# Patient Record
Sex: Female | Born: 1935
Health system: Southern US, Community
[De-identification: ages and names within clinical notes are randomized; demographics above are authoritative.]

## PROBLEM LIST (undated history)

## (undated) DIAGNOSIS — H409 Unspecified glaucoma: Secondary | ICD-10-CM

## (undated) DIAGNOSIS — D649 Anemia, unspecified: Secondary | ICD-10-CM

## (undated) DIAGNOSIS — R634 Abnormal weight loss: Secondary | ICD-10-CM

## (undated) DIAGNOSIS — R5383 Other fatigue: Secondary | ICD-10-CM

## (undated) DIAGNOSIS — K5790 Diverticulosis of intestine, part unspecified, without perforation or abscess without bleeding: Secondary | ICD-10-CM

## (undated) DIAGNOSIS — R0600 Dyspnea, unspecified: Secondary | ICD-10-CM

## (undated) DIAGNOSIS — F419 Anxiety disorder, unspecified: Secondary | ICD-10-CM

## (undated) DIAGNOSIS — R0681 Apnea, not elsewhere classified: Secondary | ICD-10-CM

## (undated) DIAGNOSIS — K219 Gastro-esophageal reflux disease without esophagitis: Secondary | ICD-10-CM

## (undated) DIAGNOSIS — G4733 Obstructive sleep apnea (adult) (pediatric): Secondary | ICD-10-CM

## (undated) DIAGNOSIS — I10 Essential (primary) hypertension: Secondary | ICD-10-CM

## (undated) DIAGNOSIS — M810 Age-related osteoporosis without current pathological fracture: Secondary | ICD-10-CM

## (undated) DIAGNOSIS — J383 Other diseases of vocal cords: Secondary | ICD-10-CM

## (undated) DIAGNOSIS — J45909 Unspecified asthma, uncomplicated: Secondary | ICD-10-CM

## (undated) DIAGNOSIS — E785 Hyperlipidemia, unspecified: Secondary | ICD-10-CM

## (undated) DIAGNOSIS — J3800 Paralysis of vocal cords and larynx, unspecified: Secondary | ICD-10-CM

## (undated) HISTORY — DX: Dyspnea, unspecified: R06.00

## (undated) HISTORY — DX: Hyperlipidemia, unspecified: E78.5

## (undated) HISTORY — DX: Gastro-esophageal reflux disease without esophagitis: K21.9

## (undated) HISTORY — DX: Anemia, unspecified: D64.9

## (undated) HISTORY — DX: Paralysis of vocal cords and larynx, unspecified: J38.00

## (undated) HISTORY — PX: TONSILLECTOMY: SUR1361

## (undated) HISTORY — DX: Anxiety disorder, unspecified: F41.9

## (undated) HISTORY — DX: Diverticulosis of intestine, part unspecified, without perforation or abscess without bleeding: K57.90

## (undated) HISTORY — DX: Other diseases of vocal cords: J38.3

## (undated) HISTORY — PX: SKIN CANCER EXCISION: SHX779

## (undated) HISTORY — DX: Age-related osteoporosis without current pathological fracture: M81.0

## (undated) HISTORY — DX: Obstructive sleep apnea (adult) (pediatric): G47.33

## (undated) HISTORY — DX: Abnormal weight loss: R63.4

## (undated) HISTORY — DX: Unspecified glaucoma: H40.9

## (undated) HISTORY — DX: Unspecified asthma, uncomplicated: J45.909

## (undated) HISTORY — PX: CERVICAL CONIZATION W/BX: SHX1330

## (undated) HISTORY — DX: Essential (primary) hypertension: I10

## (undated) HISTORY — DX: Apnea, not elsewhere classified: R06.81

## (undated) HISTORY — DX: Other fatigue: R53.83

---

## 1999-11-05 HISTORY — PX: OTHER SURGICAL HISTORY: SHX169

## 2000-01-03 LAB — HM SIGMOIDOSCOPY

## 2000-01-09 LAB — HM SIGMOIDOSCOPY

## 2000-05-04 DIAGNOSIS — R0681 Apnea, not elsewhere classified: Secondary | ICD-10-CM

## 2000-05-04 HISTORY — PX: UPPER GI ENDOSCOPY: SHX6162

## 2000-05-04 HISTORY — DX: Apnea, not elsewhere classified: R06.81

## 2000-05-08 ENCOUNTER — Other Ambulatory Visit: Admission: RE | Admit: 2000-05-08 | Discharge: 2000-05-08 | Payer: Self-pay | Admitting: Internal Medicine

## 2000-06-18 ENCOUNTER — Ambulatory Visit: Admission: RE | Admit: 2000-06-18 | Discharge: 2000-06-18 | Payer: Self-pay | Admitting: Internal Medicine

## 2001-11-04 HISTORY — PX: OTHER SURGICAL HISTORY: SHX169

## 2002-02-02 HISTORY — PX: COLONOSCOPY: SHX174

## 2002-02-02 LAB — HM COLONOSCOPY

## 2009-08-09 LAB — BASIC METABOLIC PANEL
BUN: 21 mg/dL (ref 4–21)
CREATININE: 1 mg/dL (ref 0.5–1.1)
Glucose: 98 mg/dL
POTASSIUM: 3.9 mmol/L (ref 3.4–5.3)
SODIUM: 140 mmol/L (ref 137–147)

## 2009-08-09 LAB — HEPATIC FUNCTION PANEL
ALT: 14 U/L (ref 7–35)
AST: 26 U/L (ref 13–35)
Alkaline Phosphatase: 90 U/L (ref 25–125)
BILIRUBIN, TOTAL: 0.5 mg/dL

## 2009-08-09 LAB — LIPID PANEL
Cholesterol: 217 mg/dL — AB (ref 0–200)
HDL: 98 mg/dL — AB (ref 35–70)
LDL Cholesterol: 103 mg/dL
TRIGLYCERIDES: 78 mg/dL (ref 40–160)

## 2009-08-09 LAB — CBC AND DIFFERENTIAL
HCT: 42 % (ref 36–46)
Hemoglobin: 13.9 g/dL (ref 12.0–16.0)
Platelets: 165 10*3/uL (ref 150–399)
WBC: 4.8 10*3/mL

## 2009-11-04 HISTORY — PX: CATARACT EXTRACTION W/ INTRAOCULAR LENS  IMPLANT, BILATERAL: SHX1307

## 2009-12-15 DIAGNOSIS — R0600 Dyspnea, unspecified: Secondary | ICD-10-CM

## 2009-12-15 HISTORY — DX: Dyspnea, unspecified: R06.00

## 2012-01-06 ENCOUNTER — Encounter: Payer: Self-pay | Admitting: Internal Medicine

## 2012-02-12 ENCOUNTER — Encounter: Payer: Self-pay | Admitting: Internal Medicine

## 2012-02-13 LAB — HM MAMMOGRAPHY: HM MAMMO: NEGATIVE

## 2012-04-03 ENCOUNTER — Encounter: Payer: Self-pay | Admitting: *Deleted

## 2012-04-08 ENCOUNTER — Encounter: Payer: Self-pay | Admitting: Internal Medicine

## 2012-04-22 ENCOUNTER — Ambulatory Visit: Payer: Self-pay | Admitting: Internal Medicine

## 2012-06-03 DIAGNOSIS — Z85828 Personal history of other malignant neoplasm of skin: Secondary | ICD-10-CM

## 2012-06-03 DIAGNOSIS — Z8709 Personal history of other diseases of the respiratory system: Secondary | ICD-10-CM | POA: Insufficient documentation

## 2012-06-03 DIAGNOSIS — H01009 Unspecified blepharitis unspecified eye, unspecified eyelid: Secondary | ICD-10-CM | POA: Insufficient documentation

## 2012-06-03 DIAGNOSIS — H3530 Unspecified macular degeneration: Secondary | ICD-10-CM | POA: Insufficient documentation

## 2012-06-03 DIAGNOSIS — Z961 Presence of intraocular lens: Secondary | ICD-10-CM | POA: Insufficient documentation

## 2012-06-03 HISTORY — DX: Personal history of other malignant neoplasm of skin: Z85.828

## 2012-06-03 HISTORY — DX: Personal history of other diseases of the respiratory system: Z87.09

## 2012-06-03 HISTORY — DX: Presence of intraocular lens: Z96.1

## 2012-06-25 ENCOUNTER — Encounter: Payer: Self-pay | Admitting: Internal Medicine

## 2012-06-25 ENCOUNTER — Ambulatory Visit (INDEPENDENT_AMBULATORY_CARE_PROVIDER_SITE_OTHER): Payer: Medicare Other | Admitting: Internal Medicine

## 2012-06-25 VITALS — Ht 65.0 in | Wt 137.0 lb

## 2012-06-25 DIAGNOSIS — Z1211 Encounter for screening for malignant neoplasm of colon: Secondary | ICD-10-CM

## 2012-06-25 NOTE — Patient Instructions (Addendum)
Follow up as needed with Dr Juanda Chance.

## 2012-06-25 NOTE — Progress Notes (Signed)
Angela [name] 15-Mar-1927 MRN [id number]   History of Present Illness:  This is an 78 year old white female who is here to discuss a screening colonoscopy. Her last exam in April 2003 showed mild diverticulosis of the sigmoid colon. She has a history of asthma and difficult intubation. She denies any GI symptoms. There is no family history of colon cancer.   Past Medical History  Diagnosis Date  . GERD (gastroesophageal reflux disease)   . Diverticulosis   . Vocal cord dysfunction     per ENT, pt NOT to have elective intubation without disscussing with him  . OSA (obstructive sleep apnea)   . Asthma   . Anxiety   . Hyperlipidemia   . Hypertension    Past Surgical History  Procedure Date  . Tonsillectomy   . Cervix surgery     due to cervical dysplasia  . Paralyzed larynx     does not have a smoking history on file. Her smokeless tobacco use includes Snuff. She reports that she does not drink alcohol or use illicit drugs. family history includes Melanoma in her mother.  There is no history of Colon cancer. No Known Allergies      Review of Systems: Weight has been stable. Negative for dysphagia odynophagia  The remainder of the 10 point ROS is negative except as outlined in H&P   Physical Exam: General appearance  Well developed, in no distress. Eyes- non icteric. HEENT nontraumatic, normocephalic. Mouth no lesions, tongue papillated, no cheilosis. Neck supple without adenopathy, thyroid not enlarged, no carotid bruits, no JVD. Lungs Clear to auscultation bilaterally. Cor normal S1, normal S2, regular rhythm, no murmur,  quiet precordium. Abdomen: Soft nontender abdomen with normoactive bowel sounds. No distention. Rectal: Soft Hemoccult negative stool. Extremities no pedal edema. Skin no lesions. Neurological alert and oriented x 3. Psychological normal mood and affect.  Assessment and Plan:  Problem #74 78 year old white female who is due for a screening  colonoscopy. She is in good health but has a history of obstructive sleep apnea and asthmatic bronchitis as well as difficult intubation. At this point, the risks of colonoscopy exceed the benefits. We have mutually agreed on not pursuing a routine screening colonoscopy.   06/25/2012 Angela [name]

## 2013-10-12 DIAGNOSIS — H02409 Unspecified ptosis of unspecified eyelid: Secondary | ICD-10-CM | POA: Insufficient documentation

## 2013-11-11 DIAGNOSIS — H02009 Unspecified entropion of unspecified eye, unspecified eyelid: Secondary | ICD-10-CM | POA: Diagnosis not present

## 2013-11-11 DIAGNOSIS — H16219 Exposure keratoconjunctivitis, unspecified eye: Secondary | ICD-10-CM | POA: Diagnosis not present

## 2013-11-11 DIAGNOSIS — H02429 Myogenic ptosis of unspecified eyelid: Secondary | ICD-10-CM | POA: Insufficient documentation

## 2013-11-11 DIAGNOSIS — H02049 Spastic entropion of unspecified eye, unspecified eyelid: Secondary | ICD-10-CM | POA: Diagnosis not present

## 2013-11-11 HISTORY — DX: Myogenic ptosis of unspecified eyelid: H02.429

## 2013-11-12 DIAGNOSIS — J40 Bronchitis, not specified as acute or chronic: Secondary | ICD-10-CM | POA: Diagnosis not present

## 2013-11-25 DIAGNOSIS — J209 Acute bronchitis, unspecified: Secondary | ICD-10-CM | POA: Diagnosis not present

## 2013-12-02 DIAGNOSIS — Z79899 Other long term (current) drug therapy: Secondary | ICD-10-CM | POA: Diagnosis not present

## 2013-12-02 DIAGNOSIS — D649 Anemia, unspecified: Secondary | ICD-10-CM | POA: Diagnosis not present

## 2013-12-02 DIAGNOSIS — E559 Vitamin D deficiency, unspecified: Secondary | ICD-10-CM | POA: Diagnosis not present

## 2013-12-02 DIAGNOSIS — E782 Mixed hyperlipidemia: Secondary | ICD-10-CM | POA: Diagnosis not present

## 2013-12-02 DIAGNOSIS — I10 Essential (primary) hypertension: Secondary | ICD-10-CM | POA: Diagnosis not present

## 2013-12-26 DIAGNOSIS — H168 Other keratitis: Secondary | ICD-10-CM

## 2013-12-26 DIAGNOSIS — H02049 Spastic entropion of unspecified eye, unspecified eyelid: Secondary | ICD-10-CM

## 2013-12-26 HISTORY — DX: Spastic entropion of unspecified eye, unspecified eyelid: H02.049

## 2013-12-26 HISTORY — DX: Other keratitis: H16.8

## 2013-12-27 DIAGNOSIS — H409 Unspecified glaucoma: Secondary | ICD-10-CM | POA: Diagnosis not present

## 2013-12-27 DIAGNOSIS — H1840 Unspecified corneal degeneration: Secondary | ICD-10-CM | POA: Diagnosis not present

## 2013-12-27 DIAGNOSIS — H02429 Myogenic ptosis of unspecified eyelid: Secondary | ICD-10-CM | POA: Diagnosis not present

## 2013-12-27 DIAGNOSIS — J329 Chronic sinusitis, unspecified: Secondary | ICD-10-CM | POA: Diagnosis not present

## 2013-12-27 DIAGNOSIS — H02009 Unspecified entropion of unspecified eye, unspecified eyelid: Secondary | ICD-10-CM | POA: Diagnosis not present

## 2013-12-27 DIAGNOSIS — J309 Allergic rhinitis, unspecified: Secondary | ICD-10-CM | POA: Diagnosis not present

## 2013-12-27 DIAGNOSIS — H02049 Spastic entropion of unspecified eye, unspecified eyelid: Secondary | ICD-10-CM | POA: Diagnosis not present

## 2013-12-27 DIAGNOSIS — Z7982 Long term (current) use of aspirin: Secondary | ICD-10-CM | POA: Diagnosis not present

## 2013-12-27 DIAGNOSIS — Z85828 Personal history of other malignant neoplasm of skin: Secondary | ICD-10-CM | POA: Diagnosis not present

## 2013-12-27 DIAGNOSIS — Z888 Allergy status to other drugs, medicaments and biological substances status: Secondary | ICD-10-CM | POA: Diagnosis not present

## 2013-12-27 DIAGNOSIS — H16219 Exposure keratoconjunctivitis, unspecified eye: Secondary | ICD-10-CM | POA: Diagnosis not present

## 2013-12-27 DIAGNOSIS — I10 Essential (primary) hypertension: Secondary | ICD-10-CM | POA: Diagnosis not present

## 2013-12-27 DIAGNOSIS — Z79899 Other long term (current) drug therapy: Secondary | ICD-10-CM | POA: Diagnosis not present

## 2013-12-27 DIAGNOSIS — H3530 Unspecified macular degeneration: Secondary | ICD-10-CM | POA: Diagnosis not present

## 2013-12-27 DIAGNOSIS — R0602 Shortness of breath: Secondary | ICD-10-CM | POA: Diagnosis not present

## 2014-02-09 DIAGNOSIS — H409 Unspecified glaucoma: Secondary | ICD-10-CM | POA: Diagnosis not present

## 2014-02-09 DIAGNOSIS — H4011X0 Primary open-angle glaucoma, stage unspecified: Secondary | ICD-10-CM | POA: Diagnosis not present

## 2014-02-09 DIAGNOSIS — Z961 Presence of intraocular lens: Secondary | ICD-10-CM | POA: Diagnosis not present

## 2014-02-09 DIAGNOSIS — H4010X0 Unspecified open-angle glaucoma, stage unspecified: Secondary | ICD-10-CM | POA: Diagnosis not present

## 2014-02-09 DIAGNOSIS — H3530 Unspecified macular degeneration: Secondary | ICD-10-CM | POA: Diagnosis not present

## 2014-02-09 DIAGNOSIS — H01009 Unspecified blepharitis unspecified eye, unspecified eyelid: Secondary | ICD-10-CM | POA: Diagnosis not present

## 2014-02-14 DIAGNOSIS — J3802 Paralysis of vocal cords and larynx, bilateral: Secondary | ICD-10-CM | POA: Diagnosis not present

## 2014-02-21 DIAGNOSIS — H02009 Unspecified entropion of unspecified eye, unspecified eyelid: Secondary | ICD-10-CM

## 2014-02-21 HISTORY — DX: Unspecified entropion of unspecified eye, unspecified eyelid: H02.009

## 2014-06-06 DIAGNOSIS — M810 Age-related osteoporosis without current pathological fracture: Secondary | ICD-10-CM | POA: Diagnosis not present

## 2014-06-06 DIAGNOSIS — I10 Essential (primary) hypertension: Secondary | ICD-10-CM | POA: Diagnosis not present

## 2014-06-06 DIAGNOSIS — Z79899 Other long term (current) drug therapy: Secondary | ICD-10-CM | POA: Diagnosis not present

## 2014-06-06 DIAGNOSIS — E782 Mixed hyperlipidemia: Secondary | ICD-10-CM | POA: Diagnosis not present

## 2014-06-06 DIAGNOSIS — D649 Anemia, unspecified: Secondary | ICD-10-CM | POA: Diagnosis not present

## 2014-06-06 LAB — HM DEXA SCAN

## 2014-06-09 DIAGNOSIS — H4011X0 Primary open-angle glaucoma, stage unspecified: Secondary | ICD-10-CM | POA: Diagnosis not present

## 2014-06-09 DIAGNOSIS — H353190 Nonexudative age-related macular degeneration, unspecified eye, stage unspecified: Secondary | ICD-10-CM | POA: Diagnosis not present

## 2014-06-09 DIAGNOSIS — H43819 Vitreous degeneration, unspecified eye: Secondary | ICD-10-CM | POA: Diagnosis not present

## 2014-06-09 DIAGNOSIS — H11159 Pinguecula, unspecified eye: Secondary | ICD-10-CM | POA: Diagnosis not present

## 2014-08-17 DIAGNOSIS — Z961 Presence of intraocular lens: Secondary | ICD-10-CM | POA: Diagnosis not present

## 2014-08-17 DIAGNOSIS — H4010X3 Unspecified open-angle glaucoma, severe stage: Secondary | ICD-10-CM | POA: Diagnosis not present

## 2014-08-17 DIAGNOSIS — H26499 Other secondary cataract, unspecified eye: Secondary | ICD-10-CM | POA: Insufficient documentation

## 2014-08-17 DIAGNOSIS — H26491 Other secondary cataract, right eye: Secondary | ICD-10-CM | POA: Diagnosis not present

## 2014-08-17 DIAGNOSIS — H3530 Unspecified macular degeneration: Secondary | ICD-10-CM | POA: Diagnosis not present

## 2014-08-19 DIAGNOSIS — Z23 Encounter for immunization: Secondary | ICD-10-CM | POA: Diagnosis not present

## 2014-08-31 DIAGNOSIS — H4010X3 Unspecified open-angle glaucoma, severe stage: Secondary | ICD-10-CM | POA: Diagnosis not present

## 2014-09-05 DIAGNOSIS — Z1231 Encounter for screening mammogram for malignant neoplasm of breast: Secondary | ICD-10-CM | POA: Diagnosis not present

## 2014-11-23 DIAGNOSIS — Z79899 Other long term (current) drug therapy: Secondary | ICD-10-CM | POA: Diagnosis not present

## 2014-11-23 DIAGNOSIS — E782 Mixed hyperlipidemia: Secondary | ICD-10-CM | POA: Diagnosis not present

## 2014-11-23 DIAGNOSIS — I10 Essential (primary) hypertension: Secondary | ICD-10-CM | POA: Diagnosis not present

## 2014-11-23 DIAGNOSIS — E559 Vitamin D deficiency, unspecified: Secondary | ICD-10-CM | POA: Diagnosis not present

## 2014-11-23 DIAGNOSIS — F419 Anxiety disorder, unspecified: Secondary | ICD-10-CM | POA: Diagnosis not present

## 2014-11-23 DIAGNOSIS — D649 Anemia, unspecified: Secondary | ICD-10-CM | POA: Diagnosis not present

## 2014-12-21 DIAGNOSIS — H3530 Unspecified macular degeneration: Secondary | ICD-10-CM | POA: Diagnosis not present

## 2014-12-21 DIAGNOSIS — H4011X3 Primary open-angle glaucoma, severe stage: Secondary | ICD-10-CM | POA: Diagnosis not present

## 2014-12-21 DIAGNOSIS — H4011X2 Primary open-angle glaucoma, moderate stage: Secondary | ICD-10-CM | POA: Diagnosis not present

## 2014-12-21 DIAGNOSIS — Z961 Presence of intraocular lens: Secondary | ICD-10-CM | POA: Diagnosis not present

## 2014-12-21 DIAGNOSIS — H401190 Primary open-angle glaucoma, unspecified eye, stage unspecified: Secondary | ICD-10-CM | POA: Insufficient documentation

## 2014-12-21 DIAGNOSIS — H4010X3 Unspecified open-angle glaucoma, severe stage: Secondary | ICD-10-CM | POA: Diagnosis not present

## 2015-02-20 DIAGNOSIS — J383 Other diseases of vocal cords: Secondary | ICD-10-CM | POA: Diagnosis not present

## 2015-02-20 DIAGNOSIS — J3802 Paralysis of vocal cords and larynx, bilateral: Secondary | ICD-10-CM | POA: Diagnosis not present

## 2015-02-20 DIAGNOSIS — J384 Edema of larynx: Secondary | ICD-10-CM | POA: Diagnosis not present

## 2015-04-26 DIAGNOSIS — Z961 Presence of intraocular lens: Secondary | ICD-10-CM | POA: Diagnosis not present

## 2015-04-26 DIAGNOSIS — H4011X2 Primary open-angle glaucoma, moderate stage: Secondary | ICD-10-CM | POA: Diagnosis not present

## 2015-04-26 DIAGNOSIS — H4011X3 Primary open-angle glaucoma, severe stage: Secondary | ICD-10-CM | POA: Diagnosis not present

## 2015-04-26 DIAGNOSIS — H3530 Unspecified macular degeneration: Secondary | ICD-10-CM | POA: Diagnosis not present

## 2015-05-31 DIAGNOSIS — F419 Anxiety disorder, unspecified: Secondary | ICD-10-CM | POA: Diagnosis not present

## 2015-05-31 DIAGNOSIS — Z79899 Other long term (current) drug therapy: Secondary | ICD-10-CM | POA: Diagnosis not present

## 2015-05-31 DIAGNOSIS — I10 Essential (primary) hypertension: Secondary | ICD-10-CM | POA: Diagnosis not present

## 2015-07-06 DIAGNOSIS — R5383 Other fatigue: Secondary | ICD-10-CM

## 2015-07-06 HISTORY — DX: Other fatigue: R53.83

## 2015-08-02 DIAGNOSIS — Z23 Encounter for immunization: Secondary | ICD-10-CM | POA: Diagnosis not present

## 2015-08-08 DIAGNOSIS — Z23 Encounter for immunization: Secondary | ICD-10-CM | POA: Diagnosis not present

## 2015-09-06 DIAGNOSIS — H401122 Primary open-angle glaucoma, left eye, moderate stage: Secondary | ICD-10-CM | POA: Diagnosis not present

## 2015-09-06 DIAGNOSIS — Z961 Presence of intraocular lens: Secondary | ICD-10-CM | POA: Diagnosis not present

## 2015-09-06 DIAGNOSIS — H3530 Unspecified macular degeneration: Secondary | ICD-10-CM | POA: Diagnosis not present

## 2015-09-06 DIAGNOSIS — H26491 Other secondary cataract, right eye: Secondary | ICD-10-CM | POA: Diagnosis not present

## 2015-09-06 DIAGNOSIS — H401113 Primary open-angle glaucoma, right eye, severe stage: Secondary | ICD-10-CM | POA: Diagnosis not present

## 2015-09-21 ENCOUNTER — Non-Acute Institutional Stay: Payer: Medicare Other | Admitting: Nurse Practitioner

## 2015-09-21 ENCOUNTER — Encounter: Payer: Self-pay | Admitting: Nurse Practitioner

## 2015-09-21 VITALS — BP 130/76 | HR 76 | Temp 98.1°F | Ht 64.2 in | Wt 131.0 lb

## 2015-09-21 DIAGNOSIS — M810 Age-related osteoporosis without current pathological fracture: Secondary | ICD-10-CM

## 2015-09-21 DIAGNOSIS — H409 Unspecified glaucoma: Secondary | ICD-10-CM | POA: Diagnosis not present

## 2015-09-21 DIAGNOSIS — G4733 Obstructive sleep apnea (adult) (pediatric): Secondary | ICD-10-CM

## 2015-09-21 DIAGNOSIS — I10 Essential (primary) hypertension: Secondary | ICD-10-CM | POA: Diagnosis not present

## 2015-09-21 DIAGNOSIS — M159 Polyosteoarthritis, unspecified: Secondary | ICD-10-CM

## 2015-09-21 DIAGNOSIS — F418 Other specified anxiety disorders: Secondary | ICD-10-CM | POA: Diagnosis not present

## 2015-09-21 DIAGNOSIS — E785 Hyperlipidemia, unspecified: Secondary | ICD-10-CM | POA: Diagnosis not present

## 2015-09-21 DIAGNOSIS — M150 Primary generalized (osteo)arthritis: Secondary | ICD-10-CM | POA: Diagnosis not present

## 2015-09-21 DIAGNOSIS — R29818 Other symptoms and signs involving the nervous system: Secondary | ICD-10-CM | POA: Diagnosis not present

## 2015-09-21 DIAGNOSIS — R2689 Other abnormalities of gait and mobility: Secondary | ICD-10-CM

## 2015-09-21 DIAGNOSIS — J040 Acute laryngitis: Secondary | ICD-10-CM | POA: Diagnosis not present

## 2015-09-21 DIAGNOSIS — J3802 Paralysis of vocal cords and larynx, bilateral: Secondary | ICD-10-CM

## 2015-09-21 DIAGNOSIS — J309 Allergic rhinitis, unspecified: Secondary | ICD-10-CM | POA: Diagnosis not present

## 2015-09-21 DIAGNOSIS — R42 Dizziness and giddiness: Secondary | ICD-10-CM | POA: Diagnosis not present

## 2015-09-21 DIAGNOSIS — K219 Gastro-esophageal reflux disease without esophagitis: Secondary | ICD-10-CM

## 2015-09-21 NOTE — Progress Notes (Signed)
Patient ID: Angela [name], female   DOB: 1927/04/16, 81 y.o.   MRN: [id number]  Location:  clinic [place] Provider:  Marlana Latus NP  Code Status:  DNR Goals of care: Advanced Directive information Does patient have an advance directive?: Yes, Type of Advance Directive: [place];Living will  Chief Complaint  Patient presents with  . Medical Management of Chronic Issues    New Patient  Moved to [facility] 09/03/15. Here with daughter Angela [name]  . balance    wants to take yoga class, concern about her osteoporosis  . Dizziness    hadn't had much to drink or eat  . Knee Pain    no swelling     HPI: Patient is a 81 y.o. female seen in the clinic at [facility] today for evaluation of c/o anxiety, dizziness, poor balance, osteoporosis.  Review of Systems  Constitutional: Negative for fever, chills, weight loss, malaise/fatigue and diaphoresis.  HENT: Positive for hearing loss. Negative for congestion, ear discharge, ear pain, nosebleeds, sore throat and tinnitus.   Eyes: Negative for blurred vision, double vision, photophobia, pain, discharge and redness.       R+L glaucoma  Respiratory: Negative for cough, hemoptysis, sputum production, shortness of breath, wheezing and stridor.   Cardiovascular: Negative for chest pain, palpitations, orthopnea, claudication, leg swelling and PND.  Gastrointestinal: Negative for heartburn, nausea, vomiting, abdominal pain, diarrhea, constipation, blood in stool and melena.  Genitourinary: Negative for dysuria, urgency, frequency, hematuria and flank pain.  Musculoskeletal: Positive for joint pain. Negative for myalgias, back pain, falls and neck pain.       Left knee pain  Skin: Negative for itching and rash.  Neurological: Positive for dizziness. Negative for tingling, tremors, sensory change, speech change, focal weakness, seizures, loss of consciousness, weakness and headaches.  Endo/Heme/Allergies: Negative for environmental allergies  and polydipsia. Does not bruise/bleed easily.  Psychiatric/Behavioral: Positive for depression and memory loss. Negative for suicidal ideas, hallucinations and substance abuse. The patient is nervous/anxious. The patient does not have insomnia.     Past Medical History  Diagnosis Date  . GERD (gastroesophageal reflux disease)   . Diverticulosis   . Vocal cord dysfunction     per ENT, pt NOT to have elective intubation without disscussing with him  . OSA (obstructive sleep apnea)   . Asthma   . Anxiety   . Hyperlipidemia   . Hypertension   . Glaucoma     both eyes  . Osteoporosis, senile   . Anemia   . Fatigue 07/2015  . Dyspnea 12/15/09    spirometer normal & function actually worsens with nebk may have more of a "exercise-induced" type of bronchospasm   . Apnea 05/2000    sleep study revealed mild upper airway obstruction & apnea during speel, no set recommendations for therapy    Patient Active Problem List   Diagnosis Date Noted  . Osteoporosis 09/22/2015  . GERD (gastroesophageal reflux disease) 09/22/2015  . Obstructive sleep apnea 09/22/2015  . Reflux laryngitis 09/22/2015  . Depression with anxiety 09/22/2015  . HTN (hypertension) 09/22/2015  . Glaucoma 09/22/2015  . Osteoarthritis 09/22/2015  . Balance problem 09/22/2015  . Dizziness and giddiness 09/22/2015  . Hyperlipidemia 09/22/2015  . Allergic rhinitis 09/22/2015  . Vocal fold paralysis, bilateral 09/22/2015    No Known Allergies  Medications: Patient's Medications  New Prescriptions   No medications on file  Previous Medications   ARTIFICIAL TEAR OINTMENT (LUBRICANT EYE) OINT    Apply to eye.  Apply both eyes at bedtime for dry eyes   ASPIRIN 81 MG TABLET    Take 81 mg by mouth daily.   BIMATOPROST (LUMIGAN) 0.03 % OPHTHALMIC SOLUTION    Place 1 drop into the right eye at bedtime.   BRINZOLAMIDE (AZOPT) 1 % OPHTHALMIC SUSPENSION    Place 1 drop into both eyes 2 (two) times daily.   CALCIUM  CARB-CHOLECALCIFEROL (CALCIUM 600+D3) 600-800 MG-UNIT TABS    Take by mouth. Take one twice daily   CETIRIZINE (KLS ALLER-TEC) 10 MG TABLET    Take 10 mg by mouth daily. As needed for allergies   LOSARTAN (COZAAR) 50 MG TABLET    Take 50 mg by mouth. Take one tablet daily for blood pressure   NON FORMULARY    Super [employer] Enzmes take one twice daily after meal for gas   OMEGA-3 FATTY ACIDS (FISH OIL) 1000 MG CAPS    Take by mouth. Take one daily   POLYETHYL GLYCOL-PROPYL GLYCOL (SYSTANE ULTRA PF) 0.4-0.3 % SOLN    1 drop. One drop in both eyes as needed for dry eyes   PROBIOTIC PRODUCT (PROBIOTIC DAILY PO)    Take by mouth. One twice daily   SIMVASTATIN (ZOCOR) 10 MG TABLET    Take 10 mg by mouth at bedtime.   TIMOLOL MALEATE 0.5 % (DAILY) SOLN    1 drop. One drop in both eyes twice daily   VITAMIN D, CHOLECALCIFEROL, 1000 UNITS CAPS    Take by mouth. Take one twice daily  Modified Medications   No medications on file  Discontinued Medications   CO-ENZYME Q-10 30 MG CAPSULE    Take 30 mg by mouth 3 (three) times daily.   LOSARTAN-HYDROCHLOROTHIAZIDE (HYZAAR) 100-12.5 MG PER TABLET    Take 1 tablet by mouth daily.   NIACIN (NIASPAN) 1000 MG CR TABLET    Take 1,000 mg by mouth at bedtime.   SERTRALINE (ZOLOFT) 100 MG TABLET    Take 100 mg by mouth daily.    Physical Exam: Filed Vitals:   09/21/15 1609  BP: 130/76  Pulse: 76  Temp: 98.1 F (36.7 C)  TempSrc: Oral  Height: 5' 4.2" (1.631 m)  Weight: 131 lb (59.421 kg)  SpO2: 94%   Body mass index is 22.34 kg/(m^2).  Physical Exam  Constitutional: She is oriented to person, place, and time. She appears well-developed and well-nourished.  HENT:  Head: Normocephalic and atraumatic.  Right Ear: External ear normal.  Left Ear: External ear normal.  Nose: Nose normal.  Mouth/Throat: Oropharynx is clear and moist. She has dentures.  Torus palatinus.   Eyes: EOM are normal. Pupils are equal, round, and reactive to light. Right eye  exhibits no discharge. Left eye exhibits no discharge. No scleral icterus.  Neck: Normal range of motion. Neck supple. No JVD present. No tracheal deviation present. No thyromegaly present.  Cardiovascular: Normal rate, regular rhythm, normal heart sounds and intact distal pulses.  Exam reveals no gallop and no friction rub.   No murmur heard. Pulmonary/Chest: No stridor. No respiratory distress. She has no wheezes. She has no rales. She exhibits no tenderness.  Abdominal: Soft. Bowel sounds are normal. She exhibits no distension and no mass. There is no tenderness. There is no rebound and no guarding. No hernia.  Genitourinary: Vagina normal. Guaiac negative stool. No vaginal discharge found.  Musculoskeletal: Normal range of motion. She exhibits tenderness. She exhibits no edema.  Chronic knee pain, more on the left.   Lymphadenopathy:  She has no cervical adenopathy.  Neurological: She is alert and oriented to person, place, and time. She has normal reflexes. She displays normal reflexes. No cranial nerve deficit. She exhibits normal muscle tone. Coordination normal.  Skin: Skin is warm and dry. No rash noted. No erythema. No pallor.  Psychiatric: Her behavior is normal. Judgment and thought content normal.  Anxious, adjusting.     Labs reviewed: Basic Metabolic Panel: No results for input(s): NA, K, CL, CO2, GLUCOSE, BUN, CREATININE, CALCIUM, MG, PHOS in the last 8760 hours.  Liver Function Tests: No results for input(s): AST, ALT, ALKPHOS, BILITOT, PROT, ALBUMIN in the last 8760 hours.  CBC: No results for input(s): WBC, NEUTROABS, HGB, HCT, MCV, PLT in the last 8760 hours.  No results found for: TSH No results found for: HGBA1C Lab Results  Component Value Date   CHOL 217* 08/09/2009   HDL 98* 08/09/2009   LDLCALC 103 08/09/2009   TRIG 78 08/09/2009    Significant Diagnostic Results since last visit: none  Patient Care Team: Estill Dooms, MD as PCP - General  (Internal Medicine) Trudi Morgenthaler X, NP as Nurse Practitioner (Internal Medicine) Gwenyth Ober, MD as Referring Physician (Ophthalmology)  Assessment/Plan Problem List Items Addressed This Visit    Osteoporosis - Primary    06/06/14 BMD T -2.7, off Alendronate 2nd to jaw bone necrotic process, current taking Vit D, Ca, will update Vit D level. Due 06/2016      Relevant Medications   Vitamin D, Cholecalciferol, 1000 UNITS CAPS   Calcium Carb-Cholecalciferol (CALCIUM 600+D3) 600-800 MG-UNIT TABS   GERD (gastroesophageal reflux disease)    Off meds. Hx of Nexium/Protonix.       Relevant Medications   Probiotic Product (PROBIOTIC DAILY PO)   Obstructive sleep apnea    Underwent neurology eval, done sleep study, dx mild upper airway obstruction and apnea. No set recommended.      Reflux laryngitis    Underwent naso laryngoscopy, ENT        Depression with anxiety    The patient appears anxious, difficulty focusing during today's visit, but she stated she sleeps and eats well, difficulty adjusting to [facility], moved in about 2 weeks ago, but not crying or anger. Will obtain TSH, CBC, CMP, Hgb A1c      HTN (hypertension)    Controlled, continue Losartan. Update CMP      Relevant Medications   losartan (COZAAR) 50 MG tablet   Glaucoma    R+L, seeing Ophthalmology regularly.       Relevant Medications   Polyethyl Glycol-Propyl Glycol (SYSTANE ULTRA PF) 0.4-0.3 % SOLN   Timolol Maleate 0.5 % (DAILY) SOLN   Artificial Tear Ointment (LUBRICANT EYE) OINT   Osteoarthritis    Multiple sites, R+L knees, fingers      Balance problem    Not sure related to generalized weakness, frailty, or neurological etiology. PT to eval and tx      Dizziness and giddiness    Hx of it, none recently, her dtr said it seems caused by lack of oral fluid intake or anxiety. The patient stated it was not associated with position changes.       Hyperlipidemia    Continue Statin, update lipid panel.         Relevant Medications   losartan (COZAAR) 50 MG tablet   Allergic rhinitis    Stable, presently.       Vocal fold paralysis, bilateral    Needs a size 4.0 ETT  should she ever require intubation per Dr. Pricilla Riffle, ENT, [facility]          Family/ staff Communication: PT to eval and tx for balance issue  Labs/tests ordered:  CBC, CMP, TSH, Hgb A1c, Vit D, lipid panel.   [facility] Tarun Patchell NP Geriatrics [facility] [phone], [place] [address] On Call:  [phone] & follow prompts after 5pm & weekends Office Phone:  [phone] Office Fax:  [phone]

## 2015-09-22 DIAGNOSIS — J3802 Paralysis of vocal cords and larynx, bilateral: Secondary | ICD-10-CM | POA: Insufficient documentation

## 2015-09-22 DIAGNOSIS — J309 Allergic rhinitis, unspecified: Secondary | ICD-10-CM

## 2015-09-22 DIAGNOSIS — I10 Essential (primary) hypertension: Secondary | ICD-10-CM | POA: Insufficient documentation

## 2015-09-22 DIAGNOSIS — M1990 Unspecified osteoarthritis, unspecified site: Secondary | ICD-10-CM

## 2015-09-22 DIAGNOSIS — M810 Age-related osteoporosis without current pathological fracture: Secondary | ICD-10-CM | POA: Insufficient documentation

## 2015-09-22 DIAGNOSIS — E785 Hyperlipidemia, unspecified: Secondary | ICD-10-CM | POA: Insufficient documentation

## 2015-09-22 DIAGNOSIS — J040 Acute laryngitis: Secondary | ICD-10-CM

## 2015-09-22 DIAGNOSIS — G4733 Obstructive sleep apnea (adult) (pediatric): Secondary | ICD-10-CM

## 2015-09-22 DIAGNOSIS — M818 Other osteoporosis without current pathological fracture: Secondary | ICD-10-CM

## 2015-09-22 DIAGNOSIS — R42 Dizziness and giddiness: Secondary | ICD-10-CM | POA: Insufficient documentation

## 2015-09-22 DIAGNOSIS — K219 Gastro-esophageal reflux disease without esophagitis: Secondary | ICD-10-CM | POA: Insufficient documentation

## 2015-09-22 DIAGNOSIS — F418 Other specified anxiety disorders: Secondary | ICD-10-CM | POA: Insufficient documentation

## 2015-09-22 DIAGNOSIS — R2689 Other abnormalities of gait and mobility: Secondary | ICD-10-CM | POA: Insufficient documentation

## 2015-09-22 HISTORY — DX: Paralysis of vocal cords and larynx, bilateral: J38.02

## 2015-09-22 HISTORY — DX: Obstructive sleep apnea (adult) (pediatric): G47.33

## 2015-09-22 HISTORY — DX: Unspecified osteoarthritis, unspecified site: M19.90

## 2015-09-22 HISTORY — DX: Acute laryngitis: J04.0

## 2015-09-22 HISTORY — DX: Other osteoporosis without current pathological fracture: M81.8

## 2015-09-22 HISTORY — DX: Essential (primary) hypertension: I10

## 2015-09-22 HISTORY — DX: Allergic rhinitis, unspecified: J30.9

## 2015-09-22 NOTE — Assessment & Plan Note (Signed)
Stable, presently.

## 2015-09-22 NOTE — Assessment & Plan Note (Signed)
Underwent naso laryngoscopy, ENT

## 2015-09-22 NOTE — Assessment & Plan Note (Addendum)
Controlled, continue Losartan. Update CMP

## 2015-09-22 NOTE — Assessment & Plan Note (Signed)
Multiple sites, R+L knees, fingers

## 2015-09-22 NOTE — Assessment & Plan Note (Signed)
Continue Statin, update lipid panel.

## 2015-09-22 NOTE — Assessment & Plan Note (Signed)
R+L, seeing Ophthalmology regularly.

## 2015-09-22 NOTE — Assessment & Plan Note (Addendum)
06/06/14 BMD T -2.7, off Alendronate 2nd to jaw bone necrotic process, current taking Vit D, Ca, will update Vit D level. Due 06/2016

## 2015-09-22 NOTE — Assessment & Plan Note (Addendum)
The patient appears anxious, difficulty focusing during today's visit, but she stated she sleeps and eats well, difficulty adjusting to [facility], moved in about 2 weeks ago, but not crying or anger. Will obtain TSH, CBC, CMP, Hgb A1c

## 2015-09-22 NOTE — Assessment & Plan Note (Signed)
Needs a size 4.0 ETT should she ever require intubation per Dr. Pricilla Riffle, ENT, [facility]

## 2015-09-22 NOTE — Assessment & Plan Note (Signed)
Underwent neurology eval, done sleep study, dx mild upper airway obstruction and apnea. No set recommended.

## 2015-09-22 NOTE — Assessment & Plan Note (Signed)
Off meds. Hx of Nexium/Protonix.

## 2015-09-22 NOTE — Assessment & Plan Note (Signed)
Hx of it, none recently, her dtr said it seems caused by lack of oral fluid intake or anxiety. The patient stated it was not associated with position changes.

## 2015-09-22 NOTE — Assessment & Plan Note (Signed)
Not sure related to generalized weakness, frailty, or neurological etiology. PT to eval and tx

## 2015-10-03 DIAGNOSIS — R2681 Unsteadiness on feet: Secondary | ICD-10-CM | POA: Diagnosis not present

## 2015-10-03 DIAGNOSIS — M6281 Muscle weakness (generalized): Secondary | ICD-10-CM | POA: Diagnosis not present

## 2015-10-05 DIAGNOSIS — M6281 Muscle weakness (generalized): Secondary | ICD-10-CM | POA: Diagnosis not present

## 2015-10-05 DIAGNOSIS — R2681 Unsteadiness on feet: Secondary | ICD-10-CM | POA: Diagnosis not present

## 2015-10-09 DIAGNOSIS — M6281 Muscle weakness (generalized): Secondary | ICD-10-CM | POA: Diagnosis not present

## 2015-10-09 DIAGNOSIS — R2681 Unsteadiness on feet: Secondary | ICD-10-CM | POA: Diagnosis not present

## 2015-10-11 ENCOUNTER — Encounter: Payer: Self-pay | Admitting: Internal Medicine

## 2015-10-11 ENCOUNTER — Other Ambulatory Visit: Payer: Self-pay | Admitting: Internal Medicine

## 2015-10-11 DIAGNOSIS — R2681 Unsteadiness on feet: Secondary | ICD-10-CM | POA: Diagnosis not present

## 2015-10-11 DIAGNOSIS — M6281 Muscle weakness (generalized): Secondary | ICD-10-CM | POA: Diagnosis not present

## 2015-10-13 DIAGNOSIS — M6281 Muscle weakness (generalized): Secondary | ICD-10-CM | POA: Diagnosis not present

## 2015-10-13 DIAGNOSIS — R2681 Unsteadiness on feet: Secondary | ICD-10-CM | POA: Diagnosis not present

## 2015-10-16 DIAGNOSIS — R2681 Unsteadiness on feet: Secondary | ICD-10-CM | POA: Diagnosis not present

## 2015-10-16 DIAGNOSIS — M6281 Muscle weakness (generalized): Secondary | ICD-10-CM | POA: Diagnosis not present

## 2015-10-18 DIAGNOSIS — M6281 Muscle weakness (generalized): Secondary | ICD-10-CM | POA: Diagnosis not present

## 2015-10-18 DIAGNOSIS — R2681 Unsteadiness on feet: Secondary | ICD-10-CM | POA: Diagnosis not present

## 2015-10-20 DIAGNOSIS — R2681 Unsteadiness on feet: Secondary | ICD-10-CM | POA: Diagnosis not present

## 2015-10-20 DIAGNOSIS — M6281 Muscle weakness (generalized): Secondary | ICD-10-CM | POA: Diagnosis not present

## 2015-10-23 DIAGNOSIS — M6281 Muscle weakness (generalized): Secondary | ICD-10-CM | POA: Diagnosis not present

## 2015-10-23 DIAGNOSIS — R2681 Unsteadiness on feet: Secondary | ICD-10-CM | POA: Diagnosis not present

## 2015-10-25 DIAGNOSIS — R2681 Unsteadiness on feet: Secondary | ICD-10-CM | POA: Diagnosis not present

## 2015-10-25 DIAGNOSIS — M6281 Muscle weakness (generalized): Secondary | ICD-10-CM | POA: Diagnosis not present

## 2015-11-30 DIAGNOSIS — I10 Essential (primary) hypertension: Secondary | ICD-10-CM | POA: Diagnosis not present

## 2015-11-30 LAB — CBC AND DIFFERENTIAL
HCT: 42 % (ref 36–46)
Hemoglobin: 13.6 g/dL (ref 12.0–16.0)
Platelets: 203 10*3/uL (ref 150–399)
WBC: 5.4 10*3/mL

## 2015-11-30 LAB — BASIC METABOLIC PANEL
BUN: 24 mg/dL — AB (ref 4–21)
CREATININE: 0.9 mg/dL (ref 0.5–1.1)
Glucose: 97 mg/dL
POTASSIUM: 4.3 mmol/L (ref 3.4–5.3)
Sodium: 141 mmol/L (ref 137–147)

## 2015-11-30 LAB — LIPID PANEL
CHOLESTEROL: 186 mg/dL (ref 0–200)
HDL: 63 mg/dL (ref 35–70)
LDL Cholesterol: 94 mg/dL
Triglycerides: 145 mg/dL (ref 40–160)

## 2015-11-30 LAB — HEPATIC FUNCTION PANEL
ALT: 10 U/L (ref 7–35)
AST: 18 U/L (ref 13–35)
Alkaline Phosphatase: 83 U/L (ref 25–125)
Bilirubin, Total: 0.5 mg/dL

## 2015-11-30 LAB — TSH: TSH: 2.38 u[IU]/mL (ref 0.41–5.90)

## 2015-12-01 ENCOUNTER — Encounter: Payer: Self-pay | Admitting: *Deleted

## 2015-12-07 ENCOUNTER — Non-Acute Institutional Stay: Payer: Medicare Other | Admitting: Internal Medicine

## 2015-12-07 ENCOUNTER — Encounter: Payer: Self-pay | Admitting: Internal Medicine

## 2015-12-07 VITALS — BP 144/90 | HR 68 | Temp 98.0°F | Ht 64.0 in | Wt 129.0 lb

## 2015-12-07 DIAGNOSIS — R2689 Other abnormalities of gait and mobility: Secondary | ICD-10-CM

## 2015-12-07 DIAGNOSIS — I10 Essential (primary) hypertension: Secondary | ICD-10-CM

## 2015-12-07 DIAGNOSIS — E785 Hyperlipidemia, unspecified: Secondary | ICD-10-CM | POA: Diagnosis not present

## 2015-12-07 DIAGNOSIS — R29818 Other symptoms and signs involving the nervous system: Secondary | ICD-10-CM

## 2015-12-07 DIAGNOSIS — F419 Anxiety disorder, unspecified: Secondary | ICD-10-CM | POA: Diagnosis not present

## 2015-12-07 DIAGNOSIS — G4733 Obstructive sleep apnea (adult) (pediatric): Secondary | ICD-10-CM | POA: Diagnosis not present

## 2015-12-08 DIAGNOSIS — F419 Anxiety disorder, unspecified: Secondary | ICD-10-CM | POA: Insufficient documentation

## 2015-12-08 NOTE — Progress Notes (Signed)
Patient ID: Angela [name], female   DOB: [birth year], 82 y.o.   MRN: [id number]    [facility]     [facility] Clinic (12[facility]     Allergies  Allergen Reactions  . Brimonidine Other (See Comments)  . Dorzolamide Hcl Itching    EYE PAIN    Chief Complaint  Patient presents with  . Medical Management of Chronic Issues    blood pressure, depression, review lab. Here with daughter [name]    HPI:  Patient is here to establish herself with [employer] care. She has been seen previously by [facility] Mast, NP.  Essential hypertension - controlled  Obstructive sleep apnea - prior diagnosis. CPAP was not been recommended.  Hyperlipidemia - follow-up at future visit   Balance problem - no falls.  Her daughter has concerns about patient having a dysphoric mood and not going to activities as was hoped. Patient says she is going to exercise classes a week and needs some time to herself to attend the things that she must get done. It takes her longer than average to apply eyedrops for her glaucoma. She takes her time with doing laundry and other things.  Exploration of her life over the last year indicates that there've been multiple changes. A nephew died with a heart condition. Her dog got cancer and died. She had to give where Before she moved to [employer]. She has moved out of her home and a smaller apartment and [place]. Patient had cared for herself for over 20 years in her own home. She has had to give up her church and is making new friends in [employer]. There've been a lot of changes in her accounts, finances, banking, etc. She worries about the financial expense of living at [facility], but overall feels that it was the right move for her.  Medications: Patient's Medications  New Prescriptions   No medications on file  Previous Medications   ARTIFICIAL TEAR OINTMENT (LUBRICANT EYE) OINT    Apply to eye. Apply  both eyes at bedtime for dry eyes   ASPIRIN 81 MG TABLET    Take 81 mg by mouth daily.   BIMATOPROST (LUMIGAN) 0.03 % OPHTHALMIC SOLUTION    Place 1 drop into the right eye at bedtime.   BRINZOLAMIDE (AZOPT) 1 % OPHTHALMIC SUSPENSION    Place 1 drop into both eyes 2 (two) times daily.   CALCIUM CARB-CHOLECALCIFEROL (CALCIUM 600+D3) 600-800 MG-UNIT TABS    Take by mouth. Take one twice daily   LOSARTAN (COZAAR) 50 MG TABLET    Take 50 mg by mouth. Take one tablet daily for blood pressure   OMEGA-3 FATTY ACIDS (FISH OIL) 1000 MG CAPS    Take by mouth. Take one daily   POLYETHYL GLYCOL-PROPYL GLYCOL (SYSTANE ULTRA PF) 0.4-0.3 % SOLN    1 drop. One drop in both eyes as needed for dry eyes   SIMVASTATIN (ZOCOR) 10 MG TABLET    Take 10 mg by mouth at bedtime.   TIMOLOL MALEATE 0.5 % (DAILY) SOLN    1 drop. One drop in both eyes twice daily   VITAMIN D, CHOLECALCIFEROL, 1000 UNITS CAPS    Take by mouth. Take one twice daily  Modified Medications   No medications on file  Discontinued Medications   CETIRIZINE (KLS ALLER-TEC) 10 MG TABLET    Take 10 mg by mouth daily. As needed for allergies   LOSARTAN-HYDROCHLOROTHIAZIDE (HYZAAR) 50-12.5 MG TABLET  NAPROXEN SODIUM (RA NAPROXEN SODIUM) 220 MG TABLET    Take by mouth.   NIACIN (NIASPAN) 1000 MG CR TABLET       NON FORMULARY    Super [employer] Enzmes take one twice daily after meal for gas   OMEPRAZOLE (PRILOSEC) 20 MG CAPSULE       PROBIOTIC PRODUCT (PROBIOTIC DAILY PO)    Take by mouth. One twice daily   SERTRALINE (ZOLOFT) 50 MG TABLET         Review of Systems  Constitutional: Negative for fever, chills, diaphoresis, activity change, appetite change, fatigue and unexpected weight change.  HENT: Negative for congestion, ear discharge, ear pain, hearing loss, postnasal drip, rhinorrhea, sore throat, tinnitus, trouble swallowing and voice change.        She says her dentures do not fit well. Vocal cord surgery in 2003. Sometimes has  inspiratory wheezing noises. Allergic rhinitis.  Eyes: Negative for pain, redness, itching and visual disturbance (corrective lenses).       Glaucoma  Respiratory: Positive for wheezing. Negative for cough, choking and shortness of breath.        Obstructive sleep apnea  Cardiovascular: Negative for chest pain, palpitations and leg swelling.  Gastrointestinal: Negative for nausea, abdominal pain, diarrhea, constipation and abdominal distention.  Endocrine: Negative for cold intolerance, heat intolerance, polydipsia, polyphagia and polyuria.  Genitourinary: Negative for dysuria, urgency, frequency, hematuria, flank pain, vaginal discharge, difficulty urinating and pelvic pain.  Musculoskeletal: Negative for myalgias, back pain, arthralgias, gait problem, neck pain and neck stiffness.  Skin: Negative for color change, pallor and rash.  Allergic/Immunologic: Negative.   Neurological: Negative for dizziness, tremors, seizures, syncope, weakness, numbness and headaches.  Hematological: Negative for adenopathy. Does not bruise/bleed easily.  Psychiatric/Behavioral: Positive for dysphoric mood. Negative for suicidal ideas, hallucinations, behavioral problems, confusion, sleep disturbance and agitation. The patient is nervous/anxious. The patient is not hyperactive.     Filed Vitals:   12/07/15 1629  BP: 144/90  Pulse: 68  Temp: 98 F (36.7 C)  TempSrc: Oral  Height: 5' 4"  (1.626 m)  Weight: 129 lb (58.514 kg)  SpO2: 98%   Wt Readings from Last 3 Encounters:  12/07/15 129 lb (58.514 kg)  09/21/15 131 lb (59.421 kg)  06/25/12 137 lb (62.143 kg)    Body mass index is 22.13 kg/(m^2).  Physical Exam  Constitutional: She is oriented to person, place, and time. She appears well-developed and well-nourished. No distress.  Thin  HENT:  Right Ear: External ear normal.  Left Ear: External ear normal.  Nose: Nose normal.  Mouth/Throat: Oropharynx is clear and moist. No oropharyngeal exudate.   Eyes: Conjunctivae and EOM are normal. Pupils are equal, round, and reactive to light. No scleral icterus.  Neck: No JVD present. No tracheal deviation present. No thyromegaly present.  Cardiovascular: Normal rate, regular rhythm, normal heart sounds and intact distal pulses.  Exam reveals no gallop and no friction rub.   No murmur heard. Pulmonary/Chest: Effort normal. No respiratory distress. She has no wheezes. She has no rales. She exhibits no tenderness.  Abdominal: She exhibits no distension and no mass. There is no tenderness.  Musculoskeletal: Normal range of motion. She exhibits no edema or tenderness.  Lymphadenopathy:    She has no cervical adenopathy.  Neurological: She is alert and oriented to person, place, and time. No cranial nerve deficit. Coordination normal.  Balance okay with closed eyes and walking across the room.  Skin: No rash noted. She is not diaphoretic. No erythema.  No pallor.  Psychiatric: She has a normal mood and affect. Her behavior is normal. Judgment and thought content normal.     Labs reviewed: Lab Summary Latest Ref Rng 11/30/2015 08/09/2009  Hemoglobin 12.0 - 16.0 g/dL 13.6 13.9  Hematocrit 36 - 46 % 42 42  White count - 5.4 4.8  Platelet count 150 - 399 K/L 203 165  Sodium 137 - 147 mmol/L 141 140  Potassium 3.4 - 5.3 mmol/L 4.3 3.9  Calcium - (None) (None)  Phosphorus - (None) (None)  Creatinine 0.5 - 1.1 mg/dL 0.9 1.0  AST 13 - 35 U/L 18 26  Alk Phos 25 - 125 U/L 83 90  Bilirubin - (None) (None)  Glucose - 97 98  Cholesterol 0 - 200 mg/dL 186 217(A)  HDL cholesterol 35 - 70 mg/dL 63 98(A)  Triglycerides 40 - 160 mg/dL 145 78  LDL Direct - (None) (None)  LDL Calc - 94 103  Total protein - (None) (None)  Albumin - (None) (None)   Lab Results  Component Value Date   TSH 2.38 11/30/2015   Lab Results  Component Value Date   BUN 24* 11/30/2015   BUN 21 08/09/2009   Lab Results  Component Value Date   CREATININE 0.9 11/30/2015    CREATININE 1.0 08/09/2009   No results found for: HGBA1C     Assessment/Plan  1. Essential hypertension Borderline blood pressure that is not currently medicated. She says home pressures have been in the normal range. -CMP, future  2. Obstructive sleep apnea Continue to watch. Previous laryngeal surgery may be causing some of the airway obstruction. CPAP has not been indicated in the past.  3. Hyperlipidemia -Lipid panel, future  4. Balance problem Currently seems to be doing well with her balance  5. Anxiety Issues of medication were discussed thoroughly with the patient. Additional time was taken on 12/08/2015 to meet with her to discuss life at [facility]. At this point, she feels that she just needs some additional time to get her paintings up on the wall, subtle and at [facility], and not add any medications or other new activities. She agrees to call us if she changes her mind or if anxiety seems to get worse.

## 2015-12-13 DIAGNOSIS — H26491 Other secondary cataract, right eye: Secondary | ICD-10-CM | POA: Diagnosis not present

## 2015-12-13 DIAGNOSIS — H401113 Primary open-angle glaucoma, right eye, severe stage: Secondary | ICD-10-CM | POA: Diagnosis not present

## 2015-12-13 DIAGNOSIS — H401122 Primary open-angle glaucoma, left eye, moderate stage: Secondary | ICD-10-CM | POA: Diagnosis not present

## 2015-12-13 DIAGNOSIS — Z961 Presence of intraocular lens: Secondary | ICD-10-CM | POA: Diagnosis not present

## 2016-01-25 DIAGNOSIS — H26491 Other secondary cataract, right eye: Secondary | ICD-10-CM | POA: Diagnosis not present

## 2016-01-29 DIAGNOSIS — D220 Melanocytic nevi of lip: Secondary | ICD-10-CM | POA: Diagnosis not present

## 2016-01-29 DIAGNOSIS — L720 Epidermal cyst: Secondary | ICD-10-CM | POA: Diagnosis not present

## 2016-01-29 DIAGNOSIS — L821 Other seborrheic keratosis: Secondary | ICD-10-CM | POA: Diagnosis not present

## 2016-01-29 DIAGNOSIS — L570 Actinic keratosis: Secondary | ICD-10-CM | POA: Diagnosis not present

## 2016-01-29 DIAGNOSIS — D225 Melanocytic nevi of trunk: Secondary | ICD-10-CM | POA: Diagnosis not present

## 2016-01-29 DIAGNOSIS — L812 Freckles: Secondary | ICD-10-CM | POA: Diagnosis not present

## 2016-01-29 DIAGNOSIS — L918 Other hypertrophic disorders of the skin: Secondary | ICD-10-CM | POA: Diagnosis not present

## 2016-01-30 DIAGNOSIS — I10 Essential (primary) hypertension: Secondary | ICD-10-CM | POA: Diagnosis not present

## 2016-01-30 DIAGNOSIS — R5383 Other fatigue: Secondary | ICD-10-CM | POA: Diagnosis not present

## 2016-01-30 DIAGNOSIS — D649 Anemia, unspecified: Secondary | ICD-10-CM | POA: Diagnosis not present

## 2016-01-30 DIAGNOSIS — E785 Hyperlipidemia, unspecified: Secondary | ICD-10-CM | POA: Diagnosis not present

## 2016-01-30 LAB — HEPATIC FUNCTION PANEL
ALK PHOS: 84 U/L (ref 25–125)
ALT: 10 U/L (ref 7–35)
AST: 18 U/L (ref 13–35)
BILIRUBIN, TOTAL: 0.5 mg/dL

## 2016-01-30 LAB — CBC AND DIFFERENTIAL
HCT: 40 % (ref 36–46)
Hemoglobin: 13.3 g/dL (ref 12.0–16.0)
PLATELETS: 205 10*3/uL (ref 150–399)
WBC: 7.3 10^3/mL

## 2016-01-30 LAB — BASIC METABOLIC PANEL
BUN: 19 mg/dL (ref 4–21)
CREATININE: 0.9 mg/dL (ref 0.5–1.1)
Glucose: 89 mg/dL
POTASSIUM: 4.3 mmol/L (ref 3.4–5.3)
SODIUM: 141 mmol/L (ref 137–147)

## 2016-01-30 LAB — TSH: TSH: 1.61 u[IU]/mL (ref 0.41–5.90)

## 2016-01-30 LAB — LIPID PANEL
CHOLESTEROL: 181 mg/dL (ref 0–200)
HDL: 68 mg/dL (ref 35–70)
LDL Cholesterol: 85 mg/dL
TRIGLYCERIDES: 141 mg/dL (ref 40–160)

## 2016-02-03 HISTORY — PX: EYE SURGERY: SHX253

## 2016-02-15 ENCOUNTER — Other Ambulatory Visit: Payer: Self-pay | Admitting: Internal Medicine

## 2016-02-15 DIAGNOSIS — I10 Essential (primary) hypertension: Secondary | ICD-10-CM

## 2016-02-15 DIAGNOSIS — E785 Hyperlipidemia, unspecified: Secondary | ICD-10-CM

## 2016-02-15 MED ORDER — LOSARTAN POTASSIUM 50 MG PO TABS: ORAL_TABLET | ORAL | Status: DC

## 2016-02-15 MED ORDER — SIMVASTATIN 10 MG PO TABS: ORAL_TABLET | ORAL | Status: DC

## 2016-02-21 DIAGNOSIS — J3802 Paralysis of vocal cords and larynx, bilateral: Secondary | ICD-10-CM | POA: Diagnosis not present

## 2016-03-14 ENCOUNTER — Encounter: Payer: Self-pay | Admitting: Internal Medicine

## 2016-03-14 ENCOUNTER — Non-Acute Institutional Stay: Payer: Medicare Other | Admitting: Internal Medicine

## 2016-03-14 VITALS — BP 124/62 | HR 67 | Temp 98.0°F | Ht 64.0 in | Wt 131.0 lb

## 2016-03-14 DIAGNOSIS — F419 Anxiety disorder, unspecified: Secondary | ICD-10-CM

## 2016-03-14 DIAGNOSIS — E785 Hyperlipidemia, unspecified: Secondary | ICD-10-CM

## 2016-03-14 DIAGNOSIS — I10 Essential (primary) hypertension: Secondary | ICD-10-CM

## 2016-03-14 NOTE — Progress Notes (Signed)
Patient ID: Angela [name], female   DOB: 09-30-1927, 82 y.o.   MRN: [id number]    [facility]     [facility] Service[facility] ([facility]     Allergies  Allergen Reactions  . Brimonidine Other (See Comments)  . Dorzolamide Hcl Itching    EYE PAIN    Chief Complaint  Patient presents with  . Medical Management of Chronic Issues    3 month medication management of blood pressure, cholesterol, anxiety , review labs    HPI:  Essential hypertension  - normal  Hyperlipidemia - normal  Anxiety - improved.    Medications: Patient's Medications  New Prescriptions   No medications on file  Previous Medications   ARTIFICIAL TEAR OINTMENT (LUBRICANT EYE) OINT    Apply to eye. Apply both eyes at bedtime for dry eyes   ASPIRIN 81 MG TABLET    Take 81 mg by mouth daily.   BIMATOPROST (LUMIGAN) 0.03 % OPHTHALMIC SOLUTION    Place 1 drop into the right eye at bedtime.   BRINZOLAMIDE (AZOPT) 1 % OPHTHALMIC SUSPENSION    Place 1 drop into both eyes 3 (three) times daily.    CALCIUM CARB-CHOLECALCIFEROL (CALCIUM 600+D3) 600-800 MG-UNIT TABS    Take by mouth. Take one twice daily   COENZYME Q10 (COQ10) 200 MG CAPS    Take by mouth. Take one tablet once daily   LOSARTAN (COZAAR) 50 MG TABLET    Take one tablet daily for blood pressure   OMEGA-3 FATTY ACIDS (FISH OIL) 1000 MG CAPS    Take by mouth. Take one daily   POLYETHYL GLYCOL-PROPYL GLYCOL (SYSTANE ULTRA PF) 0.4-0.3 % SOLN    1 drop. One drop in both eyes as needed for dry eyes   SIMVASTATIN (ZOCOR) 10 MG TABLET    One at bedtime to control cholesterol   TIMOLOL MALEATE 0.5 % (DAILY) SOLN    1 drop. One drop in both eyes twice daily   VITAMIN C (ASCORBIC ACID) 500 MG TABLET    Take 500 mg by mouth daily.   VITAMIN D, CHOLECALCIFEROL, 1000 UNITS CAPS    Take by mouth. Take one twice daily  Modified Medications   No medications on file  Discontinued Medications   No medications on file     Review of Systems    Constitutional: Negative for fever, chills, diaphoresis, activity change, appetite change, fatigue and unexpected weight change.  HENT: Negative for congestion, ear discharge, ear pain, hearing loss, postnasal drip, rhinorrhea, sore throat, tinnitus, trouble swallowing and voice change.        She says her dentures do not fit well. Vocal cord surgery in 2003. Sometimes has inspiratory wheezing noises. Allergic rhinitis.  Eyes: Negative for pain, redness, itching and visual disturbance (corrective lenses).       Glaucoma  Respiratory: Positive for wheezing. Negative for cough, choking and shortness of breath.        Obstructive sleep apnea  Cardiovascular: Negative for chest pain, palpitations and leg swelling.  Gastrointestinal: Negative for nausea, abdominal pain, diarrhea, constipation and abdominal distention.  Endocrine: Negative for cold intolerance, heat intolerance, polydipsia, polyphagia and polyuria.  Genitourinary: Negative for dysuria, urgency, frequency, hematuria, flank pain, vaginal discharge, difficulty urinating and pelvic pain.  Musculoskeletal: Negative for myalgias, back pain, arthralgias, gait problem, neck pain and neck stiffness.  Skin: Negative for color change, pallor and rash.  Allergic/Immunologic: Negative.   Neurological: Negative for dizziness, tremors, seizures, syncope, weakness, numbness and headaches.  Hematological:  Negative for adenopathy. Does not bruise/bleed easily.  Psychiatric/Behavioral: Positive for dysphoric mood. Negative for suicidal ideas, hallucinations, behavioral problems, confusion, sleep disturbance and agitation. The patient is nervous/anxious. The patient is not hyperactive.     Filed Vitals:   03/14/16 1441  BP: 124/62  Pulse: 67  Temp: 98 F (36.7 C)  TempSrc: Oral  Height: 5' 4" (1.626 m)  Weight: 131 lb (59.421 kg)  SpO2: 95%   Wt Readings from Last 3 Encounters:  03/14/16 131 lb (59.421 kg)  12/07/15 129 lb (58.514 kg)   09/21/15 131 lb (59.421 kg)    Body mass index is 22.47 kg/(m^2).  Physical Exam  Constitutional: She is oriented to person, place, and time. She appears well-developed and well-nourished. No distress.  Thin  HENT:  Right Ear: External ear normal.  Left Ear: External ear normal.  Nose: Nose normal.  Mouth/Throat: Oropharynx is clear and moist. No oropharyngeal exudate.  Eyes: Conjunctivae and EOM are normal. Pupils are equal, round, and reactive to light. No scleral icterus.  Neck: No JVD present. No tracheal deviation present. No thyromegaly present.  Cardiovascular: Normal rate, regular rhythm, normal heart sounds and intact distal pulses.  Exam reveals no gallop and no friction rub.   No murmur heard. Pulmonary/Chest: Effort normal. No respiratory distress. She has no wheezes. She has no rales. She exhibits no tenderness.  Abdominal: She exhibits no distension and no mass. There is no tenderness.  Musculoskeletal: Normal range of motion. She exhibits no edema or tenderness.  Lymphadenopathy:    She has no cervical adenopathy.  Neurological: She is alert and oriented to person, place, and time. No cranial nerve deficit. Coordination normal.  Balance okay with closed eyes and walking across the room. Stutters a little.  Skin: No rash noted. She is not diaphoretic. No erythema. No pallor.  Psychiatric: She has a normal mood and affect. Her behavior is normal. Judgment and thought content normal.     Labs reviewed: Lab Summary Latest Ref Rng 01/30/2016 11/30/2015 08/09/2009  Hemoglobin 12.0 - 16.0 g/dL 13.3 13.6 13.9  Hematocrit 36 - 46 % 40 42 42  White count - 7.3 5.4 4.8  Platelet count 150 - 399 K/L 205 203 165  Sodium 137 - 147 mmol/L 141 141 140  Potassium 3.4 - 5.3 mmol/L 4.3 4.3 3.9  Calcium - (None) (None) (None)  Phosphorus - (None) (None) (None)  Creatinine 0.5 - 1.1 mg/dL 0.9 0.9 1.0  AST 13 - 35 U/L _0 Alk Phos 25 - 125 U/L 84 83 90  Bilirubin - (None)  (None) (None)  Glucose - 89 97 98  Cholesterol 0 - 200 mg/dL 181 186 217(A)  HDL cholesterol 35 - 70 mg/dL 68 63 98(A)  Triglycerides 40 - 160 mg/dL 141 145 78  LDL Direct - (None) (None) (None)  LDL Calc - 85 94 103  Total protein - (None) (None) (None)  Albumin - (None) (None) (None)   Lab Results  Component Value Date   TSH 1.61 01/30/2016   Lab Results  Component Value Date   BUN 19 01/30/2016   BUN 24* 11/30/2015   BUN 21 08/09/2009   Lab Results  Component Value Date   CREATININE 0.9 01/30/2016   CREATININE 0.9 11/30/2015   CREATININE 1.0 08/09/2009   No results found for: HGBA1C   Assessment/Plan  1. Essential hypertension controlled  2. Hyperlipidemia controlled  3. Anxiety improved

## 2016-05-17 DIAGNOSIS — H401113 Primary open-angle glaucoma, right eye, severe stage: Secondary | ICD-10-CM | POA: Diagnosis not present

## 2016-05-17 DIAGNOSIS — H401122 Primary open-angle glaucoma, left eye, moderate stage: Secondary | ICD-10-CM | POA: Diagnosis not present

## 2016-05-22 DIAGNOSIS — Z961 Presence of intraocular lens: Secondary | ICD-10-CM | POA: Diagnosis not present

## 2016-05-22 DIAGNOSIS — H401113 Primary open-angle glaucoma, right eye, severe stage: Secondary | ICD-10-CM | POA: Diagnosis not present

## 2016-05-22 DIAGNOSIS — H401122 Primary open-angle glaucoma, left eye, moderate stage: Secondary | ICD-10-CM | POA: Diagnosis not present

## 2016-06-20 ENCOUNTER — Non-Acute Institutional Stay: Payer: Medicare Other | Admitting: Internal Medicine

## 2016-06-20 ENCOUNTER — Encounter: Payer: Self-pay | Admitting: Internal Medicine

## 2016-06-20 VITALS — BP 120/72 | HR 74 | Temp 99.2°F | Ht 64.0 in | Wt 125.0 lb

## 2016-06-20 DIAGNOSIS — F419 Anxiety disorder, unspecified: Secondary | ICD-10-CM

## 2016-06-20 DIAGNOSIS — E785 Hyperlipidemia, unspecified: Secondary | ICD-10-CM

## 2016-06-20 DIAGNOSIS — R634 Abnormal weight loss: Secondary | ICD-10-CM

## 2016-06-20 DIAGNOSIS — F418 Other specified anxiety disorders: Secondary | ICD-10-CM | POA: Diagnosis not present

## 2016-06-20 DIAGNOSIS — I10 Essential (primary) hypertension: Secondary | ICD-10-CM

## 2016-06-20 HISTORY — DX: Abnormal weight loss: R63.4

## 2016-06-20 MED ORDER — MIRTAZAPINE 15 MG PO TBDP: ORAL_TABLET | ORAL | 5 refills | Status: DC

## 2016-06-20 NOTE — Progress Notes (Signed)
Facility      Place of Service: Clinic (12)     Allergies  Allergen Reactions  . Brimonidine Other (See Comments)  . Dorzolamide Hcl Itching    EYE PAIN    Chief Complaint  Patient presents with  . Medical Management of Chronic Issues    3 month medication management blood pressure, anxiety     HPI:  Essential hypertension - controlled  Anxiety - unchanged  Hyperlipidemia - follow up next visit  Loss of weight - may be related to nerves. denies nausea.  Depression with anxiety - wiling to take a mediction to see if nrves and weight will improve    Medications: Patient's Medications  New Prescriptions   No medications on file  Previous Medications   ARTIFICIAL TEAR OINTMENT (LUBRICANT EYE) OINT    Apply to eye. Apply both eyes at bedtime for dry eyes   ASPIRIN 81 MG TABLET    Take 81 mg by mouth daily.   BIMATOPROST (LUMIGAN) 0.03 % OPHTHALMIC SOLUTION    Place 1 drop into the right eye at bedtime.   BRINZOLAMIDE (AZOPT) 1 % OPHTHALMIC SUSPENSION    Place 1 drop into both eyes 3 (three) times daily.    CALCIUM CARB-CHOLECALCIFEROL (CALCIUM 600+D3) 600-800 MG-UNIT TABS    Take by mouth. Take one twice daily   COENZYME Q10 (COQ10) 200 MG CAPS    Take by mouth. Take one tablet once daily   LOSARTAN (COZAAR) 50 MG TABLET    Take one tablet daily for blood pressure   OMEGA-3 FATTY ACIDS (FISH OIL) 1000 MG CAPS    Take by mouth. Take one daily   POLYETHYL GLYCOL-PROPYL GLYCOL (SYSTANE ULTRA PF) 0.4-0.3 % SOLN    1 drop. One drop in both eyes as needed for dry eyes   SIMVASTATIN (ZOCOR) 10 MG TABLET    One at bedtime to control cholesterol   TIMOLOL MALEATE 0.5 % (DAILY) SOLN    1 drop. One drop in both eyes twice daily   VITAMIN C (ASCORBIC ACID) 500 MG TABLET    Take 500 mg by mouth daily.   VITAMIN D, CHOLECALCIFEROL, 1000 UNITS CAPS    Take by mouth. Take one twice daily  Modified Medications   No medications on file  Discontinued Medications   No medications on  file     Review of Systems  Constitutional: Positive for unexpected weight change (lost 6 # in the last 3 months). Negative for activity change, appetite change, chills, diaphoresis, fatigue and fever.  HENT: Negative for congestion, ear discharge, ear pain, hearing loss, postnasal drip, rhinorrhea, sore throat, tinnitus, trouble swallowing and voice change.        She says her dentures do not fit well. Vocal cord surgery in 2003. Sometimes has inspiratory wheezing noises. Allergic rhinitis.  Eyes: Negative for pain, redness, itching and visual disturbance (corrective lenses).       Glaucoma  Respiratory: Positive for wheezing. Negative for cough, choking and shortness of breath.        Obstructive sleep apnea  Cardiovascular: Negative for chest pain, palpitations and leg swelling.  Gastrointestinal: Negative for abdominal distention, abdominal pain, constipation, diarrhea and nausea.  Endocrine: Negative for cold intolerance, heat intolerance, polydipsia, polyphagia and polyuria.  Genitourinary: Negative for difficulty urinating, dysuria, flank pain, frequency, hematuria, pelvic pain, urgency and vaginal discharge.  Musculoskeletal: Negative for arthralgias, back pain, gait problem, myalgias, neck pain and neck stiffness.  Skin: Negative for color change, pallor and  rash.  Allergic/Immunologic: Negative.   Neurological: Negative for dizziness, tremors, seizures, syncope, weakness, numbness and headaches.  Hematological: Negative for adenopathy. Does not bruise/bleed easily.  Psychiatric/Behavioral: Positive for dysphoric mood. Negative for agitation, behavioral problems, confusion, hallucinations, sleep disturbance and suicidal ideas. The patient is nervous/anxious. The patient is not hyperactive.     Vitals:   06/20/16 1413  BP: 120/72  Pulse: 74  Temp: 99.2 F (37.3 C)  TempSrc: Oral  SpO2: 96%  Weight: 125 lb (56.7 kg)  Height: 5' 4"  (1.626 m)   Wt Readings from Last 3  Encounters:  06/20/16 125 lb (56.7 kg)  03/14/16 131 lb (59.4 kg)  12/07/15 129 lb (58.5 kg)    Body mass index is 21.46 kg/m.  Physical Exam  Constitutional: She is oriented to person, place, and time. She appears well-developed and well-nourished. No distress.  Thin  HENT:  Right Ear: External ear normal.  Left Ear: External ear normal.  Nose: Nose normal.  Mouth/Throat: Oropharynx is clear and moist. No oropharyngeal exudate.  Eyes: Conjunctivae and EOM are normal. Pupils are equal, round, and reactive to light. No scleral icterus.  Neck: No JVD present. No tracheal deviation present. No thyromegaly present.  Cardiovascular: Normal rate, regular rhythm, normal heart sounds and intact distal pulses.  Exam reveals no gallop and no friction rub.   No murmur heard. Pulmonary/Chest: Effort normal. No respiratory distress. She has no wheezes. She has no rales. She exhibits no tenderness.  Abdominal: She exhibits no distension and no mass. There is no tenderness.  Musculoskeletal: Normal range of motion. She exhibits no edema or tenderness.  Lymphadenopathy:    She has no cervical adenopathy.  Neurological: She is alert and oriented to person, place, and time. No cranial nerve deficit. Coordination normal.  Balance okay with closed eyes and walking across the room. Stutters a little.  Skin: No rash noted. She is not diaphoretic. No erythema. No pallor.  Psychiatric: She has a normal mood and affect. Her behavior is normal. Judgment and thought content normal.     Labs reviewed: Lab Summary Latest Ref Rng & Units 01/30/2016 11/30/2015 08/09/2009  Hemoglobin 12.0 - 16.0 g/dL 13.3 13.6 13.9  Hematocrit 36 - 46 % 40 42 42  White count 10:3/mL 7.3 5.4 4.8  Platelet count 150 - 399 K/L 205 203 165  Sodium 137 - 147 mmol/L 141 141 140  Potassium 3.4 - 5.3 mmol/L 4.3 4.3 3.9  Calcium - (None) (None) (None)  Phosphorus - (None) (None) (None)  Creatinine 0.5 - 1.1 mg/dL 0.9 0.9 1.0  AST  13 - 35 U/L 18 18 26   Alk Phos 25 - 125 U/L 84 83 90  Bilirubin - (None) (None) (None)  Glucose mg/dL 89 97 98  Cholesterol 0 - 200 mg/dL 181 186 217(A)  HDL cholesterol 35 - 70 mg/dL 68 63 98(A)  Triglycerides 40 - 160 mg/dL 141 145 78  LDL Direct - (None) (None) (None)  LDL Calc mg/dL 85 94 103  Total protein - (None) (None) (None)  Albumin - (None) (None) (None)  Some recent data might be hidden   Lab Results  Component Value Date   TSH 1.61 01/30/2016   Lab Results  Component Value Date   BUN 19 01/30/2016   BUN 24 (A) 11/30/2015   BUN 21 08/09/2009   Lab Results  Component Value Date   CREATININE 0.9 01/30/2016   CREATININE 0.9 11/30/2015   CREATININE 1.0 08/09/2009   No results found  for: HGBA1C     Assessment/Plan  1. Essential hypertension -CMP, future  2. Anxiety Start mirtazipine  3. Hyperlipidemia -lipids, future  4. Loss of weight -mitazapine  5. Depression with anxiety - mirtazapine (REMERON SOL-TAB) 15 MG disintegrating tablet; One nightly to help improve appetite and for depression  Dispense: 30 tablet; Refill: 5

## 2016-06-27 ENCOUNTER — Encounter: Payer: Self-pay | Admitting: Internal Medicine

## 2016-06-27 DIAGNOSIS — H401113 Primary open-angle glaucoma, right eye, severe stage: Secondary | ICD-10-CM | POA: Diagnosis not present

## 2016-07-15 DIAGNOSIS — H401122 Primary open-angle glaucoma, left eye, moderate stage: Secondary | ICD-10-CM | POA: Diagnosis not present

## 2016-07-15 DIAGNOSIS — H401113 Primary open-angle glaucoma, right eye, severe stage: Secondary | ICD-10-CM | POA: Diagnosis not present

## 2016-08-15 DIAGNOSIS — Z23 Encounter for immunization: Secondary | ICD-10-CM | POA: Diagnosis not present

## 2016-08-19 DIAGNOSIS — Z961 Presence of intraocular lens: Secondary | ICD-10-CM | POA: Diagnosis not present

## 2016-08-19 DIAGNOSIS — H401113 Primary open-angle glaucoma, right eye, severe stage: Secondary | ICD-10-CM | POA: Diagnosis not present

## 2016-08-19 DIAGNOSIS — H401122 Primary open-angle glaucoma, left eye, moderate stage: Secondary | ICD-10-CM | POA: Diagnosis not present

## 2016-08-29 ENCOUNTER — Other Ambulatory Visit: Payer: Self-pay | Admitting: *Deleted

## 2016-08-29 DIAGNOSIS — I10 Essential (primary) hypertension: Secondary | ICD-10-CM

## 2016-08-29 DIAGNOSIS — E785 Hyperlipidemia, unspecified: Secondary | ICD-10-CM

## 2016-09-02 ENCOUNTER — Other Ambulatory Visit: Payer: Self-pay

## 2016-09-02 ENCOUNTER — Other Ambulatory Visit: Payer: Self-pay | Admitting: *Deleted

## 2016-09-02 DIAGNOSIS — E785 Hyperlipidemia, unspecified: Secondary | ICD-10-CM

## 2016-09-02 DIAGNOSIS — I10 Essential (primary) hypertension: Secondary | ICD-10-CM | POA: Diagnosis not present

## 2016-09-03 LAB — BASIC METABOLIC PANEL
BUN: 19 mg/dL (ref 7–25)
CALCIUM: 9.2 mg/dL (ref 8.6–10.4)
CHLORIDE: 106 mmol/L (ref 98–110)
CO2: 29 mmol/L (ref 20–31)
CREATININE: 0.87 mg/dL (ref 0.60–0.88)
Glucose, Bld: 99 mg/dL (ref 65–99)
Potassium: 4.1 mmol/L (ref 3.5–5.3)
Sodium: 143 mmol/L (ref 135–146)

## 2016-09-03 LAB — LIPID PANEL
CHOLESTEROL: 158 mg/dL (ref 125–200)
HDL: 59 mg/dL (ref >=46)
LDL Cholesterol: 81 mg/dL (ref <130)
TRIGLYCERIDES: 89 mg/dL (ref <150)
Total CHOL/HDL Ratio: 2.7 Ratio (ref <=5.0)
VLDL: 18 mg/dL (ref <30)

## 2016-09-09 DIAGNOSIS — H401133 Primary open-angle glaucoma, bilateral, severe stage: Secondary | ICD-10-CM | POA: Diagnosis not present

## 2016-09-09 DIAGNOSIS — H524 Presbyopia: Secondary | ICD-10-CM | POA: Diagnosis not present

## 2016-09-12 ENCOUNTER — Non-Acute Institutional Stay: Payer: Medicare Other | Admitting: Internal Medicine

## 2016-09-12 ENCOUNTER — Encounter: Payer: Self-pay | Admitting: Internal Medicine

## 2016-09-12 VITALS — BP 148/80 | HR 67 | Temp 98.1°F | Ht 64.0 in | Wt 130.0 lb

## 2016-09-12 DIAGNOSIS — E785 Hyperlipidemia, unspecified: Secondary | ICD-10-CM

## 2016-09-12 DIAGNOSIS — F419 Anxiety disorder, unspecified: Secondary | ICD-10-CM

## 2016-09-12 DIAGNOSIS — J3800 Paralysis of vocal cords and larynx, unspecified: Secondary | ICD-10-CM

## 2016-09-12 DIAGNOSIS — F418 Other specified anxiety disorders: Secondary | ICD-10-CM

## 2016-09-12 DIAGNOSIS — I10 Essential (primary) hypertension: Secondary | ICD-10-CM

## 2016-09-12 HISTORY — DX: Paralysis of vocal cords and larynx, unspecified: J38.00

## 2016-09-12 NOTE — Progress Notes (Signed)
Facility  [facility]    Place of Service: Clinic (12)     Allergies  Allergen Reactions  . Brimonidine Other (See Comments)  . Dorzolamide Hcl Itching    EYE PAIN    Chief Complaint  Patient presents with  . Medical Management of Chronic Issues    6 month medication management blood pressure, anxiety, cholesterol, depression, review labs    HPI:  Essential hypertension  - mild elevation in the SBP.  Hyperlipidemia, unspecified hyperlipidemia type - controlled  Anxiety - has adapted to [facility]. Sleeping.   Depression with anxiety - improved  Wheezing chronically related to laryngeal paresis.    Medications: Patient's Medications  New Prescriptions   No medications on file  Previous Medications   ARTIFICIAL TEAR OINTMENT (LUBRICANT EYE) OINT    Apply to eye. Apply both eyes at bedtime for dry eyes   ASPIRIN 81 MG TABLET    Take 81 mg by mouth daily.   BIMATOPROST (LUMIGAN) 0.03 % OPHTHALMIC SOLUTION    Place 1 drop into the right eye at bedtime.   BRINZOLAMIDE (AZOPT) 1 % OPHTHALMIC SUSPENSION    Place 1 drop into both eyes 3 (three) times daily.    CALCIUM CARB-CHOLECALCIFEROL (CALCIUM 600+D3) 600-800 MG-UNIT TABS    Take by mouth. Take one twice daily   COENZYME Q10 (COQ10) 200 MG CAPS    Take by mouth. Take one tablet once daily   LOSARTAN (COZAAR) 50 MG TABLET    Take one tablet daily for blood pressure   MIRTAZAPINE (REMERON SOL-TAB) 15 MG DISINTEGRATING TABLET    One nightly to help improve appetite and for depression   OMEGA-3 FATTY ACIDS (FISH OIL) 1000 MG CAPS    Take by mouth. Take one daily   POLYETHYL GLYCOL-PROPYL GLYCOL (SYSTANE ULTRA PF) 0.4-0.3 % SOLN    1 drop. One drop in both eyes as needed for dry eyes   SIMVASTATIN (ZOCOR) 10 MG TABLET    One at bedtime to control cholesterol   TIMOLOL MALEATE 0.5 % (DAILY) SOLN    1 drop. One drop in both eyes twice daily   VITAMIN C (ASCORBIC ACID) 500 MG TABLET    Take 500 mg by mouth daily.   VITAMIN D,  CHOLECALCIFEROL, 1000 UNITS CAPS    Take by mouth. Take one twice daily  Modified Medications   No medications on file  Discontinued Medications   No medications on file     Review of Systems  Constitutional: Positive for unexpected weight change (lost 6 # in the last 3 months). Negative for activity change, appetite change, chills, diaphoresis, fatigue and fever.  HENT: Negative for congestion, ear discharge, ear pain, hearing loss, postnasal drip, rhinorrhea, sore throat, tinnitus, trouble swallowing and voice change.        She says her dentures do not fit well. Vocal cord surgery in 2003. Sometimes has inspiratory wheezing noises related to chronic laryngeal paresis. Allergic rhinitis.  Eyes: Negative for pain, redness, itching and visual disturbance (corrective lenses).       Glaucoma  Respiratory: Positive for wheezing. Negative for cough, choking and shortness of breath.        Obstructive sleep apnea  Cardiovascular: Negative for chest pain, palpitations and leg swelling.  Gastrointestinal: Negative for abdominal distention, abdominal pain, constipation, diarrhea and nausea.  Endocrine: Negative for cold intolerance, heat intolerance, polydipsia, polyphagia and polyuria.  Genitourinary: Negative for difficulty urinating, dysuria, flank pain, frequency, hematuria, pelvic pain, urgency and vaginal discharge.  Musculoskeletal: Negative for arthralgias, back pain, gait problem, myalgias, neck pain and neck stiffness.  Skin: Negative for color change, pallor and rash.  Allergic/Immunologic: Negative.   Neurological: Negative for dizziness, tremors, seizures, syncope, weakness, numbness and headaches.  Hematological: Negative for adenopathy. Does not bruise/bleed easily.  Psychiatric/Behavioral: Positive for dysphoric mood. Negative for agitation, behavioral problems, confusion, hallucinations, sleep disturbance and suicidal ideas. The patient is nervous/anxious. The patient is not  hyperactive.     Vitals:   09/12/16 1338  BP: (!) 148/80  Pulse: 67  Temp: 98.1 F (36.7 C)  TempSrc: Oral  SpO2: 96%  Weight: 130 lb (59 kg)  Height: 5' 4"  (1.626 m)   Wt Readings from Last 3 Encounters:  09/12/16 130 lb (59 kg)  06/20/16 125 lb (56.7 kg)  03/14/16 131 lb (59.4 kg)    Body mass index is 22.31 kg/m.  Physical Exam  Constitutional: She is oriented to person, place, and time. She appears well-developed and well-nourished. No distress.  Thin  HENT:  Right Ear: External ear normal.  Left Ear: External ear normal.  Nose: Nose normal.  Mouth/Throat: Oropharynx is clear and moist. No oropharyngeal exudate.  Vocal cord paresis  Eyes: Conjunctivae and EOM are normal. Pupils are equal, round, and reactive to light. No scleral icterus.  Neck: No JVD present. No tracheal deviation present. No thyromegaly present.  Cardiovascular: Normal rate, regular rhythm, normal heart sounds and intact distal pulses.  Exam reveals no gallop and no friction rub.   No murmur heard. Pulmonary/Chest: Effort normal. No respiratory distress. She has wheezes. She has no rales. She exhibits no tenderness.  Abdominal: She exhibits no distension and no mass. There is no tenderness.  Musculoskeletal: Normal range of motion. She exhibits no edema or tenderness.  Lymphadenopathy:    She has no cervical adenopathy.  Neurological: She is alert and oriented to person, place, and time. No cranial nerve deficit. Coordination normal.  Balance okay with closed eyes and walking across the room. Stutters a little.  Skin: No rash noted. She is not diaphoretic. No erythema. No pallor.  Psychiatric: She has a normal mood and affect. Her behavior is normal. Judgment and thought content normal.     Labs reviewed: Lab Summary Latest Ref Rng & Units 09/02/2016 01/30/2016 11/30/2015  Hemoglobin 12.0 - 16.0 g/dL (None) 13.3 13.6  Hematocrit 36 - 46 % (None) 40 42  White count 10:3/mL (None) 7.3 5.4    Platelet count 150 - 399 K/L (None) 205 203  Sodium 135 - 146 mmol/L 143 141 141  Potassium 3.5 - 5.3 mmol/L 4.1 4.3 4.3  Calcium 8.6 - 10.4 mg/dL 9.2 (None) (None)  Phosphorus - (None) (None) (None)  Creatinine 0.60 - 0.88 mg/dL 0.87 0.9 0.9  AST 13 - 35 U/L (None) 18 18  Alk Phos 25 - 125 U/L (None) 84 83  Bilirubin - (None) (None) (None)  Glucose 65 - 99 mg/dL 99 89 97  Cholesterol 125 - 200 mg/dL 158 181 186  HDL cholesterol >=46 mg/dL 59 68 63  Triglycerides <150 mg/dL 89 141 145  LDL Direct - (None) (None) (None)  LDL Calc <130 mg/dL 81 85 94  Total protein - (None) (None) (None)  Albumin - (None) (None) (None)  Some recent data might be hidden   Lab Results  Component Value Date   TSH 1.61 01/30/2016   Lab Results  Component Value Date   BUN 19 09/02/2016   BUN 19 01/30/2016   BUN 24 (  A) 11/30/2015   Lab Results  Component Value Date   CREATININE 0.87 09/02/2016   CREATININE 0.9 01/30/2016   CREATININE 0.9 11/30/2015   No results found for: HGBA1C     Assessment/Plan  1. Essential hypertension Controlled -CMP, future  2. Hyperlipidemia, unspecified hyperlipidemia type Controlled -Lipids, future  3. Anxiety immproved  4. Depression with anxiety improved

## 2016-12-10 ENCOUNTER — Encounter: Payer: Self-pay | Admitting: Internal Medicine

## 2016-12-23 DIAGNOSIS — H401113 Primary open-angle glaucoma, right eye, severe stage: Secondary | ICD-10-CM | POA: Diagnosis not present

## 2016-12-23 DIAGNOSIS — H401122 Primary open-angle glaucoma, left eye, moderate stage: Secondary | ICD-10-CM | POA: Diagnosis not present

## 2016-12-28 ENCOUNTER — Other Ambulatory Visit: Payer: Self-pay | Admitting: Internal Medicine

## 2016-12-28 DIAGNOSIS — F418 Other specified anxiety disorders: Secondary | ICD-10-CM

## 2017-02-13 DIAGNOSIS — L821 Other seborrheic keratosis: Secondary | ICD-10-CM | POA: Diagnosis not present

## 2017-02-13 DIAGNOSIS — L720 Epidermal cyst: Secondary | ICD-10-CM | POA: Diagnosis not present

## 2017-02-13 DIAGNOSIS — D225 Melanocytic nevi of trunk: Secondary | ICD-10-CM | POA: Diagnosis not present

## 2017-02-13 DIAGNOSIS — D2239 Melanocytic nevi of other parts of face: Secondary | ICD-10-CM | POA: Diagnosis not present

## 2017-02-13 DIAGNOSIS — L570 Actinic keratosis: Secondary | ICD-10-CM | POA: Diagnosis not present

## 2017-02-13 DIAGNOSIS — L812 Freckles: Secondary | ICD-10-CM | POA: Diagnosis not present

## 2017-02-21 ENCOUNTER — Other Ambulatory Visit: Payer: Self-pay | Admitting: Internal Medicine

## 2017-02-21 DIAGNOSIS — E785 Hyperlipidemia, unspecified: Secondary | ICD-10-CM

## 2017-02-21 DIAGNOSIS — I10 Essential (primary) hypertension: Secondary | ICD-10-CM

## 2017-03-04 ENCOUNTER — Other Ambulatory Visit: Payer: Medicare Other

## 2017-03-13 ENCOUNTER — Encounter: Payer: Self-pay | Admitting: Internal Medicine

## 2017-03-17 ENCOUNTER — Other Ambulatory Visit: Payer: Self-pay | Admitting: Internal Medicine

## 2017-03-17 DIAGNOSIS — E785 Hyperlipidemia, unspecified: Secondary | ICD-10-CM

## 2017-03-17 DIAGNOSIS — I10 Essential (primary) hypertension: Secondary | ICD-10-CM

## 2017-04-01 DIAGNOSIS — R490 Dysphonia: Secondary | ICD-10-CM | POA: Diagnosis not present

## 2017-04-01 DIAGNOSIS — J3802 Paralysis of vocal cords and larynx, bilateral: Secondary | ICD-10-CM | POA: Diagnosis not present

## 2017-04-01 DIAGNOSIS — J384 Edema of larynx: Secondary | ICD-10-CM | POA: Diagnosis not present

## 2017-04-01 DIAGNOSIS — J383 Other diseases of vocal cords: Secondary | ICD-10-CM | POA: Diagnosis not present

## 2017-04-02 DIAGNOSIS — E785 Hyperlipidemia, unspecified: Secondary | ICD-10-CM | POA: Diagnosis not present

## 2017-04-02 DIAGNOSIS — I10 Essential (primary) hypertension: Secondary | ICD-10-CM | POA: Diagnosis not present

## 2017-04-03 ENCOUNTER — Other Ambulatory Visit: Payer: Medicare Other

## 2017-04-03 LAB — COMPREHENSIVE METABOLIC PANEL
ALT: 10 U/L (ref 6–29)
AST: 20 U/L (ref 10–35)
Albumin: 4.2 g/dL (ref 3.6–5.1)
Alkaline Phosphatase: 96 U/L (ref 33–130)
BILIRUBIN TOTAL: 0.5 mg/dL (ref 0.2–1.2)
BUN: 22 mg/dL (ref 7–25)
CALCIUM: 9.5 mg/dL (ref 8.6–10.4)
CHLORIDE: 105 mmol/L (ref 98–110)
CO2: 32 mmol/L — AB (ref 20–31)
CREATININE: 0.89 mg/dL — AB (ref 0.60–0.88)
GLUCOSE: 90 mg/dL (ref 65–99)
Potassium: 4.2 mmol/L (ref 3.5–5.3)
SODIUM: 141 mmol/L (ref 135–146)
Total Protein: 6.7 g/dL (ref 6.1–8.1)

## 2017-04-03 LAB — LIPID PANEL
Cholesterol: 185 mg/dL (ref <200)
HDL: 57 mg/dL (ref >50)
LDL CALC: 95 mg/dL (ref <100)
Total CHOL/HDL Ratio: 3.2 Ratio (ref <5.0)
Triglycerides: 163 mg/dL — ABNORMAL HIGH (ref <150)
VLDL: 33 mg/dL — AB (ref <30)

## 2017-04-10 ENCOUNTER — Encounter: Payer: Self-pay | Admitting: Internal Medicine

## 2017-04-10 ENCOUNTER — Non-Acute Institutional Stay: Payer: Medicare Other | Admitting: Internal Medicine

## 2017-04-10 VITALS — BP 122/76 | HR 64 | Temp 97.9°F | Ht 64.0 in | Wt 135.0 lb

## 2017-04-10 DIAGNOSIS — I10 Essential (primary) hypertension: Secondary | ICD-10-CM | POA: Diagnosis not present

## 2017-04-10 DIAGNOSIS — R634 Abnormal weight loss: Secondary | ICD-10-CM

## 2017-04-10 DIAGNOSIS — F418 Other specified anxiety disorders: Secondary | ICD-10-CM | POA: Diagnosis not present

## 2017-04-10 DIAGNOSIS — E785 Hyperlipidemia, unspecified: Secondary | ICD-10-CM | POA: Diagnosis not present

## 2017-04-10 NOTE — Progress Notes (Signed)
Facility  [facility]    Place of Service: Clinic (12)     Allergies  Allergen Reactions  . Brimonidine Other (See Comments)  . Dorzolamide Hcl Itching    EYE PAIN    Chief Complaint  Patient presents with  . Medical Management of Chronic Issues    6 month medication management blood pressure, anxiety, depression, cholesterol, review labs    HPI:   Essential hypertension - controlled  Hyperlipidemia, unspecified hyperlipidemia type - controlled  Depression with anxiety - controlled  Loss of weight - resolved    Medications: Patient's Medications  New Prescriptions   No medications on file  Previous Medications   ASPIRIN 81 MG TABLET    Take 81 mg by mouth daily.   BIMATOPROST (LUMIGAN) 0.03 % OPHTHALMIC SOLUTION    Place 1 drop into the right eye at bedtime.   BRINZOLAMIDE (AZOPT) 1 % OPHTHALMIC SUSPENSION    Place 1 drop into both eyes 3 (three) times daily.    CALCIUM CARB-CHOLECALCIFEROL (CALCIUM 600+D3) 600-800 MG-UNIT TABS    Take by mouth. Take one twice daily   COENZYME Q10 (COQ10) 200 MG CAPS    Take by mouth. Take one tablet once daily   LOSARTAN (COZAAR) 50 MG TABLET    TAKE ONE TABLET DAILY FOR BLOOD PRESSURE   MIRTAZAPINE (REMERON SOL-TAB) 15 MG DISINTEGRATING TABLET    TAKE 1 TABLET BY MOUTH NIGHTLY TO HELP IMPROVE APPETITE AND FOR DEPRESSION   OMEGA-3 FATTY ACIDS (FISH OIL) 1000 MG CAPS    Take by mouth. Take one daily   POLYETHYL GLYCOL-PROPYL GLYCOL (SYSTANE ULTRA PF) 0.4-0.3 % SOLN    1 drop. One drop in both eyes as needed for dry eyes   SIMVASTATIN (ZOCOR) 10 MG TABLET    ONE AT BEDTIME TO CONTROL CHOLESTEROL   TIMOLOL MALEATE 0.5 % (DAILY) SOLN    1 drop. One drop in both eyes twice daily   VITAMIN C (ASCORBIC ACID) 500 MG TABLET    Take 500 mg by mouth daily.   VITAMIN D, CHOLECALCIFEROL, 1000 UNITS CAPS    Take by mouth. Take one twice daily  Modified Medications   No medications on file  Discontinued Medications   ARTIFICIAL TEAR OINTMENT  (LUBRICANT EYE) OINT    Apply to eye. Apply both eyes at bedtime for dry eyes     Review of Systems  Constitutional: Negative for activity change, appetite change, chills, diaphoresis, fatigue, fever and unexpected weight change.  HENT: Negative for congestion, ear discharge, ear pain, hearing loss, postnasal drip, rhinorrhea, sore throat, tinnitus, trouble swallowing and voice change.        She says her dentures do not fit well. Vocal cord surgery in 2003. Sometimes has inspiratory wheezing noises related to chronic laryngeal paresis. Allergic rhinitis.  Eyes: Negative for pain, redness, itching and visual disturbance (corrective lenses).       Glaucoma  Respiratory: Positive for wheezing. Negative for cough, choking and shortness of breath.        Obstructive sleep apnea  Cardiovascular: Negative for chest pain, palpitations and leg swelling.  Gastrointestinal: Negative for abdominal distention, abdominal pain, constipation, diarrhea and nausea.  Endocrine: Negative for cold intolerance, heat intolerance, polydipsia, polyphagia and polyuria.  Genitourinary: Negative for difficulty urinating, dysuria, flank pain, frequency, hematuria, pelvic pain, urgency and vaginal discharge.  Musculoskeletal: Negative for arthralgias, back pain, gait problem, myalgias, neck pain and neck stiffness.  Skin: Negative for color change, pallor and rash.  Allergic/Immunologic: Negative.  Neurological: Negative for dizziness, tremors, seizures, syncope, weakness, numbness and headaches.  Hematological: Negative for adenopathy. Does not bruise/bleed easily.  Psychiatric/Behavioral: Positive for dysphoric mood. Negative for agitation, behavioral problems, confusion (.cont), hallucinations, sleep disturbance and suicidal ideas. The patient is nervous/anxious. The patient is not hyperactive.     Vitals:   04/10/17 1442  BP: 122/76  Pulse: 64  Temp: 97.9 F (36.6 C)  TempSrc: Oral  SpO2: 97%  Weight: 135  lb (61.2 kg)  Height: _0  (1.626 m)   Wt Readings from Last 3 Encounters:  04/10/17 135 lb (61.2 kg)  09/12/16 130 lb (59 kg)  06/20/16 125 lb (56.7 kg)    Body mass index is 23.17 kg/m.  Physical Exam  Constitutional: She is oriented to person, place, and time. She appears well-developed and well-nourished. No distress.  Thin  HENT:  Right Ear: External ear normal.  Left Ear: External ear normal.  Nose: Nose normal.  Mouth/Throat: Oropharynx is clear and moist. No oropharyngeal exudate.  Vocal cord paresis  Eyes: Conjunctivae and EOM are normal. Pupils are equal, round, and reactive to light. No scleral icterus.  Neck: No JVD present. No tracheal deviation present. No thyromegaly present.  Cardiovascular: Normal rate, regular rhythm, normal heart sounds and intact distal pulses.  Exam reveals no gallop and no friction rub.   No murmur heard. Pulmonary/Chest: Effort normal. No respiratory distress. She has wheezes. She has no rales. She exhibits no tenderness.  Abdominal: She exhibits no distension and no mass. There is no tenderness.  Musculoskeletal: Normal range of motion. She exhibits no edema or tenderness.  Lymphadenopathy:    She has no cervical adenopathy.  Neurological: She is alert and oriented to person, place, and time. No cranial nerve deficit. Coordination normal.  Balance okay with closed eyes and walking across the room. Stutters a little.  Skin: No rash noted. She is not diaphoretic. No erythema. No pallor.  Psychiatric: She has a normal mood and affect. Her behavior is normal. Judgment and thought content normal.     Labs reviewed: Lab Summary Latest Ref Rng & Units 04/02/2017 09/02/2016 01/30/2016  Hemoglobin 12.0 - 16.0 g/dL (None) (None) 13.3  Hematocrit 36 - 46 % (None) (None) 40  White count 10:3/mL (None) (None) 7.3  Platelet count 150 - 399 K/L (None) (None) 205  Sodium 135 - 146 mmol/L 141 143 141  Potassium 3.5 - 5.3 mmol/L 4.2 4.1 4.3    Calcium 8.6 - 10.4 mg/dL 9.5 9.2 (None)  Phosphorus - (None) (None) (None)  Creatinine 0.60 - 0.88 mg/dL 0.89(H) 0.87 0.9  AST 10 - 35 U/L 20 (None) 18  Alk Phos 33 - 130 U/L 96 (None) 84  Bilirubin 0.2 - 1.2 mg/dL 0.5 (None) (None)  Glucose 65 - 99 mg/dL 90 99 89  Cholesterol <200 mg/dL 185 158 181  HDL cholesterol >50 mg/dL 57 59 68  Triglycerides <150 mg/dL 163(H) 89 141  LDL Direct - (None) (None) (None)  LDL Calc <100 mg/dL 95 81 85  Total protein 6.1 - 8.1 g/dL 6.7 (None) (None)  Albumin 3.6 - 5.1 g/dL 4.2 (None) (None)  Some recent data might be hidden   Lab Results  Component Value Date   TSH 1.61 01/30/2016   Lab Results  Component Value Date   BUN 22 04/02/2017   BUN 19 09/02/2016   BUN 19 01/30/2016   Lab Results  Component Value Date   CREATININE 0.89 (H) 04/02/2017   CREATININE 0.87 09/02/2016  CREATININE 0.9 01/30/2016   No results found for: HGBA1C     Assessment/Plan  1. Essential hypertension The current medical regimen is effective;  continue present plan and medications.  2. Hyperlipidemia, unspecified hyperlipidemia type The current medical regimen is effective;  continue present plan and medications.  3. Depression with anxiety The current medical regimen is effective;  continue present plan and medications.  4. Loss of weight resolved

## 2017-04-30 DIAGNOSIS — H401113 Primary open-angle glaucoma, right eye, severe stage: Secondary | ICD-10-CM | POA: Diagnosis not present

## 2017-04-30 DIAGNOSIS — H401122 Primary open-angle glaucoma, left eye, moderate stage: Secondary | ICD-10-CM | POA: Diagnosis not present

## 2017-04-30 DIAGNOSIS — Z961 Presence of intraocular lens: Secondary | ICD-10-CM | POA: Diagnosis not present

## 2017-05-08 DIAGNOSIS — H401122 Primary open-angle glaucoma, left eye, moderate stage: Secondary | ICD-10-CM | POA: Diagnosis not present

## 2017-07-14 DIAGNOSIS — Z961 Presence of intraocular lens: Secondary | ICD-10-CM | POA: Diagnosis not present

## 2017-07-14 DIAGNOSIS — H401122 Primary open-angle glaucoma, left eye, moderate stage: Secondary | ICD-10-CM | POA: Diagnosis not present

## 2017-07-14 DIAGNOSIS — H401113 Primary open-angle glaucoma, right eye, severe stage: Secondary | ICD-10-CM | POA: Diagnosis not present

## 2017-08-09 ENCOUNTER — Other Ambulatory Visit: Payer: Self-pay | Admitting: Internal Medicine

## 2017-08-09 DIAGNOSIS — F418 Other specified anxiety disorders: Secondary | ICD-10-CM

## 2017-08-13 DIAGNOSIS — Z23 Encounter for immunization: Secondary | ICD-10-CM | POA: Diagnosis not present

## 2017-09-29 ENCOUNTER — Telehealth: Payer: Self-pay | Admitting: Internal Medicine

## 2017-09-29 NOTE — Telephone Encounter (Signed)
Left msg asking pt to schedule AWV-I at [facility] clinic on Friday 10/03/17 if available. VDM (DD)

## 2017-10-03 ENCOUNTER — Non-Acute Institutional Stay: Payer: Medicare Other

## 2017-10-03 VITALS — BP 142/66 | HR 65 | Temp 98.1°F | Resp 95 | Ht 64.0 in | Wt 139.0 lb

## 2017-10-03 DIAGNOSIS — Z0000 Encounter for general adult medical examination without abnormal findings: Secondary | ICD-10-CM

## 2017-10-03 NOTE — Patient Instructions (Signed)
Angela [name] , Thank you for taking time to come for your Medicare Wellness Visit. I appreciate your ongoing commitment to your health goals. Please review the following plan we discussed and let me know if I can assist you in the future.   Screening recommendations/referrals: Colonoscopy excluded, you are over age 81 Mammogram excluded, you are over age 24 Bone Density up to date Recommended yearly ophthalmology/optometry visit for glaucoma screening and checkup Recommended yearly dental visit for hygiene and checkup  Vaccinations: Influenza vaccine up to date. Due 2019 fall season Pneumococcal vaccine up to date Tdap vaccine due, please bring past immunizations with you when you come next time Shingles vaccine due, let us know if you decide to get this vaccine    Advanced directives: In Chart  Conditions/risks identified: None  Next appointment: Dr. Bubba Camp 10/14/2017 @ 8:30am   Preventive Care 65 Years and Older, Female Preventive care refers to lifestyle choices and visits with your health care provider that can promote health and wellness. What does preventive care include?  A yearly physical exam. This is also called an annual well check.  Dental exams once or twice a year.  Routine eye exams. Ask your health care provider how often you should have your eyes checked.  Personal lifestyle choices, including:  Daily care of your teeth and gums.  Regular physical activity.  Eating a healthy diet.  Avoiding tobacco and drug use.  Limiting alcohol use.  Practicing safe sex.  Taking low-dose aspirin every day.  Taking vitamin and mineral supplements as recommended by your health care provider. What happens during an annual well check? The services and screenings done by your health care provider during your annual well check will depend on your age, overall health, lifestyle risk factors, and family history of disease. Counseling  Your health care provider may ask you  questions about your:  Alcohol use.  Tobacco use.  Drug use.  Emotional well-being.  Home and relationship well-being.  Sexual activity.  Eating habits.  History of falls.  Memory and ability to understand (cognition).  Work and work Statistician.  Reproductive health. Screening  You may have the following tests or measurements:  Height, weight, and BMI.  Blood pressure.  Lipid and cholesterol levels. These may be checked every 5 years, or more frequently if you are over 62 years old.  Skin check.  Lung cancer screening. You may have this screening every year starting at age 36 if you have a 30-pack-year history of smoking and currently smoke or have quit within the past 15 years.  Fecal occult blood test (FOBT) of the stool. You may have this test every year starting at age 13.  Flexible sigmoidoscopy or colonoscopy. You may have a sigmoidoscopy every 5 years or a colonoscopy every 10 years starting at age 53.  Hepatitis C blood test.  Hepatitis B blood test.  Sexually transmitted disease (STD) testing.  Diabetes screening. This is done by checking your blood sugar (glucose) after you have not eaten for a while (fasting). You may have this done every 1-3 years.  Bone density scan. This is done to screen for osteoporosis. You may have this done starting at age 6.  Mammogram. This may be done every 1-2 years. Talk to your health care provider about how often you should have regular mammograms. Talk with your health care provider about your test results, treatment options, and if necessary, the need for more tests. Vaccines  Your health care provider may recommend certain  vaccines, such as:  Influenza vaccine. This is recommended every year.  Tetanus, diphtheria, and acellular pertussis (Tdap, Td) vaccine. You may need a Td booster every 10 years.  Zoster vaccine. You may need this after age 3.  Pneumococcal 13-valent conjugate (PCV13) vaccine. One dose is  recommended after age 55.  Pneumococcal polysaccharide (PPSV23) vaccine. One dose is recommended after age 23. Talk to your health care provider about which screenings and vaccines you need and how often you need them. This information is not intended to replace advice given to you by your health care provider. Make sure you discuss any questions you have with your health care provider. Document Released: 11/17/2015 Document Revised: 07/10/2016 Document Reviewed: 08/22/2015 Elsevier Interactive Patient Education  2017 [place] Prevention in the Home Falls can cause injuries. They can happen to people of all ages. There are many things you can do to make your home safe and to help prevent falls. What can I do on the outside of my home?  Regularly fix the edges of walkways and driveways and fix any cracks.  Remove anything that might make you trip as you walk through a door, such as a raised step or threshold.  Trim any bushes or trees on the path to your home.  Use bright outdoor lighting.  Clear any walking paths of anything that might make someone trip, such as rocks or tools.  Regularly check to see if handrails are loose or broken. Make sure that both sides of any steps have handrails.  Any raised decks and porches should have guardrails on the edges.  Have any leaves, snow, or ice cleared regularly.  Use sand or salt on walking paths during winter.  Clean up any spills in your garage right away. This includes oil or grease spills. What can I do in the bathroom?  Use night lights.  Install grab bars by the toilet and in the tub and shower. Do not use towel bars as grab bars.  Use non-skid mats or decals in the tub or shower.  If you need to sit down in the shower, use a plastic, non-slip stool.  Keep the floor dry. Clean up any water that spills on the floor as soon as it happens.  Remove soap buildup in the tub or shower regularly.  Attach bath mats  securely with double-sided non-slip rug tape.  Do not have throw rugs and other things on the floor that can make you trip. What can I do in the bedroom?  Use night lights.  Make sure that you have a light by your bed that is easy to reach.  Do not use any sheets or blankets that are too big for your bed. They should not hang down onto the floor.  Have a firm chair that has side arms. You can use this for support while you get dressed.  Do not have throw rugs and other things on the floor that can make you trip. What can I do in the kitchen?  Clean up any spills right away.  Avoid walking on wet floors.  Keep items that you use a lot in easy-to-reach places.  If you need to reach something above you, use a strong step stool that has a grab bar.  Keep electrical cords out of the way.  Do not use floor polish or wax that makes floors slippery. If you must use wax, use non-skid floor wax.  Do not have throw rugs and other things  on the floor that can make you trip. What can I do with my stairs?  Do not leave any items on the stairs.  Make sure that there are handrails on both sides of the stairs and use them. Fix handrails that are broken or loose. Make sure that handrails are as long as the stairways.  Check any carpeting to make sure that it is firmly attached to the stairs. Fix any carpet that is loose or worn.  Avoid having throw rugs at the top or bottom of the stairs. If you do have throw rugs, attach them to the floor with carpet tape.  Make sure that you have a light switch at the top of the stairs and the bottom of the stairs. If you do not have them, ask someone to add them for you. What else can I do to help prevent falls?  Wear shoes that:  Do not have high heels.  Have rubber bottoms.  Are comfortable and fit you well.  Are closed at the toe. Do not wear sandals.  If you use a stepladder:  Make sure that it is fully opened. Do not climb a closed  stepladder.  Make sure that both sides of the stepladder are locked into place.  Ask someone to hold it for you, if possible.  Clearly mark and make sure that you can see:  Any grab bars or handrails.  First and last steps.  Where the edge of each step is.  Use tools that help you move around (mobility aids) if they are needed. These include:  Canes.  Walkers.  Scooters.  Crutches.  Turn on the lights when you go into a dark area. Replace any light bulbs as soon as they burn out.  Set up your furniture so you have a clear path. Avoid moving your furniture around.  If any of your floors are uneven, fix them.  If there are any pets around you, be aware of where they are.  Review your medicines with your doctor. Some medicines can make you feel dizzy. This can increase your chance of falling. Ask your doctor what other things that you can do to help prevent falls. This information is not intended to replace advice given to you by your health care provider. Make sure you discuss any questions you have with your health care provider. Document Released: 08/17/2009 Document Revised: 03/28/2016 Document Reviewed: 11/25/2014 Elsevier Interactive Patient Education  2017 [employer].

## 2017-10-03 NOTE — Progress Notes (Signed)
Subjective:   Angela [name] is a 83 y.o. female who presents for an Initial Medicare Annual Wellness Visit at [facility]        Objective:    Today's Vitals   10/03/17 1049  BP: (!) 142/66  Pulse: 65  Resp: (!) 95  Temp: 98.1 F (36.7 C)  TempSrc: Oral  Weight: 139 lb (63 kg)  Height: 5\' 4"  (1.626 m)   Body mass index is 23.86 kg/m.  Advanced Directives 10/03/2017 04/10/2017 09/12/2016 03/14/2016 12/07/2015 09/21/2015  Does Patient Have a Medical Advance Directive? Yes Yes Yes Yes Yes Yes  Type of Paramedic of [place];Living will;Out of facility DNR (pink MOST or yellow form) [place];Living will [place];Living will [place];Living will [place];Living will [place];Living will  Does patient want to make changes to medical advance directive? No - Patient declined - - - - -  Copy of [place] in Chart? Yes Yes Yes Yes Yes No - copy requested  Pre-existing out of facility DNR order (yellow form or pink MOST form) Yellow form placed in chart (order not valid for inpatient use);Pink MOST form placed in chart (order not valid for inpatient use) - - - - -    Current Medications (verified) Outpatient Encounter Medications as of 10/03/2017  Medication Sig  . aspirin 81 MG tablet Take 81 mg by mouth daily.  . bimatoprost (LUMIGAN) 0.03 % ophthalmic solution Place 1 drop into the right eye at bedtime.  . brinzolamide (AZOPT) 1 % ophthalmic suspension Place 1 drop into both eyes 3 (three) times daily.   . Calcium Carb-Cholecalciferol (CALCIUM 600+D3) 600-800 MG-UNIT TABS Take by mouth. Take one twice daily  . Coenzyme Q10 (COQ10) 200 MG CAPS Take by mouth. Take one tablet once daily  . fluticasone (FLONASE) 50 MCG/ACT nasal spray Place into both nostrils daily.  Marland Kitchen losartan (COZAAR) 50 MG tablet TAKE ONE TABLET  DAILY FOR BLOOD PRESSURE  . mirtazapine (REMERON SOL-TAB) 15 MG disintegrating tablet TAKE 1 TABLET BY MOUTH NIGHTLY TO HELP IMPROVE APPETITE AND FOR DEPRESSION  . Omega-3 Fatty Acids (FISH OIL) 1000 MG CAPS Take by mouth. Take one daily  . Polyethyl Glycol-Propyl Glycol (SYSTANE ULTRA PF) 0.4-0.3 % SOLN 1 drop. One drop in both eyes as needed for dry eyes  . simvastatin (ZOCOR) 10 MG tablet ONE AT BEDTIME TO CONTROL CHOLESTEROL  . Timolol Maleate 0.5 % (DAILY) SOLN 1 drop. One drop in both eyes twice daily  . vitamin C (ASCORBIC ACID) 500 MG tablet Take 500 mg by mouth daily.  . Vitamin D, Cholecalciferol, 1000 UNITS CAPS Take by mouth. Take one twice daily   No facility-administered encounter medications on file as of 10/03/2017.     Allergies (verified) Brimonidine and Dorzolamide hcl   History: Past Medical History:  Diagnosis Date  . Anemia   . Anxiety   . Apnea 05/2000   sleep study revealed mild upper airway obstruction & apnea during speel, no set recommendations for therapy  . Asthma   . Diverticulosis   . Dyspnea 12/15/09   spirometer normal & function actually worsens with nebk may have more of a "exercise-induced" type of bronchospasm   . Fatigue 07/2015  . GERD (gastroesophageal reflux disease)   . Glaucoma    both eyes  . Hyperlipidemia   . Hypertension   . Laryngeal paresis 09/12/2016  . Loss  of weight 06/20/2016  . OSA (obstructive sleep apnea)   . Osteoporosis, senile   . Vocal cord dysfunction    per ENT, pt NOT to have elective intubation without disscussing with him   Past Surgical History:  Procedure Laterality Date  . CATARACT EXTRACTION W/ INTRAOCULAR LENS  IMPLANT, BILATERAL  2011   Dr. Charise Killian  . CERVICAL CONIZATION W/BX     due to cervical dysplasia  . COLONOSCOPY  02/2002   Diverticublosis   . EYE SURGERY Right 02/2016   laser surgery for scar tissure  . nasolaryngoscopy  2001   reflux laryngitis  . SKIN CANCER EXCISION     ?BCC  . TONSILLECTOMY     . UPPER GI ENDOSCOPY  05/2000   upper airway edema/changes that were consistent with GERD  . vocal fold cordotomy Left 2003   Dr. Denyse Dago [facility]   Family History  Problem Relation Age of Onset  . Melanoma Mother   . Transient ischemic attack Father   . Multiple sclerosis Sister   . Cancer Brother   . Colon cancer Neg Hx    Social History   Socioeconomic History  . Marital status: Widowed    Spouse name: None  . Number of children: 3  . Years of education: None  . Highest education level: None  Social Needs  . Financial resource strain: Somewhat hard  . Food insecurity - worry: Never true  . Food insecurity - inability: Never true  . Transportation needs - medical: No  . Transportation needs - non-medical: No  Occupational History  . Occupation: Retired  Tobacco Use  . Smoking status: Never Smoker  . Smokeless tobacco: Never Used  Substance and Sexual Activity  . Alcohol use: No  . Drug use: No  . Sexual activity: No  Other Topics Concern  . None  Social History Narrative   Lives at [facility]   Widowed   Previously smoked stopped 1986   Alcohol none   Caffeine occasionally   Exercise walk    Tobacco Counseling Counseling given: Not Answered   Clinical Intake:  Pre-visit preparation completed: No  Pain : No/denies pain     Nutritional Risks: None Diabetes: No  Activities of Daily Living: Independent Ambulation: Independent with device- listed below Home Assistive Devices/Equipment: Eyeglasses, Dentures (specify type)(Full dentures) Medication Administration: Independent Home Management: Independent  Barriers to Care Management & Learning: None  Do you feel unsafe in your current relationship?: No Do you feel physically threatened by others?: No Anyone hurting you at home, work, or school?: No Unable to ask?: No Information provided on Community resources: No  How often do you need to have someone help you when you read  instructions, pamphlets, or other written materials from your doctor or pharmacy?: 2 - Rarely What is the last grade level you completed in school?: 11th grade  Interpreter Needed?: No  Information entered by :: Rich Reining, RN   Activities of Daily Living In your present state of health, do you have any difficulty performing the following activities: 10/03/2017  Hearing? N  Vision? N  Difficulty concentrating or making decisions? Y  Walking or climbing stairs? N  Dressing or bathing? N  Doing errands, shopping? N  Preparing Food and eating ? N  Using the Toilet? N  In the past six months, have you accidently leaked urine? N  Do you have problems with loss of bowel control? N  Managing your Medications? N  Managing your Finances? N  Housekeeping or managing your Housekeeping? N  Some recent data might be hidden    Timed Get Up and Go Performed: 6 seconds to walk 10 ft-within normal limits  Immunizations and Health Maintenance Immunization History  Administered Date(s) Administered  . DTaP 08/08/2015  . Influenza, High Dose Seasonal PF 08/13/2017  . Influenza-Unspecified 08/02/2015, 08/15/2016  . Pneumococcal Conjugate-13 08/02/2015  . Pneumococcal Polysaccharide-23 09/05/1999   Health Maintenance Due  Topic Date Due  . Samul Dada  08/02/1946    Patient Care Team: Blanchie Serve, MD as PCP - General (Internal Medicine) Ander Slade, Carlisle Beers, MD as Referring Physician (Ophthalmology) Jarome Matin, MD as Consulting Physician (Dermatology) Ernestine Conrad, MD as Consulting Physician (Otolaryngology) Ngetich, Nelda Bucks, NP as Nurse Practitioner (Family Medicine)  Indicate any recent Medical Services you may have received from other than [facility] providers in the past year (date may be approximate).     Assessment:   This is a routine wellness examination for Angela [name].   Hearing/Vision screen Hearing Screening Comments: Pt reports no issues with hearing Vision Screening  Comments: Sees Dr. Augustin Coupe every 3 months   Dietary issues and exercise activities discussed: Current Exercise Habits: Home exercise routine, Type of exercise: walking, Exercise limited by: None identified  Goals    None     Depression Screen PHQ 2/9 Scores 10/03/2017 03/14/2016  PHQ - 2 Score 1 0    Fall Risk Fall Risk  10/03/2017 03/14/2016  Falls in the past year? No No    Is the patient's home free of loose throw rugs in walkways, pet beds, electrical cords, etc?   yes      Grab bars in the bathroom? yes      Handrails on the stairs?   yes      Adequate lighting?   yes  Cognitive Function: MMSE - Mini Mental State Exam 10/03/2017  Orientation to time 5  Orientation to Place 5  Registration 3  Attention/ Calculation 5  Recall 3  Language- name 2 objects 2  Language- repeat 1  Language- follow 3 step command 3  Language- read & follow direction 1  Write a sentence 1  Copy design 1  Total score 30        Screening Tests Health Maintenance  Topic Date Due  . TETANUS/TDAP  08/02/1946  . INFLUENZA VACCINE  Completed  . DEXA SCAN  Completed  . PNA vac Low Risk Adult  Completed    Cancer Screenings: Lung:  Low Dose CT Chest recommended if Age 39-80 years, 30 pack-year currently smoking OR have quit w/in 15years. Patient does not qualify. Breast:  Up to date on Mammogram? Yes  Up to date of Bone Density/Dexa? Yes Colorectal: up to date, pt is over age 45  Additional Screenings: Hepatitis B/HIV/Syphillis:Declined Hepatitis C Screening: Declined     Plan:    I have personally reviewed and addressed the Medicare Annual Wellness questionnaire and have noted the following in the patient's chart:  A. Medical and social history B. Use of alcohol, tobacco or illicit drugs  C. Current medications and supplements D. Functional ability and status E.  Nutritional status F.  Physical activity G. Advance directives H. List of other physicians I.  Hospitalizations,  surgeries, and ER visits in previous 12 months J.  [place] to include hearing, vision, cognitive, depression L. Referrals and appointments - none  In addition, I have reviewed and discussed with patient certain preventive protocols, quality metrics, and best practice recommendations. A written  personalized care plan for preventive services as well as general preventive health recommendations were provided to patient.  See attached scanned questionnaire for additional information.   Signed,   Rich Reining, RN Nurse Health Advisor   Quick Notes   Health Maintenance: Patient declined shingles vaccine. Patient is due for tdap but is going to check past immunization records     Abnormal Screen: MMSE 30/30. Passed clock drawing     Patient Concerns: None     Nurse Concerns: Patient's anxiety

## 2017-10-14 ENCOUNTER — Encounter: Payer: Self-pay | Admitting: Internal Medicine

## 2017-10-21 ENCOUNTER — Encounter: Payer: Self-pay | Admitting: Internal Medicine

## 2017-10-21 ENCOUNTER — Other Ambulatory Visit: Payer: Self-pay | Admitting: Internal Medicine

## 2017-10-21 ENCOUNTER — Other Ambulatory Visit: Payer: Self-pay

## 2017-10-21 ENCOUNTER — Non-Acute Institutional Stay: Payer: Medicare Other | Admitting: Internal Medicine

## 2017-10-21 VITALS — BP 136/70 | HR 63 | Temp 98.3°F | Resp 16 | Ht 64.0 in | Wt 139.6 lb

## 2017-10-21 DIAGNOSIS — M810 Age-related osteoporosis without current pathological fracture: Secondary | ICD-10-CM | POA: Diagnosis not present

## 2017-10-21 DIAGNOSIS — H401190 Primary open-angle glaucoma, unspecified eye, stage unspecified: Secondary | ICD-10-CM

## 2017-10-21 DIAGNOSIS — H6123 Impacted cerumen, bilateral: Secondary | ICD-10-CM

## 2017-10-21 DIAGNOSIS — J3802 Paralysis of vocal cords and larynx, bilateral: Secondary | ICD-10-CM | POA: Diagnosis not present

## 2017-10-21 DIAGNOSIS — F418 Other specified anxiety disorders: Secondary | ICD-10-CM | POA: Diagnosis not present

## 2017-10-21 DIAGNOSIS — E785 Hyperlipidemia, unspecified: Secondary | ICD-10-CM | POA: Diagnosis not present

## 2017-10-21 DIAGNOSIS — J3089 Other allergic rhinitis: Secondary | ICD-10-CM

## 2017-10-21 DIAGNOSIS — E2839 Other primary ovarian failure: Secondary | ICD-10-CM

## 2017-10-21 DIAGNOSIS — I10 Essential (primary) hypertension: Secondary | ICD-10-CM

## 2017-10-21 NOTE — Patient Instructions (Addendum)
  I would recommend you to see a psychologist. Please talk with Angela [name], social worker  You need your ears cleaned. Talk to Angela [name], the nurse for independent living to help put your name in list of patients for cleaning by [facility].  I would also recommend you to take your flonase once a day to help with your nasal congestion/ stuffiness.

## 2017-10-21 NOTE — Progress Notes (Signed)
[facility]  Provider: Blanchie Serve MD   Location:  [place] of Service:  Clinic (12)  PCP: Blanchie Serve, MD Patient Care Team: Blanchie Serve, MD as PCP - General (Internal Medicine) Ander Slade, Carlisle Beers, MD as Referring Physician (Ophthalmology) Jarome Matin, MD as Consulting Physician (Dermatology) Ernestine Conrad, MD as Consulting Physician (Otolaryngology) Ngetich, Nelda Bucks, NP as Nurse Practitioner (Family Medicine)  Extended Emergency Contact Information Primary Emergency Contact: Jacelyn Grip Address: [address]          [place] [address] Johnnette Litter of [place] Phone: [phone] Mobile Phone: [phone] Relation: Daughter  Code Status: DNR  Goals of Care: Advanced Directive information Advanced Directives 10/03/2017  Does Patient Have a Medical Advance Directive? Yes  Type of Paramedic of [place];Living will;Out of facility DNR (pink MOST or yellow form)  Does patient want to make changes to medical advance directive? No - Patient declined  Copy of [place] in Chart? Yes  Pre-existing out of facility DNR order (yellow form or pink MOST form) Yellow form placed in chart (order not valid for inpatient use);Pink MOST form placed in chart (order not valid for inpatient use)      Chief Complaint  Patient presents with  . Medical Management of Chronic Issues    6 month follow up. No concerns at this time.   . Medication Refill    No refills needed at this time.   Marland Kitchen FYI    Patient brought a form from a doctor from [place] about her swallowing    HPI: Patient is a 83 y.o. female seen today for routine visit. She is here with her daughter [name]. She denies any concerns.  History of Vocal fold paralysis- s/p left posterior vocal fold cordotomy to allow sufficient glottal airway for her daily respiratory needs. Has note from her ENT at [facility].    Hypertension- currently on losartan 50 mg daily with baby aspirin  Depression- currently on remeron 15 mg daily, mood stable, helping her cope with current situation. Per daughter, pt has been more anxious lately. She worries about being a trouble for her daughter and is concerned what will happen if she needs higher level of care.   Hyperlipidemia- takes simvastatin 10 mg daily with coenzyme and fish oil  Allergic rhinitis- currently on flonase daily but not using it. Has nasal stuffiness and rhinorrhea  Glaucoma- takes brinzolamide, bimatoprost and systane eye drops with timolol  Osteoporosis- on daily calcium and vitamin d supplement at present   Past Medical History:  Diagnosis Date  . Anemia   . Anxiety   . Apnea 05/2000   sleep study revealed mild upper airway obstruction & apnea during speel, no set recommendations for therapy  . Asthma   . Diverticulosis   . Dyspnea 12/15/09   spirometer normal & function actually worsens with nebk may have more of a "exercise-induced" type of bronchospasm   . Fatigue 07/2015  . GERD (gastroesophageal reflux disease)   . Glaucoma    both eyes  . Hyperlipidemia   . Hypertension   . Laryngeal paresis 09/12/2016  . Loss of weight 06/20/2016  . OSA (obstructive sleep apnea)   . Osteoporosis, senile   . Vocal cord dysfunction    per ENT, pt NOT to have elective intubation without disscussing with him   Past Surgical History:  Procedure Laterality Date  . CATARACT EXTRACTION W/ INTRAOCULAR LENS  IMPLANT, BILATERAL  2011   Dr. Charise Killian  . CERVICAL CONIZATION W/BX     due to cervical dysplasia  . COLONOSCOPY  02/2002   Diverticublosis   . EYE SURGERY Right 02/2016   laser surgery for scar tissure  . nasolaryngoscopy  2001   reflux laryngitis  . SKIN CANCER EXCISION     ?BCC  . TONSILLECTOMY    . UPPER GI ENDOSCOPY  05/2000   upper airway edema/changes that were consistent with GERD  . vocal fold cordotomy Left 2003   Dr. Denyse Dago  [facility]    reports that  has never smoked. she has never used smokeless tobacco. She reports that she does not drink alcohol or use drugs. Social History   Socioeconomic History  . Marital status: Widowed    Spouse name: Not on file  . Number of children: 3  . Years of education: Not on file  . Highest education level: Not on file  Social Needs  . Financial resource strain: Somewhat hard  . Food insecurity - worry: Never true  . Food insecurity - inability: Never true  . Transportation needs - medical: No  . Transportation needs - non-medical: No  Occupational History  . Occupation: Retired  Tobacco Use  . Smoking status: Never Smoker  . Smokeless tobacco: Never Used  Substance and Sexual Activity  . Alcohol use: No  . Drug use: No  . Sexual activity: No  Other Topics Concern  . Not on file  Social History Narrative   Lives at [facility]   Widowed   Previously smoked stopped 1986   Alcohol none   Caffeine occasionally   Exercise walk    Functional Status Survey:    Family History  Problem Relation Age of Onset  . Melanoma Mother   . Transient ischemic attack Father   . Multiple sclerosis Sister   . Cancer Brother   . Colon cancer Neg Hx     Health Maintenance  Topic Date Due  . TETANUS/TDAP  08/07/2025  . INFLUENZA VACCINE  Completed  . DEXA SCAN  Completed  . PNA vac Low Risk Adult  Completed    Allergies  Allergen Reactions  . Brimonidine Other (See Comments)  . Dorzolamide Hcl Itching    EYE PAIN    Outpatient Encounter Medications as of 10/21/2017  Medication Sig  . aspirin 81 MG tablet Take 81 mg by mouth daily.  . bimatoprost (LUMIGAN) 0.03 % ophthalmic solution Place 1 drop into the right eye at bedtime.  . brinzolamide (AZOPT) 1 % ophthalmic suspension Place 1 drop into both eyes 3 (three) times daily.   . Calcium Carb-Cholecalciferol (CALCIUM 600+D3) 600-800 MG-UNIT TABS Take 1 tablet by mouth 2 (two) times daily.   . Coenzyme  Q10 (COQ10) 200 MG CAPS Take 1 capsule by mouth daily.   . fluticasone (FLONASE) 50 MCG/ACT nasal spray Place into both nostrils daily.  Marland Kitchen losartan (COZAAR) 50 MG tablet TAKE ONE TABLET DAILY FOR BLOOD PRESSURE  . mirtazapine (REMERON SOL-TAB) 15 MG disintegrating tablet TAKE 1 TABLET BY MOUTH NIGHTLY TO HELP IMPROVE APPETITE AND FOR DEPRESSION  . Omega-3 Fatty Acids (FISH OIL) 1000 MG CAPS Take 1 capsule by mouth daily.   Vladimir Faster Glycol-Propyl Glycol (SYSTANE ULTRA PF) 0.4-0.3 % SOLN Place 1 drop into both eyes as needed (dry eyes).   . simvastatin (ZOCOR) 10 MG tablet ONE AT BEDTIME TO CONTROL CHOLESTEROL  . Timolol Maleate 0.5 % (DAILY) SOLN Place 1 drop into both eyes  2 (two) times daily.   . vitamin C (ASCORBIC ACID) 500 MG tablet Take 500 mg by mouth daily.  . Vitamin D, Cholecalciferol, 1000 UNITS CAPS Take 1 capsule by mouth 2 (two) times daily.    No facility-administered encounter medications on file as of 10/21/2017.     Review of Systems  Constitutional: Negative for appetite change, chills, fatigue and fever.  HENT: Positive for rhinorrhea and voice change. Negative for congestion, ear discharge, ear pain, mouth sores, sinus pressure, sinus pain and sore throat.        Clear nasal discharge. Has history of vocal cord paralysis. Has upper and lower dentures. Has trouble chewing with her dentures. Denies any trouble swallowing.   Eyes: Positive for visual disturbance. Negative for pain and itching.       Uses corrective glasses, has glaucoma and uses eye drops  Respiratory: Negative for cough, choking and shortness of breath.   Cardiovascular: Negative for chest pain, palpitations and leg swelling.  Gastrointestinal: Negative for abdominal pain, blood in stool, constipation, diarrhea, nausea and vomiting.  Genitourinary: Negative for dysuria, flank pain, frequency and hematuria.  Musculoskeletal: Negative for arthralgias, back pain and gait problem.       No fall reported in  last 1 year, right knee arthritis  Skin: Negative for rash and wound.  Neurological: Negative for dizziness, tremors, seizures, syncope, numbness and headaches.  Hematological: Bruises/bleeds easily.  Psychiatric/Behavioral: Positive for dysphoric mood. Negative for behavioral problems, confusion, hallucinations, self-injury, sleep disturbance and suicidal ideas.    Vitals:   10/21/17 0822  BP: 136/70  Pulse: 63  Resp: 16  Temp: 98.3 F (36.8 C)  TempSrc: Oral  SpO2: 95%  Weight: 139 lb 9.6 oz (63.3 kg)  Height: 5' 4"  (1.626 m)   Body mass index is 23.96 kg/m.   Wt Readings from Last 3 Encounters:  10/21/17 139 lb 9.6 oz (63.3 kg)  10/03/17 139 lb (63 kg)  04/10/17 135 lb (61.2 kg)   Physical Exam  Constitutional: She is oriented to person, place, and time. She appears well-developed and well-nourished. No distress.  HENT:  Head: Normocephalic and atraumatic.  Nose: Nose normal.  Mouth/Throat: Oropharynx is clear and moist. No oropharyngeal exudate.  Impacted cerumen to both ears. Hard of hearing  Eyes: Conjunctivae and EOM are normal. Pupils are equal, round, and reactive to light. Right eye exhibits no discharge. Left eye exhibits no discharge. No scleral icterus.  Neck: Normal range of motion. Neck supple.  Cardiovascular: Normal rate and regular rhythm.  Pulmonary/Chest: Effort normal. No respiratory distress. She has wheezes. She has no rales. She exhibits no tenderness.  Abdominal: Soft. Bowel sounds are normal. There is no tenderness.  Musculoskeletal: She exhibits no edema.  Able to move all 4 extremities  Lymphadenopathy:    She has no cervical adenopathy.  Neurological: She is alert and oriented to person, place, and time.  Skin: Skin is warm and dry. No rash noted. She is not diaphoretic. No erythema.  Psychiatric:  anxious    Labs reviewed: Basic Metabolic Panel: Recent Labs    04/02/17 0801  NA 141  K 4.2  CL 105  CO2 32*  GLUCOSE 90  BUN 22    CREATININE 0.89*  CALCIUM 9.5   Liver Function Tests: Recent Labs    04/02/17 0801  AST 20  ALT 10  ALKPHOS 96  BILITOT 0.5  PROT 6.7  ALBUMIN 4.2   No results for input(s): LIPASE, AMYLASE in the last 8760  hours. No results for input(s): AMMONIA in the last 8760 hours. CBC: No results for input(s): WBC, NEUTROABS, HGB, HCT, MCV, PLT in the last 8760 hours. Cardiac Enzymes: No results for input(s): CKTOTAL, CKMB, CKMBINDEX, TROPONINI in the last 8760 hours. BNP: Invalid input(s): POCBNP No results found for: HGBA1C Lab Results  Component Value Date   TSH 1.61 01/30/2016   No results found for: VITAMINB12 No results found for: FOLATE No results found for: IRON, TIBC, FERRITIN  Lipid Panel: Recent Labs    04/02/17 0801  CHOL 185  HDL 57  LDLCALC 95  TRIG 163*  CHOLHDL 3.2   No results found for: HGBA1C  Procedures since last visit: No results found.  Assessment/Plan  1. Bilateral impacted cerumen Will have her see [facility] for ear lavage.   2. Essential hypertension Continue losartan for now, continue aspirin - CMP with eGFR; Future - Lipid Panel; Future - CBC (no diff); Future  3. Allergic rhinitis due to other allergic trigger, unspecified seasonality Advised to use her flonase once a day, pt agrees  4. Osteoporosis, unspecified osteoporosis type, unspecified pathological fracture presence Continue calcium and vit d, pt was on bisphosphonate in past but due to side effect from it to one of her friend, she self discontinued the med and does not want it for now. Fall precautions - DG Bone Density; Future - CBC (no diff); Future  5. Depression with anxiety Some anxiety noted this visit. PHQ-2 score is 0. Continue remeron for now. Advised on psychology counselling.   6. Hyperlipidemia, unspecified hyperlipidemia type Continue simvastatin with fish oil and coenzyme. Check lipid panel - Lipid Panel; Future - CBC (no diff); Future  7. Vocal fold  paralysis, bilateral S/p left cordotomy. Stable. Follows with ENT. Chronic wheezing with laryngeal paralysis  8. Primary open angle glaucoma, unspecified glaucoma stage, unspecified laterality Continue eye drops, no changes made    Labs/tests ordered:   Orders Placed This Encounter  Procedures  . CMP with eGFR    Standing Status:   Future    Standing Expiration Date:   11/11/2017  . Lipid Panel    Standing Status:   Future    Standing Expiration Date:   11/11/2017  . CBC (no diff)    Standing Status:   Future    Standing Expiration Date:   11/11/2017   Next appointment: 6 months with physical and EKG  Communication: reviewed care plan with patient and her daughter    Blanchie Serve, MD Internal Medicine [address], [place] [address] Cell Phone (Monday-Friday 8 am - 5 pm): [phone] On Call: [phone] and follow prompts after 5 pm and on weekends Office Phone: [phone] Office Fax: [phone]

## 2017-11-06 DIAGNOSIS — M810 Age-related osteoporosis without current pathological fracture: Secondary | ICD-10-CM | POA: Diagnosis not present

## 2017-11-06 DIAGNOSIS — E785 Hyperlipidemia, unspecified: Secondary | ICD-10-CM | POA: Diagnosis not present

## 2017-11-06 DIAGNOSIS — I10 Essential (primary) hypertension: Secondary | ICD-10-CM | POA: Diagnosis not present

## 2017-11-06 LAB — COMPLETE METABOLIC PANEL WITH GFR
AG Ratio: 2 (calc) (ref 1.0–2.5)
ALKALINE PHOSPHATASE (APISO): 83 U/L (ref 33–130)
ALT: 13 U/L (ref 6–29)
AST: 22 U/L (ref 10–35)
Albumin: 4.4 g/dL (ref 3.6–5.1)
BUN: 24 mg/dL (ref 7–25)
CALCIUM: 9.7 mg/dL (ref 8.6–10.4)
CO2: 30 mmol/L (ref 20–32)
CREATININE: 0.86 mg/dL (ref 0.60–0.88)
Chloride: 103 mmol/L (ref 98–110)
GFR, Est African American: 70 mL/min/{1.73_m2} (ref >=60)
GFR, Est Non African American: 60 mL/min/{1.73_m2} (ref >=60)
GLUCOSE: 93 mg/dL (ref 65–99)
Globulin: 2.2 g/dL (calc) (ref 1.9–3.7)
Potassium: 4.2 mmol/L (ref 3.5–5.3)
Sodium: 141 mmol/L (ref 135–146)
Total Bilirubin: 0.6 mg/dL (ref 0.2–1.2)
Total Protein: 6.6 g/dL (ref 6.1–8.1)

## 2017-11-06 LAB — CBC
HCT: 38.4 % (ref 35.0–45.0)
HEMOGLOBIN: 13.1 g/dL (ref 11.7–15.5)
MCH: 30.5 pg (ref 27.0–33.0)
MCHC: 34.1 g/dL (ref 32.0–36.0)
MCV: 89.5 fL (ref 80.0–100.0)
MPV: 10.8 fL (ref 7.5–12.5)
Platelets: 199 10*3/uL (ref 140–400)
RBC: 4.29 10*6/uL (ref 3.80–5.10)
RDW: 13.4 % (ref 11.0–15.0)
WBC: 5.5 10*3/uL (ref 3.8–10.8)

## 2017-11-06 LAB — LIPID PANEL
CHOL/HDL RATIO: 3.4 (calc) (ref <5.0)
Cholesterol: 203 mg/dL — ABNORMAL HIGH (ref <200)
HDL: 60 mg/dL (ref >50)
LDL CHOLESTEROL (CALC): 119 mg/dL — AB
Non-HDL Cholesterol (Calc): 143 mg/dL (calc) — ABNORMAL HIGH (ref <130)
Triglycerides: 127 mg/dL (ref <150)

## 2017-11-13 DIAGNOSIS — Z961 Presence of intraocular lens: Secondary | ICD-10-CM | POA: Diagnosis not present

## 2017-11-13 DIAGNOSIS — H401113 Primary open-angle glaucoma, right eye, severe stage: Secondary | ICD-10-CM | POA: Diagnosis not present

## 2017-11-13 DIAGNOSIS — H401122 Primary open-angle glaucoma, left eye, moderate stage: Secondary | ICD-10-CM | POA: Diagnosis not present

## 2017-11-14 ENCOUNTER — Other Ambulatory Visit: Payer: Self-pay | Admitting: *Deleted

## 2017-11-14 DIAGNOSIS — E7800 Pure hypercholesterolemia, unspecified: Secondary | ICD-10-CM

## 2017-11-17 ENCOUNTER — Inpatient Hospital Stay: Admission: RE | Admit: 2017-11-17 | Payer: Medicare Other | Source: Ambulatory Visit

## 2017-12-09 ENCOUNTER — Ambulatory Visit
Admission: RE | Admit: 2017-12-09 | Discharge: 2017-12-09 | Disposition: A | Discharge status: Home / Self Care | Payer: Medicare Other | Source: Ambulatory Visit | Attending: Internal Medicine | Admitting: Internal Medicine

## 2017-12-09 DIAGNOSIS — Z780 Asymptomatic menopausal state: Secondary | ICD-10-CM | POA: Diagnosis not present

## 2017-12-09 DIAGNOSIS — M810 Age-related osteoporosis without current pathological fracture: Secondary | ICD-10-CM

## 2017-12-10 ENCOUNTER — Other Ambulatory Visit: Payer: Self-pay | Admitting: *Deleted

## 2017-12-10 DIAGNOSIS — M810 Age-related osteoporosis without current pathological fracture: Secondary | ICD-10-CM

## 2017-12-10 MED ORDER — ALENDRONATE SODIUM 70 MG PO TABS: 70.0000 mg | ORAL_TABLET | ORAL | 11 refills | Status: DC

## 2018-01-09 DIAGNOSIS — H401113 Primary open-angle glaucoma, right eye, severe stage: Secondary | ICD-10-CM | POA: Diagnosis not present

## 2018-01-09 DIAGNOSIS — Z961 Presence of intraocular lens: Secondary | ICD-10-CM | POA: Diagnosis not present

## 2018-01-09 DIAGNOSIS — H401122 Primary open-angle glaucoma, left eye, moderate stage: Secondary | ICD-10-CM | POA: Diagnosis not present

## 2018-01-16 DIAGNOSIS — L821 Other seborrheic keratosis: Secondary | ICD-10-CM | POA: Diagnosis not present

## 2018-01-16 DIAGNOSIS — I788 Other diseases of capillaries: Secondary | ICD-10-CM | POA: Diagnosis not present

## 2018-01-16 DIAGNOSIS — D692 Other nonthrombocytopenic purpura: Secondary | ICD-10-CM | POA: Diagnosis not present

## 2018-01-23 DIAGNOSIS — H401134 Primary open-angle glaucoma, bilateral, indeterminate stage: Secondary | ICD-10-CM | POA: Diagnosis not present

## 2018-02-13 DIAGNOSIS — D1801 Hemangioma of skin and subcutaneous tissue: Secondary | ICD-10-CM | POA: Diagnosis not present

## 2018-02-13 DIAGNOSIS — L918 Other hypertrophic disorders of the skin: Secondary | ICD-10-CM | POA: Diagnosis not present

## 2018-02-13 DIAGNOSIS — L821 Other seborrheic keratosis: Secondary | ICD-10-CM | POA: Diagnosis not present

## 2018-02-13 DIAGNOSIS — L723 Sebaceous cyst: Secondary | ICD-10-CM | POA: Diagnosis not present

## 2018-02-13 DIAGNOSIS — D3612 Benign neoplasm of peripheral nerves and autonomic nervous system, upper limb, including shoulder: Secondary | ICD-10-CM | POA: Diagnosis not present

## 2018-02-24 ENCOUNTER — Other Ambulatory Visit: Payer: Self-pay | Admitting: *Deleted

## 2018-02-24 DIAGNOSIS — F418 Other specified anxiety disorders: Secondary | ICD-10-CM

## 2018-02-24 MED ORDER — MIRTAZAPINE 15 MG PO TBDP: ORAL_TABLET | ORAL | 3 refills | Status: DC

## 2018-02-24 NOTE — Telephone Encounter (Signed)
[facility] [employer] 

## 2018-04-16 DIAGNOSIS — E7800 Pure hypercholesterolemia, unspecified: Secondary | ICD-10-CM

## 2018-04-17 ENCOUNTER — Telehealth: Payer: Self-pay | Admitting: *Deleted

## 2018-04-17 NOTE — Telephone Encounter (Signed)
Spoke with patient regarding her missed lab appointment, she stated that she forgot and will come get it on Tuesday(04/21/2018)

## 2018-04-20 ENCOUNTER — Other Ambulatory Visit: Payer: Self-pay | Admitting: Internal Medicine

## 2018-04-20 ENCOUNTER — Other Ambulatory Visit: Payer: Self-pay | Admitting: *Deleted

## 2018-04-20 DIAGNOSIS — M810 Age-related osteoporosis without current pathological fracture: Secondary | ICD-10-CM | POA: Diagnosis not present

## 2018-04-20 DIAGNOSIS — F329 Major depressive disorder, single episode, unspecified: Secondary | ICD-10-CM | POA: Diagnosis not present

## 2018-04-20 DIAGNOSIS — E785 Hyperlipidemia, unspecified: Secondary | ICD-10-CM

## 2018-04-20 DIAGNOSIS — I10 Essential (primary) hypertension: Secondary | ICD-10-CM | POA: Diagnosis not present

## 2018-04-21 ENCOUNTER — Encounter: Payer: Self-pay | Admitting: Internal Medicine

## 2018-04-21 ENCOUNTER — Non-Acute Institutional Stay: Payer: Medicare Other | Admitting: Internal Medicine

## 2018-04-21 VITALS — BP 150/70 | HR 70 | Temp 97.9°F | Resp 20 | Ht 64.0 in | Wt 135.8 lb

## 2018-04-21 DIAGNOSIS — F329 Major depressive disorder, single episode, unspecified: Secondary | ICD-10-CM | POA: Diagnosis not present

## 2018-04-21 DIAGNOSIS — H04123 Dry eye syndrome of bilateral lacrimal glands: Secondary | ICD-10-CM

## 2018-04-21 DIAGNOSIS — H401130 Primary open-angle glaucoma, bilateral, stage unspecified: Secondary | ICD-10-CM

## 2018-04-21 DIAGNOSIS — F32A Depression, unspecified: Secondary | ICD-10-CM | POA: Insufficient documentation

## 2018-04-21 DIAGNOSIS — Z23 Encounter for immunization: Secondary | ICD-10-CM

## 2018-04-21 DIAGNOSIS — I10 Essential (primary) hypertension: Secondary | ICD-10-CM

## 2018-04-21 DIAGNOSIS — M810 Age-related osteoporosis without current pathological fracture: Secondary | ICD-10-CM

## 2018-04-21 DIAGNOSIS — Z8709 Personal history of other diseases of the respiratory system: Secondary | ICD-10-CM | POA: Diagnosis not present

## 2018-04-21 DIAGNOSIS — Z0000 Encounter for general adult medical examination without abnormal findings: Secondary | ICD-10-CM

## 2018-04-21 DIAGNOSIS — J3089 Other allergic rhinitis: Secondary | ICD-10-CM

## 2018-04-21 HISTORY — DX: Primary open-angle glaucoma, bilateral, stage unspecified: H40.1130

## 2018-04-21 HISTORY — DX: Dry eye syndrome of bilateral lacrimal glands: H04.123

## 2018-04-21 NOTE — Patient Instructions (Signed)
Preventing Osteoporosis, Adult Osteoporosis is a condition that causes the bones to get weaker. With osteoporosis, the bones become thinner, and the normal spaces in bone tissue become larger. This can make the bones weak and cause them to break more easily. People who have osteoporosis are more likely to break their wrist, spine, or hip. Even a minor accident or injury can be enough to break weak bones. Osteoporosis can occur with aging. Your body constantly replaces old bone tissue with new tissue. As you get older, you may lose bone tissue more quickly, or it may be replaced more slowly. Osteoporosis is more likely to develop if you have poor nutrition or do not get enough calcium or vitamin D. Other lifestyle factors can also play a role. By making some diet and lifestyle changes, you can help to keep your bones healthy and help to prevent osteoporosis. What nutrition changes can be made? Nutrition plays an important role in maintaining healthy, strong bones.  Make sure you get enough calcium every day from food or from calcium supplements. ? If you are age 50 or younger, aim to get 1,000 mg of calcium every day. ? If you are older than age 50, aim to get 1,200 mg of calcium every day.  Try to get enough vitamin D every day. ? If you are age 70 or younger, aim to get 600 international units (IU) every day. ? If you are older than age 70, aim to get 800 international units every day.  Follow a healthy diet. Eat plenty of foods that contain calcium and vitamin D. ? Calcium is in milk, cheese, yogurt, and other dairy products. Some fish and vegetables are also good sources of calcium. Many foods such as cereals and breads have had calcium added to them (are fortified). Check nutrition labels to see how much calcium is in a food or drink. ? Foods that contain vitamin D include milk, cereals, salmon, and tuna. Your body also makes vitamin D when you are out in the sun. Bare skin exposure to the sun on  your face, arms, legs, or back for no more than 30 minutes a day, 2 times per week is more than enough. Beyond that, it is important to use sunblock to protect your skin from sunburn, which increases your risk for skin cancer.  What lifestyle changes can be made? Making changes in your everyday life can also play an important role in preventing osteoporosis.  Stay active and get exercise every day. Ask your health care provider what types of exercise are best for you.  Do not use any products that contain nicotine or tobacco, such as cigarettes and e-cigarettes. If you need help quitting, ask your health care provider.  Limit alcohol intake to no more than 1 drink a day for nonpregnant women and 2 drinks a day for men. One drink equals 12 oz of beer, 5 oz of wine, or 1 oz of hard liquor.  Why are these changes important? Making these nutrition and lifestyle changes can:  Help you develop and maintain healthy, strong bones.  Prevent loss of bone mass and the problems that are caused by that loss, such as broken bones and delayed healing.  Make you feel better mentally and physically.  What can happen if changes are not made? Problems that can result from osteoporosis can be very serious. These may include:  A higher risk of broken bones that are painful and do not heal well.  Physical malformations, such as   a collapsed spine or a hunched back.  Problems with movement.  Where to find support: If you need help making changes to prevent osteoporosis, talk with your health care provider. You can ask for a referral to a diet and nutrition specialist (dietitian) and a physical therapist. Where to find more information: Learn more about osteoporosis from:  NIH Osteoporosis and Related Bone Diseases National Resource Center: www.niams.nih.gov/health_info/bone/osteoporosis/osteoporosis_ff.asp  U.S. Office on Women's Health:  www.womenshealth.gov/publications/our-publications/fact-sheet/osteoporosis.html  National Osteoporosis Foundation: www.nof.org/patients/what-is-osteoporosis/  Summary  Osteoporosis is a condition that causes weak bones that are more likely to break.  Eating a healthy diet and making sure you get enough calcium and vitamin D can help prevent osteoporosis.  Other ways to reduce your risk of osteoporosis include getting regular exercise and avoiding alcohol and products that contain nicotine or tobacco. This information is not intended to replace advice given to you by your health care provider. Make sure you discuss any questions you have with your health care provider. Document Released: 11/05/2015 Document Revised: 07/01/2016 Document Reviewed: 07/01/2016 Elsevier Interactive Patient Education  2018 Elsevier Inc.  

## 2018-04-21 NOTE — Progress Notes (Signed)
Location:      Place of Service:    Provider:  Blanchie Serve MD  Blanchie Serve, MD  Patient Care Team: Blanchie Serve, MD as PCP - General (Internal Medicine) Ander Slade Carlisle Beers, MD as Referring Physician (Ophthalmology) Jarome Matin, MD as Consulting Physician (Dermatology) Ernestine Conrad, MD as Consulting Physician (Otolaryngology) Ngetich, Nelda Bucks, NP as Nurse Practitioner (Family Medicine)  Extended Emergency Contact Information Primary Emergency Contact: Lagrea,Sandra Address: [address]          [place] [address] Johnnette Litter of [place] Phone: [phone] Mobile Phone: [phone] Relation: Daughter  Code Status:  DNR  Goals of care: Advanced Directive information Advanced Directives 10/03/2017  Does Patient Have a Medical Advance Directive? Yes  Type of Paramedic of [place];Living will;Out of facility DNR (pink MOST or yellow form)  Does patient want to make changes to medical advance directive? No - Patient declined  Copy of [place] in Chart? Yes  Pre-existing out of facility DNR order (yellow form or pink MOST form) Yellow form placed in chart (order not valid for inpatient use);Pink MOST form placed in chart (order not valid for inpatient use)     Chief Complaint  Patient presents with  . Annual Exam    annual physical    HPI:  Pt is a 84 y.o. female seen today for medical management of chronic diseases.    History of vocal cord paralysis- She has history of bilateral vocal cord paralysis s/p left posterior vocal fold cordotomy to allow sufficient glottal airway for her daily respiratory needs. She now has modest accumulation of scar tissue to posterior larynx and glottal aperture is further narrowed.   Hypertension- taking losartan 50 mg daily. Denies chest pain or dyspnea. Stopped aspirin for unclear reason. Agrees to resume it.   Depression- mood stable. Taking mirtazapine 15 mg  daily  Osteoporosis- dexa 12/09/17 with T score of -4.3. on calcium vit d supplement, was taking fosamax several years back but stopped it after reading about side effect of osteonecrosis of jaw. Does not want to try any medication for treatment of osteoporosis.   Hyperlipidemia- takes coq10 and fish oil with simvastatin 10 mg daily  Allergic rhinitis- with runny nose, denies nasal congestion, using flonase daily and this helps  Dry eyes- takes systane eye drops as needed  Glaucoma- takes Brinzolamide, vyzulta and timolol eye drop   Past Medical History:  Diagnosis Date  . Anemia   . Anxiety   . Apnea 05/2000   sleep study revealed mild upper airway obstruction & apnea during speel, no set recommendations for therapy  . Asthma   . Diverticulosis   . Dyspnea 12/15/09   spirometer normal & function actually worsens with nebk may have more of a "exercise-induced" type of bronchospasm   . Fatigue 07/2015  . GERD (gastroesophageal reflux disease)   . Glaucoma    both eyes  . Hyperlipidemia   . Hypertension   . Laryngeal paresis 09/12/2016  . Loss of weight 06/20/2016  . OSA (obstructive sleep apnea)   . Osteoporosis, senile   . Vocal cord dysfunction    per ENT, pt NOT to have elective intubation without disscussing with him   Past Surgical History:  Procedure Laterality Date  . CATARACT EXTRACTION W/ INTRAOCULAR LENS  IMPLANT, BILATERAL  2011   Dr. Charise Killian  . CERVICAL CONIZATION W/BX     due to cervical dysplasia  . COLONOSCOPY  02/2002   Diverticublosis   .  EYE SURGERY Right 02/2016   laser surgery for scar tissure  . nasolaryngoscopy  2001   reflux laryngitis  . SKIN CANCER EXCISION     ?[facility]  . TONSILLECTOMY    . UPPER GI ENDOSCOPY  05/2000   upper airway edema/changes that were consistent with GERD  . vocal fold cordotomy Left 2003   Dr. Denyse Dago [facility]    Allergies  Allergen Reactions  . Brimonidine Other (See Comments)  . Dorzolamide Hcl Itching    EYE PAIN     Outpatient Encounter Medications as of 04/21/2018  Medication Sig  . brinzolamide (AZOPT) 1 % ophthalmic suspension Place 1 drop into both eyes 3 (three) times daily.   . Calcium Carb-Cholecalciferol (CALCIUM 600+D3) 600-800 MG-UNIT TABS Take 1 tablet by mouth 2 (two) times daily.   . Coenzyme Q10 (COQ10) 200 MG CAPS Take 1 capsule by mouth daily.   . fluticasone (FLONASE) 50 MCG/ACT nasal spray Place into both nostrils daily.  Marland Kitchen losartan (COZAAR) 50 MG tablet TAKE ONE TABLET DAILY FOR BLOOD PRESSURE  . mirtazapine (REMERON SOL-TAB) 15 MG disintegrating tablet TAKE 1 TABLET BY MOUTH NIGHTLY TO HELP IMPROVE APPETITE AND FOR DEPRESSION  . Omega-3 Fatty Acids (FISH OIL) 1000 MG CAPS Take 1 capsule by mouth daily.   Vladimir Faster Glycol-Propyl Glycol (SYSTANE ULTRA PF) 0.4-0.3 % SOLN Place 1 drop into both eyes as needed (dry eyes).   . simvastatin (ZOCOR) 10 MG tablet ONE AT BEDTIME TO CONTROL CHOLESTEROL  . Timolol Maleate 0.5 % (DAILY) SOLN Place 1 drop into both eyes 2 (two) times daily.   . vitamin C (ASCORBIC ACID) 500 MG tablet Take 500 mg by mouth daily.  . Vitamin D, Cholecalciferol, 1000 UNITS CAPS Take 1 capsule by mouth 2 (two) times daily.   Marland Kitchen VYZULTA 0.024 % SOLN INSTILL 1 DROP INTO BOTH EYES EVERY DAY AT NIGHT  . [DISCONTINUED] alendronate (FOSAMAX) 70 MG tablet Take 1 tablet (70 mg total) by mouth every 7 (seven) days. Take with a full glass of water on an empty stomach.  . [DISCONTINUED] aspirin 81 MG tablet Take 81 mg by mouth daily.  . [DISCONTINUED] bimatoprost (LUMIGAN) 0.03 % ophthalmic solution Place 1 drop into the right eye at bedtime.   No facility-administered encounter medications on file as of 04/21/2018.     Review of Systems  Constitutional: Negative for appetite change, chills, fatigue and fever.  HENT: Positive for hearing loss, rhinorrhea and voice change. Negative for congestion, sinus pressure, sinus pain, sore throat and trouble swallowing.        Flonase  helps  Eyes: Positive for visual disturbance. Negative for pain, discharge and itching.       On eye drops for glaucoma and dry eyes  Respiratory: Negative for cough, shortness of breath and wheezing.   Cardiovascular: Negative for chest pain, palpitations and leg swelling.  Gastrointestinal: Negative for abdominal pain, constipation, diarrhea and nausea.  Genitourinary: Negative for dysuria, frequency and hematuria.       Does not have to get up at night to urinate, denies urinary incontinence  Musculoskeletal: Negative for arthralgias, back pain and gait problem.       No fall reported, no assistive device used  Skin: Negative for rash.  Neurological: Negative for dizziness, weakness, numbness and headaches.  Hematological: Bruises/bleeds easily.  Psychiatric/Behavioral: Negative for behavioral problems, decreased concentration, dysphoric mood and sleep disturbance.    Immunization History  Administered Date(s) Administered  . DTaP 08/08/2015  . Influenza, High  Dose Seasonal PF 08/13/2017  . Influenza-Unspecified 08/02/2015, 08/15/2016  . Pneumococcal Conjugate-13 08/02/2015  . Pneumococcal Polysaccharide-23 09/05/1999  . Tdap 08/08/2015   Pertinent  Health Maintenance Due  Topic Date Due  . INFLUENZA VACCINE  06/04/2018  . DEXA SCAN  Completed  . PNA vac Low Risk Adult  Completed   Fall Risk  10/21/2017 10/03/2017 03/14/2016  Falls in the past year? No No No  Risk for fall due to : Impaired vision;Medication side effect - -   Functional Status Survey:    Vitals:   04/21/18 0953  BP: (!) 150/70  Pulse: 70  Resp: 20  Temp: 97.9 F (36.6 C)  SpO2: 96%  Weight: 135 lb 12.8 oz (61.6 kg)  Height: 5\' 4"  (1.626 m)   Body mass index is 23.31 kg/m.   Wt Readings from Last 3 Encounters:  04/21/18 135 lb 12.8 oz (61.6 kg)  10/21/17 139 lb 9.6 oz (63.3 kg)  10/03/17 139 lb (63 kg)   Physical Exam  Constitutional: She is oriented to person, place, and time. She appears  well-developed and well-nourished. No distress.  HENT:  Head: Normocephalic and atraumatic.  Right Ear: External ear normal.  Left Ear: External ear normal.  Nose: Nose normal.  Mouth/Throat: Oropharynx is clear and moist. No oropharyngeal exudate.  Has upper dentures. adentulous to lower jaw  Eyes: Pupils are equal, round, and reactive to light. Conjunctivae and EOM are normal. Right eye exhibits no discharge. Left eye exhibits no discharge. No scleral icterus.  Has corrective glasses  Neck: Normal range of motion. Neck supple.  Cardiovascular: Normal rate, regular rhythm and intact distal pulses.  Pulmonary/Chest: Effort normal and breath sounds normal. No respiratory distress. She has no wheezes. She has no rales.  Abdominal: Soft. Bowel sounds are normal. There is no tenderness. There is no guarding.  Musculoskeletal: Normal range of motion. She exhibits no edema or tenderness.  Arthritis changes to her fingers  Lymphadenopathy:    She has no cervical adenopathy.  Neurological: She is alert and oriented to person, place, and time. She displays normal reflexes. No sensory deficit. She exhibits normal muscle tone.  10/03/17 MMSE 30/30  Skin: Skin is warm and dry. She is not diaphoretic.  Psychiatric: She has a normal mood and affect.    Labs reviewed: Recent Labs    11/06/17 0740  NA 141  K 4.2  CL 103  CO2 30  GLUCOSE 93  BUN 24  CREATININE 0.86  CALCIUM 9.7   Recent Labs    11/06/17 0740  AST 22  ALT 13  BILITOT 0.6  PROT 6.6   Recent Labs    11/06/17 0740  WBC 5.5  HGB 13.1  HCT 38.4  MCV 89.5  PLT 199   Lab Results  Component Value Date   TSH 1.61 01/30/2016   No results found for: HGBA1C Lab Results  Component Value Date   CHOL 203 (H) 11/06/2017   HDL 60 11/06/2017   LDLCALC 119 (H) 11/06/2017   TRIG 127 11/06/2017   CHOLHDL 3.4 11/06/2017    Significant Diagnostic Results in last 30 days:  No results found.   04/21/18 ekg- NSR 79 bpm,  normal PR interval, normal QTc interval.   Assessment/Plan  1. H/O vocal cord paralysis Supportive care. Reviewed ENT note from 04/10/17. Has f/u tomorrow.   2. Essential hypertension Continue losartan, add cmp to this am lab to review renal function  3. Allergic rhinitis due to other allergic trigger, unspecified  seasonality Continue flonase  4. Osteoporosis, unspecified osteoporosis type, unspecified pathological fracture presence Continue calcium and vitamin d supplement. Check vitamin d level. Refuses treatment for osteoporosis  5. Primary open angle glaucoma of both eyes, unspecified glaucoma stage Continue current eye drops, f/b ophthalmology  6. Immunization due Refuses immunization  7. Annual physical exam the patient was counseled regarding prevention of dental and periodontal disease, diet, regular sustained exercise for at least 30 minutes 5 times per week, the proper use of sunscreen and protective clothing and recommended schedule for cholesterol, thyroid and diabetes screening.  Immunization History  Administered Date(s) Administered  . DTaP 08/08/2015  . Influenza, High Dose Seasonal PF 08/13/2017  . Influenza-Unspecified 08/02/2015, 08/15/2016  . Pneumococcal Conjugate-13 08/02/2015  . Pneumococcal Polysaccharide-23 09/05/1999  . Tdap 08/08/2015   MMSE - Mini Mental State Exam 10/03/2017  Orientation to time 5  Orientation to Place 5  Registration 3  Attention/ Calculation 5  Recall 3  Language- name 2 objects 2  Language- repeat 1  Language- follow 3 step command 3  Language- read & follow direction 1  Write a sentence 1  Copy design 1  Total score 30   Depression screen [facility] 2/9 10/21/2017 10/03/2017 03/14/2016  Decreased Interest 0 0 0  Down, Depressed, Hopeless 0 1 0  PHQ - 2 Score 0 1 0    8. Chronic depression Continue remeron and check TSH  9. Dry eyes Continue systane eye drops as needed   Family/ staff Communication: reviewed care plan  with patient    Labs/tests ordered:  Pending lab results from this am   Blanchie Serve, MD Internal Medicine [place], [place] [address] Cell Phone (Monday-Friday 8 am - 5 pm): [phone] On Call: [phone] and follow prompts after 5 pm and on weekends Office Phone: [phone] Office Fax: [phone]

## 2018-04-22 DIAGNOSIS — J3802 Paralysis of vocal cords and larynx, bilateral: Secondary | ICD-10-CM | POA: Diagnosis not present

## 2018-04-22 LAB — COMPLETE METABOLIC PANEL WITH GFR
AG RATIO: 1.7 (calc) (ref 1.0–2.5)
ALBUMIN MSPROF: 4.1 g/dL (ref 3.6–5.1)
ALKALINE PHOSPHATASE (APISO): 100 U/L (ref 33–130)
ALT: 8 U/L (ref 6–29)
AST: 17 U/L (ref 10–35)
BILIRUBIN TOTAL: 0.3 mg/dL (ref 0.2–1.2)
BUN / CREAT RATIO: 21 (calc) (ref 6–22)
BUN: 19 mg/dL (ref 7–25)
CHLORIDE: 108 mmol/L (ref 98–110)
CO2: 24 mmol/L (ref 20–32)
Calcium: 9.4 mg/dL (ref 8.6–10.4)
Creat: 0.89 mg/dL — ABNORMAL HIGH (ref 0.60–0.88)
GFR, Est African American: 67 mL/min/{1.73_m2} (ref >=60)
GFR, Est Non African American: 58 mL/min/{1.73_m2} — ABNORMAL LOW (ref >=60)
GLOBULIN: 2.4 g/dL (ref 1.9–3.7)
Glucose, Bld: 96 mg/dL (ref 65–99)
POTASSIUM: 4.4 mmol/L (ref 3.5–5.3)
SODIUM: 147 mmol/L — AB (ref 135–146)
Total Protein: 6.5 g/dL (ref 6.1–8.1)

## 2018-04-22 LAB — TSH: TSH: 2.17 mIU/L (ref 0.40–4.50)

## 2018-04-22 LAB — LIPID PANEL
Cholesterol: 174 mg/dL (ref <200)
HDL: 60 mg/dL (ref >50)
LDL CHOLESTEROL (CALC): 94 mg/dL
NON-HDL CHOLESTEROL (CALC): 114 mg/dL (ref <130)
TRIGLYCERIDES: 104 mg/dL (ref <150)
Total CHOL/HDL Ratio: 2.9 (calc) (ref <5.0)

## 2018-04-22 LAB — TEST AUTHORIZATION

## 2018-04-22 LAB — VITAMIN D 25 HYDROXY (VIT D DEFICIENCY, FRACTURES): VIT D 25 HYDROXY: 36 ng/mL (ref 30–100)

## 2018-04-27 DIAGNOSIS — H35352 Cystoid macular degeneration, left eye: Secondary | ICD-10-CM | POA: Diagnosis not present

## 2018-05-11 DIAGNOSIS — Z961 Presence of intraocular lens: Secondary | ICD-10-CM | POA: Diagnosis not present

## 2018-05-11 DIAGNOSIS — H401113 Primary open-angle glaucoma, right eye, severe stage: Secondary | ICD-10-CM | POA: Diagnosis not present

## 2018-05-11 DIAGNOSIS — H401122 Primary open-angle glaucoma, left eye, moderate stage: Secondary | ICD-10-CM | POA: Diagnosis not present

## 2018-05-29 ENCOUNTER — Other Ambulatory Visit: Payer: Self-pay | Admitting: Internal Medicine

## 2018-05-29 DIAGNOSIS — I10 Essential (primary) hypertension: Secondary | ICD-10-CM

## 2018-05-29 DIAGNOSIS — E785 Hyperlipidemia, unspecified: Secondary | ICD-10-CM

## 2018-06-08 DIAGNOSIS — H35352 Cystoid macular degeneration, left eye: Secondary | ICD-10-CM | POA: Diagnosis not present

## 2018-06-17 ENCOUNTER — Encounter: Payer: Self-pay | Admitting: Internal Medicine

## 2018-06-17 DIAGNOSIS — H401122 Primary open-angle glaucoma, left eye, moderate stage: Secondary | ICD-10-CM | POA: Diagnosis not present

## 2018-06-17 DIAGNOSIS — Z961 Presence of intraocular lens: Secondary | ICD-10-CM | POA: Diagnosis not present

## 2018-06-17 DIAGNOSIS — H401113 Primary open-angle glaucoma, right eye, severe stage: Secondary | ICD-10-CM | POA: Diagnosis not present

## 2018-07-28 DIAGNOSIS — H35352 Cystoid macular degeneration, left eye: Secondary | ICD-10-CM | POA: Diagnosis not present

## 2018-08-06 DIAGNOSIS — Z23 Encounter for immunization: Secondary | ICD-10-CM | POA: Diagnosis not present

## 2018-08-20 ENCOUNTER — Other Ambulatory Visit: Payer: Self-pay | Admitting: *Deleted

## 2018-08-20 DIAGNOSIS — E785 Hyperlipidemia, unspecified: Secondary | ICD-10-CM

## 2018-08-20 DIAGNOSIS — I10 Essential (primary) hypertension: Secondary | ICD-10-CM

## 2018-08-20 MED ORDER — SIMVASTATIN 10 MG PO TABS: ORAL_TABLET | ORAL | 0 refills | Status: DC

## 2018-08-20 MED ORDER — LOSARTAN POTASSIUM 50 MG PO TABS: ORAL_TABLET | ORAL | 0 refills | Status: DC

## 2018-08-20 NOTE — Telephone Encounter (Signed)
[facility] 

## 2018-08-20 NOTE — Telephone Encounter (Signed)
[facility] [employer] 

## 2018-09-07 ENCOUNTER — Other Ambulatory Visit: Payer: Self-pay | Admitting: *Deleted

## 2018-09-07 DIAGNOSIS — F418 Other specified anxiety disorders: Secondary | ICD-10-CM

## 2018-09-07 MED ORDER — MIRTAZAPINE 15 MG PO TBDP: ORAL_TABLET | ORAL | 3 refills | Status: DC

## 2018-09-07 NOTE — Telephone Encounter (Signed)
[facility] 

## 2018-09-21 DIAGNOSIS — H401122 Primary open-angle glaucoma, left eye, moderate stage: Secondary | ICD-10-CM | POA: Diagnosis not present

## 2018-09-21 DIAGNOSIS — H35352 Cystoid macular degeneration, left eye: Secondary | ICD-10-CM | POA: Diagnosis not present

## 2018-09-21 DIAGNOSIS — H401113 Primary open-angle glaucoma, right eye, severe stage: Secondary | ICD-10-CM | POA: Diagnosis not present

## 2018-09-21 DIAGNOSIS — Z961 Presence of intraocular lens: Secondary | ICD-10-CM | POA: Diagnosis not present

## 2018-10-05 ENCOUNTER — Non-Acute Institutional Stay: Payer: Medicare Other

## 2018-10-05 VITALS — BP 144/70 | HR 78 | Temp 97.9°F | Ht 64.0 in | Wt 133.0 lb

## 2018-10-05 DIAGNOSIS — Z0000 Encounter for general adult medical examination without abnormal findings: Secondary | ICD-10-CM

## 2018-10-05 NOTE — Patient Instructions (Addendum)
Angela [name] , Thank you for taking time to come for your Medicare Wellness Visit. I appreciate your ongoing commitment to your health goals. Please review the following plan we discussed and let me know if I can assist you in the future.   Screening recommendations/referrals: Colonoscopy excluded, over age 82 Mammogram excluded, over age 69 Bone Density up to date Recommended yearly ophthalmology/optometry visit for glaucoma screening and checkup Recommended yearly dental visit for hygiene and checkup  Vaccinations: Influenza vaccine up to date Pneumococcal vaccine up to date, completed Tdap vaccine up to date, due 08/07/2025 Shingles vaccine due, declined    Advanced directives: In chart  Conditions/risks identified: none  Next appointment: [facility] 11/19/2018 @ 1:30pm   Preventive Care 65 Years and Older, Female Preventive care refers to lifestyle choices and visits with your health care provider that can promote health and wellness. What does preventive care include?  A yearly physical exam. This is also called an annual well check.  Dental exams once or twice a year.  Routine eye exams. Ask your health care provider how often you should have your eyes checked.  Personal lifestyle choices, including:  Daily care of your teeth and gums.  Regular physical activity.  Eating a healthy diet.  Avoiding tobacco and drug use.  Limiting alcohol use.  Practicing safe sex.  Taking low-dose aspirin every day.  Taking vitamin and mineral supplements as recommended by your health care provider. What happens during an annual well check? The services and screenings done by your health care provider during your annual well check will depend on your age, overall health, lifestyle risk factors, and family history of disease. Counseling  Your health care provider may ask you questions about your:  Alcohol use.  Tobacco use.  Drug use.  Emotional well-being.  Home and  relationship well-being.  Sexual activity.  Eating habits.  History of falls.  Memory and ability to understand (cognition).  Work and work Statistician.  Reproductive health. Screening  You may have the following tests or measurements:  Height, weight, and BMI.  Blood pressure.  Lipid and cholesterol levels. These may be checked every 5 years, or more frequently if you are over 58 years old.  Skin check.  Lung cancer screening. You may have this screening every year starting at age 26 if you have a 30-pack-year history of smoking and currently smoke or have quit within the past 15 years.  Fecal occult blood test (FOBT) of the stool. You may have this test every year starting at age 90.  Flexible sigmoidoscopy or colonoscopy. You may have a sigmoidoscopy every 5 years or a colonoscopy every 10 years starting at age 18.  Hepatitis C blood test.  Hepatitis B blood test.  Sexually transmitted disease (STD) testing.  Diabetes screening. This is done by checking your blood sugar (glucose) after you have not eaten for a while (fasting). You may have this done every 1-3 years.  Bone density scan. This is done to screen for osteoporosis. You may have this done starting at age 66.  Mammogram. This may be done every 1-2 years. Talk to your health care provider about how often you should have regular mammograms. Talk with your health care provider about your test results, treatment options, and if necessary, the need for more tests. Vaccines  Your health care provider may recommend certain vaccines, such as:  Influenza vaccine. This is recommended every year.  Tetanus, diphtheria, and acellular pertussis (Tdap, Td) vaccine. You may need a  Td booster every 10 years.  Zoster vaccine. You may need this after age 50.  Pneumococcal 13-valent conjugate (PCV13) vaccine. One dose is recommended after age 47.  Pneumococcal polysaccharide (PPSV23) vaccine. One dose is recommended after  age 46. Talk to your health care provider about which screenings and vaccines you need and how often you need them. This information is not intended to replace advice given to you by your health care provider. Make sure you discuss any questions you have with your health care provider. Document Released: 11/17/2015 Document Revised: 07/10/2016 Document Reviewed: 08/22/2015 Elsevier Interactive Patient Education  2017 [place] Prevention in the Home Falls can cause injuries. They can happen to people of all ages. There are many things you can do to make your home safe and to help prevent falls. What can I do on the outside of my home?  Regularly fix the edges of walkways and driveways and fix any cracks.  Remove anything that might make you trip as you walk through a door, such as a raised step or threshold.  Trim any bushes or trees on the path to your home.  Use bright outdoor lighting.  Clear any walking paths of anything that might make someone trip, such as rocks or tools.  Regularly check to see if handrails are loose or broken. Make sure that both sides of any steps have handrails.  Any raised decks and porches should have guardrails on the edges.  Have any leaves, snow, or ice cleared regularly.  Use sand or salt on walking paths during winter.  Clean up any spills in your garage right away. This includes oil or grease spills. What can I do in the bathroom?  Use night lights.  Install grab bars by the toilet and in the tub and shower. Do not use towel bars as grab bars.  Use non-skid mats or decals in the tub or shower.  If you need to sit down in the shower, use a plastic, non-slip stool.  Keep the floor dry. Clean up any water that spills on the floor as soon as it happens.  Remove soap buildup in the tub or shower regularly.  Attach bath mats securely with double-sided non-slip rug tape.  Do not have throw rugs and other things on the floor that can  make you trip. What can I do in the bedroom?  Use night lights.  Make sure that you have a light by your bed that is easy to reach.  Do not use any sheets or blankets that are too big for your bed. They should not hang down onto the floor.  Have a firm chair that has side arms. You can use this for support while you get dressed.  Do not have throw rugs and other things on the floor that can make you trip. What can I do in the kitchen?  Clean up any spills right away.  Avoid walking on wet floors.  Keep items that you use a lot in easy-to-reach places.  If you need to reach something above you, use a strong step stool that has a grab bar.  Keep electrical cords out of the way.  Do not use floor polish or wax that makes floors slippery. If you must use wax, use non-skid floor wax.  Do not have throw rugs and other things on the floor that can make you trip. What can I do with my stairs?  Do not leave any items on the stairs.  Make sure that there are handrails on both sides of the stairs and use them. Fix handrails that are broken or loose. Make sure that handrails are as long as the stairways.  Check any carpeting to make sure that it is firmly attached to the stairs. Fix any carpet that is loose or worn.  Avoid having throw rugs at the top or bottom of the stairs. If you do have throw rugs, attach them to the floor with carpet tape.  Make sure that you have a light switch at the top of the stairs and the bottom of the stairs. If you do not have them, ask someone to add them for you. What else can I do to help prevent falls?  Wear shoes that:  Do not have high heels.  Have rubber bottoms.  Are comfortable and fit you well.  Are closed at the toe. Do not wear sandals.  If you use a stepladder:  Make sure that it is fully opened. Do not climb a closed stepladder.  Make sure that both sides of the stepladder are locked into place.  Ask someone to hold it for you,  if possible.  Clearly mark and make sure that you can see:  Any grab bars or handrails.  First and last steps.  Where the edge of each step is.  Use tools that help you move around (mobility aids) if they are needed. These include:  Canes.  Walkers.  Scooters.  Crutches.  Turn on the lights when you go into a dark area. Replace any light bulbs as soon as they burn out.  Set up your furniture so you have a clear path. Avoid moving your furniture around.  If any of your floors are uneven, fix them.  If there are any pets around you, be aware of where they are.  Review your medicines with your doctor. Some medicines can make you feel dizzy. This can increase your chance of falling. Ask your doctor what other things that you can do to help prevent falls. This information is not intended to replace advice given to you by your health care provider. Make sure you discuss any questions you have with your health care provider. Document Released: 08/17/2009 Document Revised: 03/28/2016 Document Reviewed: 11/25/2014 Elsevier Interactive Patient Education  2017 [employer].

## 2018-10-05 NOTE — Progress Notes (Signed)
Subjective:   Angela [name] is a 84 y.o. female who presents for Medicare Annual (Subsequent) preventive examination at [facility]  Last AWV-10/03/2017      Objective:     Vitals: BP (!) 144/70 (BP Location: Left Arm, Patient Position: Sitting)   Pulse 78   Temp 97.9 F (36.6 C) (Oral)   Ht 5\' 4"  (1.626 m)   Wt 133 lb (60.3 kg)   BMI 22.83 kg/m   Body mass index is 22.83 kg/m.  Advanced Directives 10/05/2018 10/03/2017 04/10/2017 09/12/2016 03/14/2016 12/07/2015 09/21/2015  Does Patient Have a Medical Advance Directive? Yes Yes Yes Yes Yes Yes Yes  Type of Paramedic of [place];Living will [place];Living will;Out of facility DNR (pink MOST or yellow form) [place];Living will [place];Living will [place];Living will [place];Living will [place];Living will  Does patient want to make changes to medical advance directive? No - Patient declined No - Patient declined - - - - -  Copy of [place] in Chart? No - copy requested Yes Yes Yes Yes Yes No - copy requested  Pre-existing out of facility DNR order (yellow form or pink MOST form) - Yellow form placed in chart (order not valid for inpatient use);Pink MOST form placed in chart (order not valid for inpatient use) - - - - -    Tobacco Social History   Tobacco Use  Smoking Status Never Smoker  Smokeless Tobacco Never Used     Counseling given: Not Answered   Clinical Intake:  Pre-visit preparation completed: No  Pain : No/denies pain     Diabetes: No  How often do you need to have someone help you when you read instructions, pamphlets, or other written materials from your doctor or pharmacy?: 1 - Never What is the last grade level you completed in school?: 11th grade  Interpreter Needed?: No  Information entered by :: Tyson Dense, RN  Past Medical History:  Diagnosis Date  . Anemia   . Anxiety   . Apnea 05/2000   sleep study revealed mild upper airway obstruction & apnea during speel, no set recommendations for therapy  . Asthma   . Diverticulosis   . Dyspnea 12/15/09   spirometer normal & function actually worsens with nebk may have more of a "exercise-induced" type of bronchospasm   . Fatigue 07/2015  . GERD (gastroesophageal reflux disease)   . Glaucoma    both eyes  . Hyperlipidemia   . Hypertension   . Laryngeal paresis 09/12/2016  . Loss of weight 06/20/2016  . OSA (obstructive sleep apnea)   . Osteoporosis, senile   . Vocal cord dysfunction    per ENT, pt NOT to have elective intubation without disscussing with him   Past Surgical History:  Procedure Laterality Date  . CATARACT EXTRACTION W/ INTRAOCULAR LENS  IMPLANT, BILATERAL  2011   Dr. Charise Killian  . CERVICAL CONIZATION W/BX     due to cervical dysplasia  . COLONOSCOPY  02/2002   Diverticublosis   . EYE SURGERY Right 02/2016   laser surgery for scar tissure  . nasolaryngoscopy  2001   reflux laryngitis  . SKIN CANCER EXCISION     ?[facility]  . TONSILLECTOMY    . UPPER GI ENDOSCOPY  05/2000   upper airway edema/changes that were consistent with GERD  . vocal fold cordotomy Left 2003  Dr. Denyse Dago [facility]   Family History  Problem Relation Age of Onset  . Melanoma Mother   . Transient ischemic attack Father   . Multiple sclerosis Sister   . Cancer Brother   . Colon cancer Neg Hx    Social History   Socioeconomic History  . Marital status: Widowed    Spouse name: Not on file  . Number of children: 3  . Years of education: Not on file  . Highest education level: Not on file  Occupational History  . Occupation: Retired  Scientific laboratory technician  . Financial resource strain: Somewhat hard  . Food insecurity:    Worry: Never true    Inability: Never true  . Transportation needs:    Medical: No    Non-medical: No  Tobacco Use  .  Smoking status: Never Smoker  . Smokeless tobacco: Never Used  Substance and Sexual Activity  . Alcohol use: No  . Drug use: No  . Sexual activity: Never  Lifestyle  . Physical activity:    Days per week: 3 days    Minutes per session: 30 min  . Stress: Rather much  Relationships  . Social connections:    Talks on phone: More than three times a week    Gets together: More than three times a week    Attends religious service: 1 to 4 times per year    Active member of club or organization: No    Attends meetings of clubs or organizations: Never    Relationship status: Widowed  Other Topics Concern  . Not on file  Social History Narrative   Lives at [facility]   Widowed   Previously smoked stopped 1986   Alcohol none   Caffeine occasionally   Exercise walk    Outpatient Encounter Medications as of 10/05/2018  Medication Sig  . brinzolamide (AZOPT) 1 % ophthalmic suspension Place 1 drop into both eyes 3 (three) times daily.   . Calcium Carb-Cholecalciferol (CALCIUM 600+D3) 600-800 MG-UNIT TABS Take 1 tablet by mouth 2 (two) times daily.   . Coenzyme Q10 (COQ10) 200 MG CAPS Take 1 capsule by mouth daily.   . fluticasone (FLONASE) 50 MCG/ACT nasal spray Place into both nostrils daily.  Marland Kitchen ketorolac (ACULAR) 0.5 % ophthalmic solution Place 1 drop into both eyes 4 (four) times daily.  Marland Kitchen losartan (COZAAR) 50 MG tablet Take one tablet by mouth once daily for blood pressure  . loteprednol (LOTEMAX) 0.2 % SUSP 1 drop 4 (four) times daily. Left eye  . mirtazapine (REMERON SOL-TAB) 15 MG disintegrating tablet TAKE 1 TABLET BY MOUTH NIGHTLY TO HELP IMPROVE APPETITE AND FOR DEPRESSION  . Netarsudil Dimesylate (RHOPRESSA) 0.02 % SOLN Apply 1 drop to eye at bedtime. Left eye  . Omega-3 Fatty Acids (FISH OIL) 1000 MG CAPS Take 1 capsule by mouth daily.   Vladimir Faster Glycol-Propyl Glycol (SYSTANE ULTRA PF) 0.4-0.3 % SOLN Place 1 drop into both eyes as needed (dry eyes).   .  simvastatin (ZOCOR) 10 MG tablet Take one tablet by mouth at bedtime to control cholesterol  . Timolol Maleate 0.5 % (DAILY) SOLN Place 1 drop into both eyes 2 (two) times daily.   . vitamin C (ASCORBIC ACID) 500 MG tablet Take 500 mg by mouth daily.  . Vitamin D, Cholecalciferol, 1000 UNITS CAPS Take 1 capsule by mouth 2 (two) times daily.   Marland Kitchen VYZULTA 0.024 % SOLN INSTILL 1 DROP INTO BOTH EYES EVERY DAY AT NIGHT   No  facility-administered encounter medications on file as of 10/05/2018.     Activities of Daily Living In your present state of health, do you have any difficulty performing the following activities: 10/05/2018  Hearing? N  Vision? N  Difficulty concentrating or making decisions? Y  Walking or climbing stairs? N  Dressing or bathing? N  Doing errands, shopping? N  Preparing Food and eating ? N  Using the Toilet? N  In the past six months, have you accidently leaked urine? N  Do you have problems with loss of bowel control? N  Managing your Medications? N  Managing your Finances? N  Housekeeping or managing your Housekeeping? N  Some recent data might be hidden    Patient Care Team: Blanchie Serve, MD as PCP - General (Internal Medicine) Ander Slade, Carlisle Beers, MD as Referring Physician (Ophthalmology) Jarome Matin, MD as Consulting Physician (Dermatology) Ernestine Conrad, MD as Consulting Physician (Otolaryngology) Ngetich, Nelda Bucks, NP as Nurse Practitioner (Family Medicine)    Assessment:   This is a routine wellness examination for [place].  Exercise Activities and Dietary recommendations Current Exercise Habits: Home exercise routine, Type of exercise: walking, Time (Minutes): 30, Frequency (Times/Week): 3, Weekly Exercise (Minutes/Week): 90, Intensity: Mild, Exercise limited by: None identified  Goals   None     Fall Risk Fall Risk  10/05/2018 10/21/2017 10/03/2017 03/14/2016  Falls in the past year? 0 No No No  Number falls in past yr: 0 - - -  Injury with  Fall? 0 - - -  Risk for fall due to : - Impaired vision;Medication side effect - -   Is the patient's home free of loose throw rugs in walkways, pet beds, electrical cords, etc?   yes      Grab bars in the bathroom? yes      Handrails on the stairs?   yes      Adequate lighting?   yes   Depression Screen PHQ 2/9 Scores 10/05/2018 10/21/2017 10/03/2017 03/14/2016  PHQ - 2 Score 0 0 1 0     Cognitive Function MMSE - Mini Mental State Exam 10/05/2018 10/03/2017  Orientation to time 4 5  Orientation to Place 5 5  Registration 3 3  Attention/ Calculation 5 5  Recall 2 3  Language- name 2 objects 2 2  Language- repeat 1 1  Language- follow 3 step command 3 3  Language- read & follow direction 1 1  Write a sentence 1 1  Copy design 1 1  Total score 28 30        Immunization History  Administered Date(s) Administered  . DTaP 08/08/2015  . Influenza Whole 08/06/2018  . Influenza, High Dose Seasonal PF 08/13/2017  . Influenza-Unspecified 08/02/2015, 08/15/2016  . Pneumococcal Conjugate-13 08/02/2015  . Pneumococcal Polysaccharide-23 09/05/1999  . Tdap 08/08/2015    Qualifies for Shingles Vaccine? Yes, educated and declined  Screening Tests Health Maintenance  Topic Date Due  . TETANUS/TDAP  08/07/2025  . INFLUENZA VACCINE  Completed  . DEXA SCAN  Completed  . PNA vac Low Risk Adult  Completed    Cancer Screenings: Lung: Low Dose CT Chest recommended if Age 55-80 years, 30 pack-year currently smoking OR have quit w/in 15years. Patient does not qualify. Breast:  Up to date on Mammogram? Yes   Up to date of Bone Density/Dexa? Yes Colorectal: up to date  Additional Screenings:  Hepatitis C Screening: declined     Plan:    I have personally reviewed and addressed the Medicare  Annual Wellness questionnaire and have noted the following in the patient's chart:  A. Medical and social history B. Use of alcohol, tobacco or illicit drugs  C. Current medications and  supplements D. Functional ability and status E.  Nutritional status F.  Physical activity G. Advance directives H. List of other physicians I.  Hospitalizations, surgeries, and ER visits in previous 12 months J.  [place] to include hearing, vision, cognitive, depression L. Referrals and appointments - none  In addition, I have reviewed and discussed with patient certain preventive protocols, quality metrics, and best practice recommendations. A written personalized care plan for preventive services as well as general preventive health recommendations were provided to patient.  See attached scanned questionnaire for additional information.   Signed,   Tyson Dense, RN Nurse Health Advisor  Patient concerns: None

## 2018-10-08 ENCOUNTER — Emergency Department (HOSPITAL_COMMUNITY): Payer: Medicare Other

## 2018-10-08 ENCOUNTER — Encounter (HOSPITAL_COMMUNITY): Payer: Self-pay

## 2018-10-08 ENCOUNTER — Other Ambulatory Visit: Payer: Self-pay

## 2018-10-08 ENCOUNTER — Emergency Department (HOSPITAL_COMMUNITY)
Admission: EM | Admit: 2018-10-08 | Discharge: 2018-10-08 | Disposition: A | Discharge status: Home / Self Care | Payer: Medicare Other | Attending: Emergency Medicine | Admitting: Emergency Medicine

## 2018-10-08 DIAGNOSIS — J45909 Unspecified asthma, uncomplicated: Secondary | ICD-10-CM | POA: Insufficient documentation

## 2018-10-08 DIAGNOSIS — Z79899 Other long term (current) drug therapy: Secondary | ICD-10-CM | POA: Insufficient documentation

## 2018-10-08 DIAGNOSIS — I10 Essential (primary) hypertension: Secondary | ICD-10-CM | POA: Insufficient documentation

## 2018-10-08 DIAGNOSIS — R918 Other nonspecific abnormal finding of lung field: Secondary | ICD-10-CM | POA: Diagnosis not present

## 2018-10-08 DIAGNOSIS — R0602 Shortness of breath: Secondary | ICD-10-CM | POA: Diagnosis present

## 2018-10-08 LAB — BASIC METABOLIC PANEL
Anion gap: 9 (ref 5–15)
BUN: 21 mg/dL (ref 8–23)
CO2: 29 mmol/L (ref 22–32)
Calcium: 9.4 mg/dL (ref 8.9–10.3)
Chloride: 103 mmol/L (ref 98–111)
Creatinine, Ser: 0.98 mg/dL (ref 0.44–1.00)
GFR calc Af Amer: 59 mL/min — ABNORMAL LOW (ref >60)
GFR calc non Af Amer: 51 mL/min — ABNORMAL LOW (ref >60)
Glucose, Bld: 111 mg/dL — ABNORMAL HIGH (ref 70–99)
Potassium: 4.2 mmol/L (ref 3.5–5.1)
Sodium: 141 mmol/L (ref 135–145)

## 2018-10-08 LAB — CBC
HCT: 42 % (ref 36.0–46.0)
Hemoglobin: 13 g/dL (ref 12.0–15.0)
MCH: 29.6 pg (ref 26.0–34.0)
MCHC: 31 g/dL (ref 30.0–36.0)
MCV: 95.7 fL (ref 80.0–100.0)
Platelets: 187 10*3/uL (ref 150–400)
RBC: 4.39 MIL/uL (ref 3.87–5.11)
RDW: 12.7 % (ref 11.5–15.5)
WBC: 5.1 10*3/uL (ref 4.0–10.5)
nRBC: 0 % (ref 0.0–0.2)

## 2018-10-08 LAB — I-STAT TROPONIN, ED: Troponin i, poc: 0 ng/mL (ref 0.00–0.08)

## 2018-10-08 LAB — TROPONIN I: Troponin I: <0.03 ng/mL (ref <0.03)

## 2018-10-08 MED ORDER — FUROSEMIDE 20 MG PO TABS: 10.0000 mg | ORAL_TABLET | Freq: Every day | ORAL | 0 refills | Status: DC

## 2018-10-08 NOTE — ED Notes (Addendum)
Pt has auditory wheezes but reports it is her baseline after having larynx surgery. Daughter reports pt sounds "hoarse" and that the "wheezing" is a little worse than her baseline.

## 2018-10-08 NOTE — ED Provider Notes (Signed)
[place] EMERGENCY DEPARTMENT Provider Note   CSN: [id number] Arrival date & time: 10/08/18  1759     History   Chief Complaint Chief Complaint  Patient presents with  . Hypertension  . Shortness of Breath    HPI Angela [name] is a 84 y.o. female.  HPI  She presents for evaluation of high blood pressure, found on routine surveillance at her facility.  She is taking her usual medicines including antihypertensive medication daily.  For the last several months she has had some dyspnea with exertion but not dyspnea at rest or nocturnally.  She has been eating well.  She has not had any recent changes in her vision.  She denies paresthesias or weaknesses.  She is here with her daughter, she lives in an independent having facility.  She denies any other recent problems.  There are no other known modifying factors.   Past Medical History:  Diagnosis Date  . Anemia   . Anxiety   . Apnea 05/2000   sleep study revealed mild upper airway obstruction & apnea during speel, no set recommendations for therapy  . Asthma   . Diverticulosis   . Dyspnea 12/15/09   spirometer normal & function actually worsens with nebk may have more of a "exercise-induced" type of bronchospasm   . Fatigue 07/2015  . GERD (gastroesophageal reflux disease)   . Glaucoma    both eyes  . Hyperlipidemia   . Hypertension   . Laryngeal paresis 09/12/2016  . Loss of weight 06/20/2016  . OSA (obstructive sleep apnea)   . Osteoporosis, senile   . Vocal cord dysfunction    per ENT, pt NOT to have elective intubation without disscussing with him    Patient Active Problem List   Diagnosis Date Noted  . Primary open angle glaucoma (POAG) of both eyes 04/21/2018  . Chronic depression 04/21/2018  . Dry eyes 04/21/2018  . Anxiety 12/08/2015  . Osteoporosis 09/22/2015  . GERD (gastroesophageal reflux disease) 09/22/2015  . Obstructive sleep apnea 09/22/2015  . Reflux laryngitis 09/22/2015  .  Depression with anxiety 09/22/2015  . HTN (hypertension) 09/22/2015  . Osteoarthritis 09/22/2015  . Balance problem 09/22/2015  . Dizziness and giddiness 09/22/2015  . Hyperlipidemia 09/22/2015  . Allergic rhinitis 09/22/2015  . Vocal fold paralysis, bilateral 09/22/2015  . Primary open angle glaucoma 12/21/2014  . Cloudy posterior capsule 08/17/2014  . Entropion of lower eyelid 02/21/2014  . Exposure keratitis 12/26/2013  . Entropion, spastic 12/26/2013  . 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 Blepharoptosis 10/12/2013  . Age-related macular degeneration 06/03/2012  . Blepharitis 06/03/2012  . Pseudoaphakia 06/03/2012  . H/O nonmelanoma skin cancer 06/03/2012  . H/O vocal cord paralysis 06/03/2012    Past Surgical History:  Procedure Laterality Date  . CATARACT EXTRACTION W/ INTRAOCULAR LENS  IMPLANT, BILATERAL  2011   Dr. Charise Killian  . CERVICAL CONIZATION W/BX     due to cervical dysplasia  . COLONOSCOPY  02/2002   Diverticublosis   . EYE SURGERY Right 02/2016   laser surgery for scar tissure  . nasolaryngoscopy  2001   reflux laryngitis  . SKIN CANCER EXCISION     ?BCC  . TONSILLECTOMY    . UPPER GI ENDOSCOPY  05/2000   upper airway edema/changes that were consistent with GERD  . vocal fold cordotomy Left 2003   Dr. Denyse Dago [facility]     OB History   None      Home Medications    Prior to  Admission medications   Medication Sig Start Date End Date Taking? Authorizing Provider  brinzolamide (AZOPT) 1 % ophthalmic suspension Place 1 drop into both eyes 3 (three) times daily.    Yes [provider]  Calcium Carb-Cholecalciferol (CALCIUM 600+D3) 600-800 MG-UNIT TABS Take 1 tablet by mouth 2 (two) times daily.    Yes [provider]  Coenzyme Q10 (COQ10) 200 MG CAPS Take 1 capsule by mouth daily.    Yes [provider]  ketorolac (ACULAR) 0.5 % ophthalmic solution Place 1 drop into both eyes 4 (four) times daily.   Yes [provider]    losartan (COZAAR) 50 MG tablet Take one tablet by mouth once daily for blood pressure 08/20/18  Yes Mast, Man X, NP  loteprednol (LOTEMAX) 0.2 % SUSP Place 1 drop into the left eye 5 (five) times daily. Left eye    Yes [provider]  mirtazapine (REMERON SOL-TAB) 15 MG disintegrating tablet TAKE 1 TABLET BY MOUTH NIGHTLY TO HELP IMPROVE APPETITE AND FOR DEPRESSION Patient taking differently: Take 15 mg by mouth at bedtime. TO HELP IMPROVE APPETITE AND FOR DEPRESSION 09/07/18  Yes Ngetich, Dinah C, NP  Netarsudil Dimesylate (RHOPRESSA) 0.02 % SOLN Apply 1 drop to eye at bedtime. Left eye   Yes [provider]  Omega-3 Fatty Acids (FISH OIL) 1000 MG CAPS Take 1 capsule by mouth daily.    Yes [provider]  simvastatin (ZOCOR) 10 MG tablet Take one tablet by mouth at bedtime to control cholesterol 08/20/18  Yes Mast, Man X, NP  Timolol Maleate 0.5 % (DAILY) SOLN Place 1 drop into both eyes 2 (two) times daily.    Yes [provider]  vitamin C (ASCORBIC ACID) 500 MG tablet Take 500 mg by mouth daily.   Yes [provider]  Vitamin D, Cholecalciferol, 1000 UNITS CAPS Take 1 capsule by mouth 2 (two) times daily.    Yes [provider]  VYZULTA 0.024 % SOLN Place 1 drop into both eyes at bedtime.  03/17/18  Yes [provider]  furosemide (LASIX) 20 MG tablet Take 0.5 tablets (10 mg total) by mouth daily. 10/08/18   Daleen Bo, MD    Family History Family History  Problem Relation Age of Onset  . Melanoma Mother   . Transient ischemic attack Father   . Multiple sclerosis Sister   . Cancer Brother   . Colon cancer Neg Hx     Social History Social History   Tobacco Use  . Smoking status: Never Smoker  . Smokeless tobacco: Current User    Types: Chew, Snuff  Substance Use Topics  . Alcohol use: No  . Drug use: No     Allergies   Brimonidine and Dorzolamide hcl   Review of Systems Review of Systems  All other  systems reviewed and are negative.    Physical Exam Updated Vital Signs BP (!) 176/79   Pulse 63   Temp 98.4 F (36.9 C) (Oral)   Resp 20   Ht 5\' 4"  (1.626 m)   Wt 60.3 kg   SpO2 97%   BMI 22.83 kg/m   Physical Exam  Constitutional: She is oriented to person, place, and time. She appears well-developed. She does not appear ill. No distress.  Elderly, frail  HENT:  Head: Normocephalic and atraumatic.  Very mild upper airway whistling noise with inspiration consistent with known laryngeal disease.  Eyes: Pupils are equal, round, and reactive to light. Conjunctivae and EOM are normal.  Neck: Normal range of motion and phonation normal. Neck supple.  Cardiovascular: Normal rate and regular rhythm.  Pulmonary/Chest: Effort normal and breath sounds normal. No stridor. No respiratory distress. She has no wheezes. She has no rales. She exhibits no tenderness.  Abdominal: Soft. She exhibits no distension. There is no tenderness. There is no guarding.  Musculoskeletal: Normal range of motion.  Neurological: She is alert and oriented to person, place, and time. She exhibits normal muscle tone.  Skin: Skin is warm and dry.  Psychiatric: She has a normal mood and affect. Her behavior is normal. Judgment and thought content normal.  Nursing note and vitals reviewed.    ED Treatments / Results  Labs (all labs ordered are listed, but only abnormal results are displayed) Labs Reviewed  BASIC METABOLIC PANEL - Abnormal; Notable for the following components:      Result Value   Glucose, Bld 111 (*)    GFR calc non Af Amer 51 (*)    GFR calc Af Amer 59 (*)    All other components within normal limits  CBC  TROPONIN I  I-STAT TROPONIN, ED    EKG EKG Interpretation  Date/Time:  Thursday October 08 2018 19:08:37 EST Ventricular Rate:  79 PR Interval:  164 QRS Duration: 78 QT Interval:  372 QTC Calculation: 426 R Axis:   4 Text Interpretation:  Normal sinus rhythm with sinus  arrhythmia Cannot rule out Anterior infarct , age undetermined Abnormal ECG No old tracing to compare Confirmed by Daleen Bo [phone]) on 10/08/2018 7:07:05 PM   Radiology Dg Chest 2 View  Result Date: 10/08/2018 CLINICAL DATA:  Hypertension EXAM: CHEST - 2 VIEW COMPARISON:  None. FINDINGS: Pulmonary hyperinflation. Normal size heart. Slight uncoiling of the thoracic aorta without aneurysm. S-shaped scoliosis of the thoracolumbar spine. IMPRESSION: No active cardiopulmonary disease. Mild pulmonary hyperinflation. S-shaped scoliosis of the thoracolumbar spine. Electronically Signed   By: Ashley Royalty M.D.   On: 10/08/2018 19:01    Procedures Procedures (including critical care time)  Medications Ordered in ED Medications - No data to display   Initial Impression / Assessment and Plan / ED Course  I have reviewed the triage vital signs and the nursing notes.  Pertinent labs & imaging results that were available during my care of the patient were reviewed by me and considered in my medical decision making (see chart for details).  Clinical Course as of Oct 09 2211  Thu Oct 08, 2018  1908 No infiltrate or CHF, images reviewed by me  DG Chest 2 View [EW]  1908 Normal  I-stat troponin, ED [EW]  2146 Normal  I-stat troponin, ED [EW]  2147 Troponin I - ONCE - STAT [EW]  2147 Normal except GFR low, glucose high  Basic metabolic panel(!) [EW]  2831 Normal  CBC [EW]  2147 Normal  DG Chest 2 View [EW]  2210 I discussed appropriate diuretic therapy with the hospital pharmacist.  We have chosen to discharge the patient on furosemide as it is a somewhat different moiety than the dorzolamide which she has an allergy to which causes itching.  I discussed the possibility of reaction with torsemide with the patient and her daughter.   [EW]    Clinical Course User Index [EW] Daleen Bo, MD     Patient Vitals for the past 24 hrs:  BP Temp Temp src Pulse Resp SpO2 Height Weight  10/08/18  2145 (!) 176/79 - - 63 20 97 % - -  10/08/18 2130 Marland Kitchen)  163/73 - - 63 18 96 % - -  10/08/18 2030 (!) 149/67 - - (!) 58 19 97 % - -  10/08/18 2000 (!) 143/68 - - 63 18 97 % - -  10/08/18 1955 (!) 174/82 - - - - - - -  10/08/18 1954 (!) 178/84 - - - - - - -  10/08/18 1953 (!) 184/82 - - - - - - -  10/08/18 1930 (!) 168/67 - - 64 (!) 22 97 % - -  10/08/18 1911 (!) 196/74 - - 73 14 99 % - -  10/08/18 1815 - - - - - - 5\' 4"  (1.626 m) 60.3 kg  10/08/18 1811 (!) 206/96 98.4 F (36.9 C) Oral 83 16 97 % - -    10:10 PM Reevaluation with update and discussion. After initial assessment and treatment, an updated evaluation reveals she remains comfortable has no further complaints.  Findings discussed with patient and family member, all questions were answered. Daleen Bo   Medical Decision Making: Mild hypertension without signs of endorgan damage.  Blood pressure improved spontaneously.  Mild peripheral edema is present.  Doubt hypertensive urgency, metabolic instability or impending vascular collapse.  CRITICAL CARE-no Performed by: Daleen Bo   Nursing Notes Reviewed/ Care Coordinated Applicable Imaging Reviewed Interpretation of Laboratory Data incorporated into ED treatment  The patient appears reasonably screened and/or stabilized for discharge and I doubt any other medical condition or other [facility] requiring further screening, evaluation, or treatment in the ED at this time prior to discharge.  Plan: Home Medications-continue home medications; Home Treatments-increase potassium in diet; return here if the recommended treatment, does not improve the symptoms; Recommended follow up-PCP checkup 1 week and as needed     Final Clinical Impressions(s) / ED Diagnoses   Final diagnoses:  Hypertension, unspecified type    ED Discharge Orders         Ordered    furosemide (LASIX) 20 MG tablet  Daily     10/08/18 2208           Daleen Bo, MD 10/08/18 2212

## 2018-10-08 NOTE — ED Triage Notes (Signed)
Pt from [place], driven to hospital by daughter w/ a c/o HTN, SOB, and BLE. Pt denies pain,headache, CP, or SOB. No N/V/D. No dizziness or LOC. Pt had her BP checked at her nursing facility on Monday and she was found to be HTN at that time. Today her BP was 210/95. Equal grip strength, no facial droop, or arm drift noted.

## 2018-10-08 NOTE — ED Notes (Signed)
Repeated left arm 174/82 second time

## 2018-10-08 NOTE — ED Notes (Signed)
Discharge instructions and prescription discussed with Pt. Pt verbalized understanding. Pt stable and ambulatory.    

## 2018-10-08 NOTE — Discharge Instructions (Signed)
We are adding a diuretic pill, furosemide, 10 mg, each day to help with blood pressure and fluid retention.  This can drop your potassium level so try to eat foods which contain potassium every day.  See your medical provider in about a week for checkup.

## 2018-10-15 ENCOUNTER — Non-Acute Institutional Stay: Payer: Medicare Other | Admitting: Nurse Practitioner

## 2018-10-15 ENCOUNTER — Encounter: Payer: Self-pay | Admitting: Nurse Practitioner

## 2018-10-15 DIAGNOSIS — F329 Major depressive disorder, single episode, unspecified: Secondary | ICD-10-CM | POA: Diagnosis not present

## 2018-10-15 DIAGNOSIS — K219 Gastro-esophageal reflux disease without esophagitis: Secondary | ICD-10-CM

## 2018-10-15 DIAGNOSIS — F32A Depression, unspecified: Secondary | ICD-10-CM

## 2018-10-15 DIAGNOSIS — I10 Essential (primary) hypertension: Secondary | ICD-10-CM

## 2018-10-15 MED ORDER — MIRTAZAPINE 30 MG PO TABS: 30.0000 mg | ORAL_TABLET | Freq: Every day | ORAL | 2 refills | Status: DC

## 2018-10-15 NOTE — Assessment & Plan Note (Signed)
stable °

## 2018-10-15 NOTE — Assessment & Plan Note (Addendum)
Blood pressure is controlled, continue Losartan 50mg  qd, Furosemide 10mg  qd, continue 32 Oz/day F/u BMP prior to the next appointment. Resume ASA 81mg  qd po for cardiovascular risk reduction.

## 2018-10-15 NOTE — Assessment & Plan Note (Addendum)
Still anxious about her health, trial of increase  Mirtazapine 30mg  qd.

## 2018-10-15 NOTE — Patient Instructions (Addendum)
Monitor blood pressure at home, call for uncontrolled blood pressure 150-140/100-90 mmHg. Continue 32Oz fluid restriction. Increase Mirtazapine to 30mg  qd for depression/anxiety, obtain BMP prior to the next appointment.

## 2018-10-15 NOTE — Progress Notes (Signed)
Location:   clinic [place]   Place of Service:  Clinic (12)clinic [facility] Provider: Marlana Latus NP  Code Status: DNR Goals of Care: [place] 10/08/2018  Does Patient Have a Medical Advance Directive? Yes  Type of Paramedic of [place];Living will  Does patient want to make changes to medical advance directive? -  Copy of [place] in Chart? No - copy requested  Pre-existing out of facility DNR order (yellow form or pink MOST form) -     Chief Complaint  Patient presents with  . Hospitalization Follow-up    F/u-BP issues    HPI: Patient is a 84 y.o. female seen today for medical management of chronic diseases.  F/u ED visit 10/08/18 for elevated blood pressure, normalized today. CBC BMP troponin unremarkable.    The patient has history of Hx of depression/anxiety,  her mood is not well controlled, sleeps and eats well, on Mirtazapine 15mg  qd. HTN, blood pressure is controlled Losartan 50mg , Furosemide 10mg  qd.    Past Medical History:  Diagnosis Date  . Anemia   . Anxiety   . Apnea 05/2000   sleep study revealed mild upper airway obstruction & apnea during speel, no set recommendations for therapy  . Asthma   . Diverticulosis   . Dyspnea 12/15/09   spirometer normal & function actually worsens with nebk may have more of a "exercise-induced" type of bronchospasm   . Fatigue 07/2015  . GERD (gastroesophageal reflux disease)   . Glaucoma    both eyes  . Hyperlipidemia   . Hypertension   . Laryngeal paresis 09/12/2016  . Loss of weight 06/20/2016  . OSA (obstructive sleep apnea)   . Osteoporosis, senile   . Vocal cord dysfunction    per ENT, pt NOT to have elective intubation without disscussing with him    Past Surgical History:  Procedure Laterality Date  . CATARACT EXTRACTION W/ INTRAOCULAR LENS  IMPLANT, BILATERAL  2011   Dr. Charise Killian  . CERVICAL CONIZATION W/BX     due to cervical dysplasia  . COLONOSCOPY  02/2002   Diverticublosis   . EYE SURGERY Right 02/2016   laser surgery for scar tissure  . nasolaryngoscopy  2001   reflux laryngitis  . SKIN CANCER EXCISION     ?BCC  . TONSILLECTOMY    . UPPER GI ENDOSCOPY  05/2000   upper airway edema/changes that were consistent with GERD  . vocal fold cordotomy Left 2003   Dr. Denyse Dago [facility]    Allergies  Allergen Reactions  . Brimonidine Other (See Comments)  . Dorzolamide Hcl Itching    EYE PAIN    Allergies as of 10/15/2018      Reactions   Brimonidine Other (See Comments)   Dorzolamide Hcl Itching   EYE PAIN      Medication List       Accurate as of October 15, 2018 11:59 PM. Always use your most recent med list.        aspirin EC 81 MG tablet Take 81 mg by mouth daily.   brinzolamide 1 % ophthalmic suspension Commonly known as:  AZOPT Place 1 drop into both eyes 3 (three) times daily.   CALCIUM 600+D3 600-800 MG-UNIT Tabs Generic drug:  Calcium Carb-Cholecalciferol Take 1 tablet by mouth 2 (two) times daily.   CoQ10 200 MG Caps Take 1 capsule by mouth daily.   Fish Oil 1000 MG Caps Take 1 capsule by mouth daily.   furosemide  20 MG tablet Commonly known as:  LASIX Take 0.5 tablets (10 mg total) by mouth daily.   ketorolac 0.5 % ophthalmic solution Commonly known as:  ACULAR Place 1 drop into both eyes 4 (four) times daily.   losartan 50 MG tablet Commonly known as:  COZAAR Take one tablet by mouth once daily for blood pressure   loteprednol 0.2 % Susp Commonly known as:  LOTEMAX Place 1 drop into the left eye 5 (five) times daily. Left eye   mirtazapine 30 MG tablet Commonly known as:  REMERON Take 1 tablet (30 mg total) by mouth at bedtime.   RHOPRESSA 0.02 % Soln Generic drug:  Netarsudil Dimesylate Apply 1 drop to eye at bedtime. Left eye   simvastatin 10 MG tablet Commonly known as:  ZOCOR Take one tablet by mouth at bedtime to control cholesterol   Timolol Maleate 0.5 % (DAILY) Soln Place 1  drop into both eyes 2 (two) times daily.   vitamin C 500 MG tablet Commonly known as:  ASCORBIC ACID Take 500 mg by mouth daily.   Vitamin D (Cholecalciferol) 25 MCG (1000 UT) Caps Take 1 capsule by mouth 2 (two) times daily.   VYZULTA 0.024 % Soln Generic drug:  Latanoprostene Bunod Place 1 drop into both eyes at bedtime.       Review of Systems:  Review of Systems  Constitutional: Negative for activity change, appetite change, chills, diaphoresis, fatigue and fever.  HENT: Positive for hearing loss and voice change. Negative for congestion.        Denture. Vocal cord paralysis.   Eyes: Positive for visual disturbance.       Left eye is worse  Respiratory: Negative for cough, shortness of breath and wheezing.   Cardiovascular: Negative for chest pain, palpitations and leg swelling.  Gastrointestinal: Negative for abdominal distention, abdominal pain, constipation, diarrhea, nausea and vomiting.  Genitourinary: Negative for difficulty urinating, dysuria and urgency.  Musculoskeletal: Positive for gait problem.  Neurological: Negative for dizziness, speech difficulty, weakness and headaches.  Psychiatric/Behavioral: Negative for agitation, behavioral problems, hallucinations and sleep disturbance. The patient is not nervous/anxious.     Health Maintenance  Topic Date Due  . TETANUS/TDAP  08/07/2025  . INFLUENZA VACCINE  Completed  . DEXA SCAN  Completed  . PNA vac Low Risk Adult  Completed    Physical Exam: Vitals:   10/15/18 1300  BP: 128/72  Pulse: 79  Resp: 20  Temp: 98.2 F (36.8 C)  SpO2: 94%  Weight: 130 lb 3.2 oz (59.1 kg)  Height: 5\' 4"  (1.626 m)   Body mass index is 22.35 kg/m. Physical Exam Constitutional:      Appearance: Normal appearance.  HENT:     Head: Normocephalic and atraumatic.     Nose: Nose normal.     Mouth/Throat:     Mouth: Mucous membranes are dry.  Eyes:     Extraocular Movements: Extraocular movements intact.     Pupils:  Pupils are equal, round, and reactive to light.  Neck:     Musculoskeletal: Normal range of motion and neck supple.  Cardiovascular:     Rate and Rhythm: Normal rate and regular rhythm.     Heart sounds: No murmur.  Pulmonary:     Effort: Pulmonary effort is normal.     Breath sounds: Normal breath sounds.  Abdominal:     General: Abdomen is flat. There is no distension.     Palpations: Abdomen is soft.     Tenderness: There  is no abdominal tenderness. There is no guarding.  Musculoskeletal:        General: No swelling.     Comments: Ambulates independently.   Skin:    General: Skin is warm and dry.  Neurological:     General: No focal deficit present.     Mental Status: She is alert and oriented to person, place, and time.     Cranial Nerves: No cranial nerve deficit.     Motor: No weakness.     Coordination: Coordination normal.     Gait: Gait normal.  Psychiatric:        Mood and Affect: Mood normal.        Behavior: Behavior normal.     Labs reviewed: Basic Metabolic Panel: Recent Labs    11/06/17 0740 04/20/18 1601 10/08/18 1843  NA 141 147* 141  K 4.2 4.4 4.2  CL 103 108 103  CO2 30 24 29   GLUCOSE 93 96 111*  BUN 24 19 21   CREATININE 0.86 0.89* 0.98  CALCIUM 9.7 9.4 9.4  TSH  --  2.17  --    Liver Function Tests: Recent Labs    11/06/17 0740 04/20/18 1601  AST 22 17  ALT 13 8  BILITOT 0.6 0.3  PROT 6.6 6.5   No results for input(s): LIPASE, AMYLASE in the last 8760 hours. No results for input(s): AMMONIA in the last 8760 hours. CBC: Recent Labs    11/06/17 0740 10/08/18 1843  WBC 5.5 5.1  HGB 13.1 13.0  HCT 38.4 42.0  MCV 89.5 95.7  PLT 199 187   Lipid Panel: Recent Labs    11/06/17 0740 04/20/18 1601  CHOL 203* 174  HDL 60 60  LDLCALC 119* 94  TRIG 127 104  CHOLHDL 3.4 2.9   No results found for: HGBA1C  Procedures since last visit: Dg Chest 2 View  Result Date: 10/08/2018 CLINICAL DATA:  Hypertension EXAM: CHEST - 2 VIEW  COMPARISON:  None. FINDINGS: Pulmonary hyperinflation. Normal size heart. Slight uncoiling of the thoracic aorta without aneurysm. S-shaped scoliosis of the thoracolumbar spine. IMPRESSION: No active cardiopulmonary disease. Mild pulmonary hyperinflation. S-shaped scoliosis of the thoracolumbar spine. Electronically Signed   By: Ashley Royalty M.D.   On: 10/08/2018 19:01    Assessment/Plan  HTN (hypertension) Blood pressure is controlled, continue Losartan 50mg  qd, Furosemide 10mg  qd, continue 32 Oz/day F/u BMP prior to the next appointment. Resume ASA 81mg  qd po for cardiovascular risk reduction.   GERD (gastroesophageal reflux disease) stable  Chronic depression Still anxious about her health, trial of increase  Mirtazapine 30mg  qd.    Labs/tests ordered: BMP prior to the next appointment.   Next appt:  11/19/2018

## 2018-10-16 ENCOUNTER — Encounter: Payer: Self-pay | Admitting: Nurse Practitioner

## 2018-11-12 DIAGNOSIS — F064 Anxiety disorder due to known physiological condition: Secondary | ICD-10-CM | POA: Diagnosis not present

## 2018-11-16 DIAGNOSIS — I10 Essential (primary) hypertension: Secondary | ICD-10-CM | POA: Diagnosis not present

## 2018-11-17 ENCOUNTER — Encounter: Payer: Self-pay | Admitting: Internal Medicine

## 2018-11-17 DIAGNOSIS — I10 Essential (primary) hypertension: Secondary | ICD-10-CM

## 2018-11-17 LAB — BASIC METABOLIC PANEL
BUN/Creatinine Ratio: 21 (calc) (ref 6–22)
BUN: 22 mg/dL (ref 7–25)
CO2: 32 mmol/L (ref 20–32)
Calcium: 9.5 mg/dL (ref 8.6–10.4)
Chloride: 103 mmol/L (ref 98–110)
Creat: 1.04 mg/dL — ABNORMAL HIGH (ref 0.60–0.88)
GLUCOSE: 99 mg/dL (ref 65–99)
Potassium: 4.2 mmol/L (ref 3.5–5.3)
Sodium: 143 mmol/L (ref 135–146)

## 2018-11-19 ENCOUNTER — Encounter: Payer: Self-pay | Admitting: Family

## 2018-11-19 ENCOUNTER — Non-Acute Institutional Stay: Payer: Medicare Other | Admitting: Nurse Practitioner

## 2018-11-19 ENCOUNTER — Encounter: Payer: Self-pay | Admitting: Nurse Practitioner

## 2018-11-19 DIAGNOSIS — I10 Essential (primary) hypertension: Secondary | ICD-10-CM | POA: Diagnosis not present

## 2018-11-19 DIAGNOSIS — K219 Gastro-esophageal reflux disease without esophagitis: Secondary | ICD-10-CM | POA: Diagnosis not present

## 2018-11-19 DIAGNOSIS — F419 Anxiety disorder, unspecified: Secondary | ICD-10-CM | POA: Diagnosis not present

## 2018-11-19 NOTE — Assessment & Plan Note (Signed)
Stable

## 2018-11-19 NOTE — Progress Notes (Signed)
Location:   clinic [facility]   Place of Service:  Clinic (12)clinic  Provider: Marlana Latus NP  Code Status: DNR Goals of Care: [place] 11/19/2018  Does Patient Have a Medical Advance Directive? Yes  Type of Advance Directive Living will;Healthcare Power of Attorney  Does patient want to make changes to medical advance directive? No - Patient declined  Copy of [place] in Chart? -  Pre-existing out of facility DNR order (yellow form or pink MOST form) -     Chief Complaint  Patient presents with  . Medical Management of Chronic Issues    6 mo f/u    HPI: Patient is a 85 y.o. female seen today for medical management of chronic diseases.     The patient has history of depression, her mood is stable, sleeps and eats at her baseline, she is in her usual state of health, on Mirtazapine 25m qd. Hx of HTN, blood pressure is controlled on Losartan 592mqd, Furosemide 1071md, on ASA 29m32md Zocor 10mg51m cardiovascular risk reduction. 11/16/18 Na 143, K 4.2, Bun 22, creat 1.04 Past Medical History:  Diagnosis Date  . Anemia   . Anxiety   . Apnea 05/2000   sleep study revealed mild upper airway obstruction & apnea during speel, no set recommendations for therapy  . Asthma   . Diverticulosis   . Dyspnea 12/15/09   spirometer normal & function actually worsens with nebk may have more of a "exercise-induced" type of bronchospasm   . Fatigue 07/2015  . GERD (gastroesophageal reflux disease)   . Glaucoma    both eyes  . Hyperlipidemia   . Hypertension   . Laryngeal paresis 09/12/2016  . Loss of weight 06/20/2016  . OSA (obstructive sleep apnea)   . Osteoporosis, senile   . Vocal cord dysfunction    per ENT, pt NOT to have elective intubation without disscussing with him    Past Surgical History:  Procedure Laterality Date  . CATARACT EXTRACTION W/ INTRAOCULAR LENS  IMPLANT, BILATERAL  2011   Dr. Epes Charise KillianERVICAL CONIZATION W/BX     due to cervical  dysplasia  . COLONOSCOPY  02/2002   Diverticublosis   . EYE SURGERY Right 02/2016   laser surgery for scar tissure  . nasolaryngoscopy  2001   reflux laryngitis  . SKIN CANCER EXCISION     ?BCC  . TONSILLECTOMY    . UPPER GI ENDOSCOPY  05/2000   upper airway edema/changes that were consistent with GERD  . vocal fold cordotomy Left 2003   [facility]  Allergen Reactions  . Brimonidine Other (See Comments)  . Dorzolamide Hcl Itching    EYE PAIN    Allergies as of 11/19/2018      Reactions   Brimonidine Other (See Comments)   Dorzolamide Hcl Itching   EYE PAIN      Medication List       Accurate as of November 19, 2018 11:59 PM. Always use your most recent med list.        aspirin EC 81 MG tablet Take 81 mg by mouth daily.   brinzolamide 1 % ophthalmic suspension Commonly known as:  AZOPT Place 1 drop into both eyes 3 (three) times daily.   CALCIUM 600+D3 600-800 MG-UNIT Tabs Generic drug:  Calcium Carb-Cholecalciferol Take 1 tablet by mouth 2 (two) times daily.   CoQ10 200 MG Caps Take 1 capsule by mouth daily.  Fish Oil 1000 MG Caps Take 1 capsule by mouth daily.   furosemide 20 MG tablet Commonly known as:  LASIX Take 0.5 tablets (10 mg total) by mouth daily.   ketorolac 0.5 % ophthalmic solution Commonly known as:  ACULAR Place 1 drop into the left eye 4 (four) times daily.   losartan 50 MG tablet Commonly known as:  COZAAR Take one tablet by mouth once daily for blood pressure   loteprednol 0.2 % Susp Commonly known as:  LOTEMAX Place 1 drop into the left eye 5 (five) times daily. Left eye   mirtazapine 30 MG tablet Commonly known as:  REMERON Take 1 tablet (30 mg total) by mouth at bedtime.   RHOPRESSA 0.02 % Soln Generic drug:  Netarsudil Dimesylate Apply 1 drop to eye at bedtime. Left eye   simvastatin 10 MG tablet Commonly known as:  ZOCOR Take one tablet by mouth at bedtime to control cholesterol   Timolol  Maleate 0.5 % (DAILY) Soln Place 1 drop into both eyes 2 (two) times daily.   vitamin C 500 MG tablet Commonly known as:  ASCORBIC ACID Take 500 mg by mouth daily.   Vitamin D (Cholecalciferol) 25 MCG (1000 UT) Caps Take 1 capsule by mouth 2 (two) times daily.   VYZULTA 0.024 % Soln Generic drug:  Latanoprostene Bunod Place 1 drop into both eyes at bedtime.       Review of Systems:  Review of Systems  Constitutional: Negative for activity change, appetite change, chills, diaphoresis, fatigue, fever and unexpected weight change.  HENT: Positive for hearing loss and voice change. Negative for congestion.        Vocal cord paralysis  Eyes: Positive for visual disturbance.       Left worse than the right.   Respiratory: Negative for cough, shortness of breath and wheezing.   Gastrointestinal: Negative for abdominal distention, abdominal pain, constipation, diarrhea, nausea and vomiting.  Genitourinary: Negative for difficulty urinating, dysuria and urgency.  Musculoskeletal: Positive for arthralgias and gait problem.  Skin: Negative for color change and pallor.  Neurological: Negative for dizziness, speech difficulty, weakness and headaches.  Psychiatric/Behavioral: Negative for agitation, behavioral problems, hallucinations and sleep disturbance. The patient is not nervous/anxious.     Health Maintenance  Topic Date Due  . TETANUS/TDAP  08/07/2025  . INFLUENZA VACCINE  Completed  . DEXA SCAN  Completed  . PNA vac Low Risk Adult  Completed    Physical Exam: Vitals:   11/19/18 1334  BP: 118/72  Pulse: 82  Resp: 18  Temp: 98.5 F (36.9 C)  TempSrc: Oral  SpO2: 94%  Weight: 134 lb (60.8 kg)  Height: 5' 4"  (1.626 m)   Body mass index is 23 kg/m. Physical Exam Constitutional:      General: She is not in acute distress.    Appearance: Normal appearance. She is normal weight. She is not ill-appearing, toxic-appearing or diaphoretic.  HENT:     Head: Normocephalic and  atraumatic.     Nose: Nose normal. No congestion or rhinorrhea.     Mouth/Throat:     Mouth: Mucous membranes are moist.  Eyes:     Extraocular Movements: Extraocular movements intact.     Pupils: Pupils are equal, round, and reactive to light.  Neck:     Musculoskeletal: Normal range of motion and neck supple.  Cardiovascular:     Rate and Rhythm: Normal rate and regular rhythm.     Pulses: Normal pulses.     Heart  sounds: Normal heart sounds. No murmur.  Pulmonary:     Effort: Pulmonary effort is normal.     Breath sounds: Normal breath sounds.  Abdominal:     General: There is no distension.     Palpations: Abdomen is soft.     Tenderness: There is no abdominal tenderness. There is no guarding or rebound.  Musculoskeletal:     Right lower leg: No edema.     Left lower leg: No edema.     Comments: Furniture walking.   Skin:    General: Skin is warm and dry.     Coloration: Skin is not pale.     Findings: No erythema or rash.  Neurological:     General: No focal deficit present.     Mental Status: She is alert. Mental status is at baseline.     Cranial Nerves: No cranial nerve deficit.     Motor: No weakness.     Coordination: Coordination normal.     Gait: Gait abnormal.     Comments: Oriented to person and place.   Psychiatric:        Mood and Affect: Mood normal.        Behavior: Behavior normal.        Thought Content: Thought content normal.     Labs reviewed: Basic Metabolic Panel: Recent Labs    04/20/18 1601 10/08/18 1843 11/16/18 0000  NA 147* 141 143  K 4.4 4.2 4.2  CL 108 103 103  CO2 24 29 32  GLUCOSE 96 111* 99  BUN 19 21 22   CREATININE 0.89* 0.98 1.04*  CALCIUM 9.4 9.4 9.5  TSH 2.17  --   --    Liver Function Tests: Recent Labs    04/20/18 1601  AST 17  ALT 8  BILITOT 0.3  PROT 6.5   No results for input(s): LIPASE, AMYLASE in the last 8760 hours. No results for input(s): AMMONIA in the last 8760 hours. CBC: Recent Labs     10/08/18 1843  WBC 5.1  HGB 13.0  HCT 42.0  MCV 95.7  PLT 187   Lipid Panel: Recent Labs    04/20/18 1601  CHOL 174  HDL 60  LDLCALC 94  TRIG 104  CHOLHDL 2.9   No results found for: HGBA1C  Procedures since last visit: No results found.   Assessment/Plan  HTN (hypertension) Blood pressure is in control, continue Losartan 66m qd, Furosemide 159mqd, 11/16/18 Na 143, K 4.2, Bun 22, creat 1.04. continue ASA 8172mocor 36m62m for cardiovascular risk reduction. Will observe renal function since her creat is trending up 1.04 11/16/18, 0.98 10/08/18, 0.89 04/19/18, up date CMP eGFR   GERD (gastroesophageal reflux disease) Stable.   Anxiety Stable, continue Mirtazapine 30mg42m    Labs/tests ordered: CMP eGFR  Next appt:  4 months

## 2018-11-19 NOTE — Assessment & Plan Note (Signed)
Stable, continue Mirtazapine 30mg  qd.

## 2018-11-19 NOTE — Assessment & Plan Note (Addendum)
Blood pressure is in control, continue Losartan 69m qd, Furosemide 190mqd, 11/16/18 Na 143, K 4.2, Bun 22, creat 1.04. continue ASA 8114mocor 61m63m for cardiovascular risk reduction. Will observe renal function since her creat is trending up 1.04 11/16/18, 0.98 10/08/18, 0.89 04/19/18, up date CMP eGFR

## 2018-11-19 NOTE — Patient Instructions (Addendum)
Encourage oral hydration. F/u in clinic [facility] 4 months CMP eGFR prior to the next appointment.

## 2018-11-20 ENCOUNTER — Other Ambulatory Visit: Payer: Self-pay | Admitting: *Deleted

## 2018-11-20 MED ORDER — FUROSEMIDE 20 MG PO TABS: 10.0000 mg | ORAL_TABLET | Freq: Every day | ORAL | 0 refills | Status: DC

## 2018-11-20 NOTE — Telephone Encounter (Signed)
Daughter, [name] called and stated that patient saw you yesterday and she was suppose to get a refill on her Furosemide.   Pended and sent to [facility] for approval due to [place]

## 2018-12-24 ENCOUNTER — Other Ambulatory Visit: Payer: Self-pay | Admitting: *Deleted

## 2018-12-24 DIAGNOSIS — I10 Essential (primary) hypertension: Secondary | ICD-10-CM

## 2018-12-24 MED ORDER — LOSARTAN POTASSIUM 25 MG PO TABS: ORAL_TABLET | ORAL | 1 refills | Status: DC

## 2018-12-24 NOTE — Telephone Encounter (Signed)
Patient daughter, [name] requested refill. Stated that they need 25mg  instead of 50mg  because the pharmacy cannot get 50mg . Rx faxed.

## 2018-12-29 ENCOUNTER — Other Ambulatory Visit: Payer: Self-pay | Admitting: Nurse Practitioner

## 2018-12-29 DIAGNOSIS — H35352 Cystoid macular degeneration, left eye: Secondary | ICD-10-CM | POA: Diagnosis not present

## 2019-01-08 IMAGING — DX DG CHEST 2V
2 series · 2 of 2 positions shown · non-contrast
Comparison: None.

CLINICAL DATA: Hypertension

EXAM:
CHEST - 2 VIEW

[w chest pa]
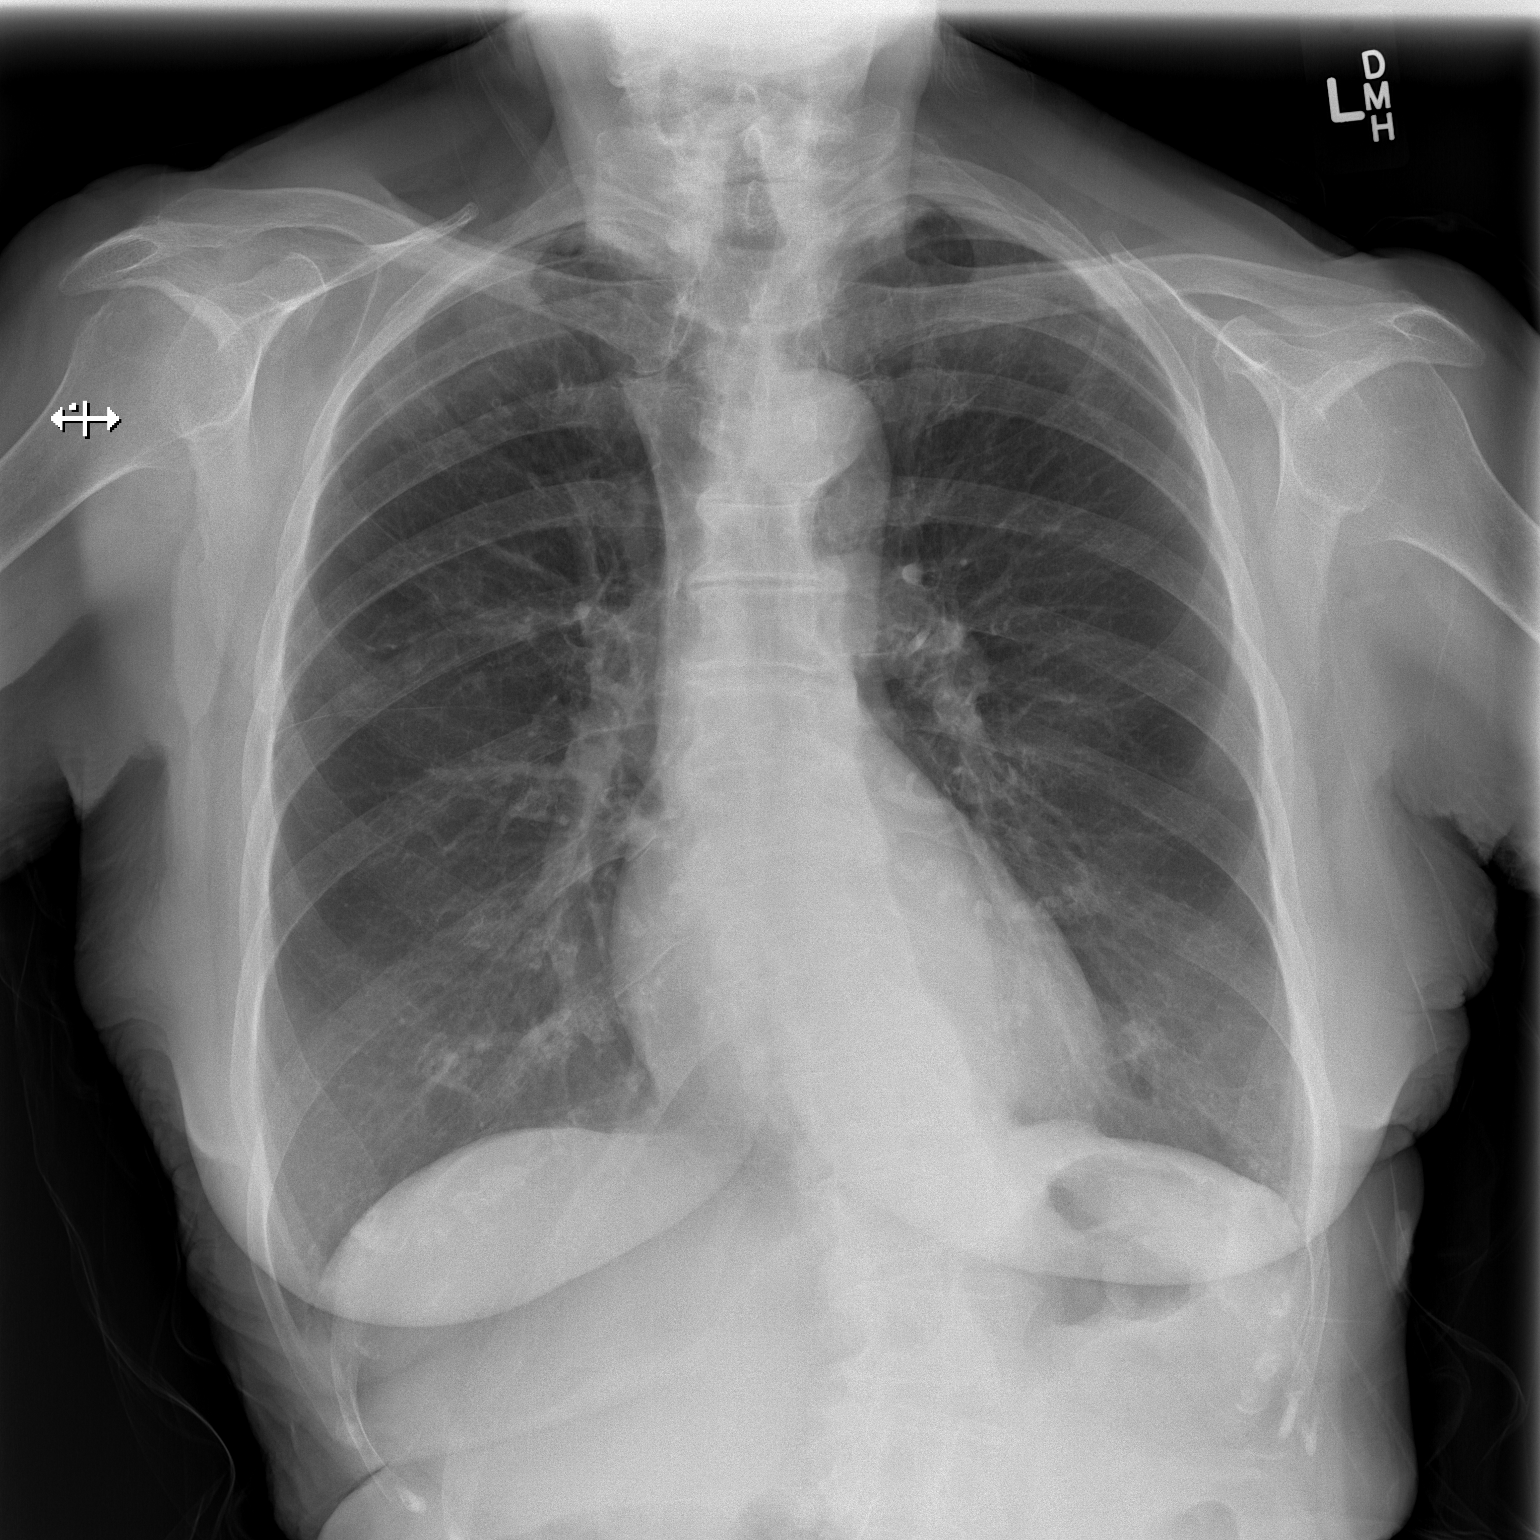

[w chest lat]
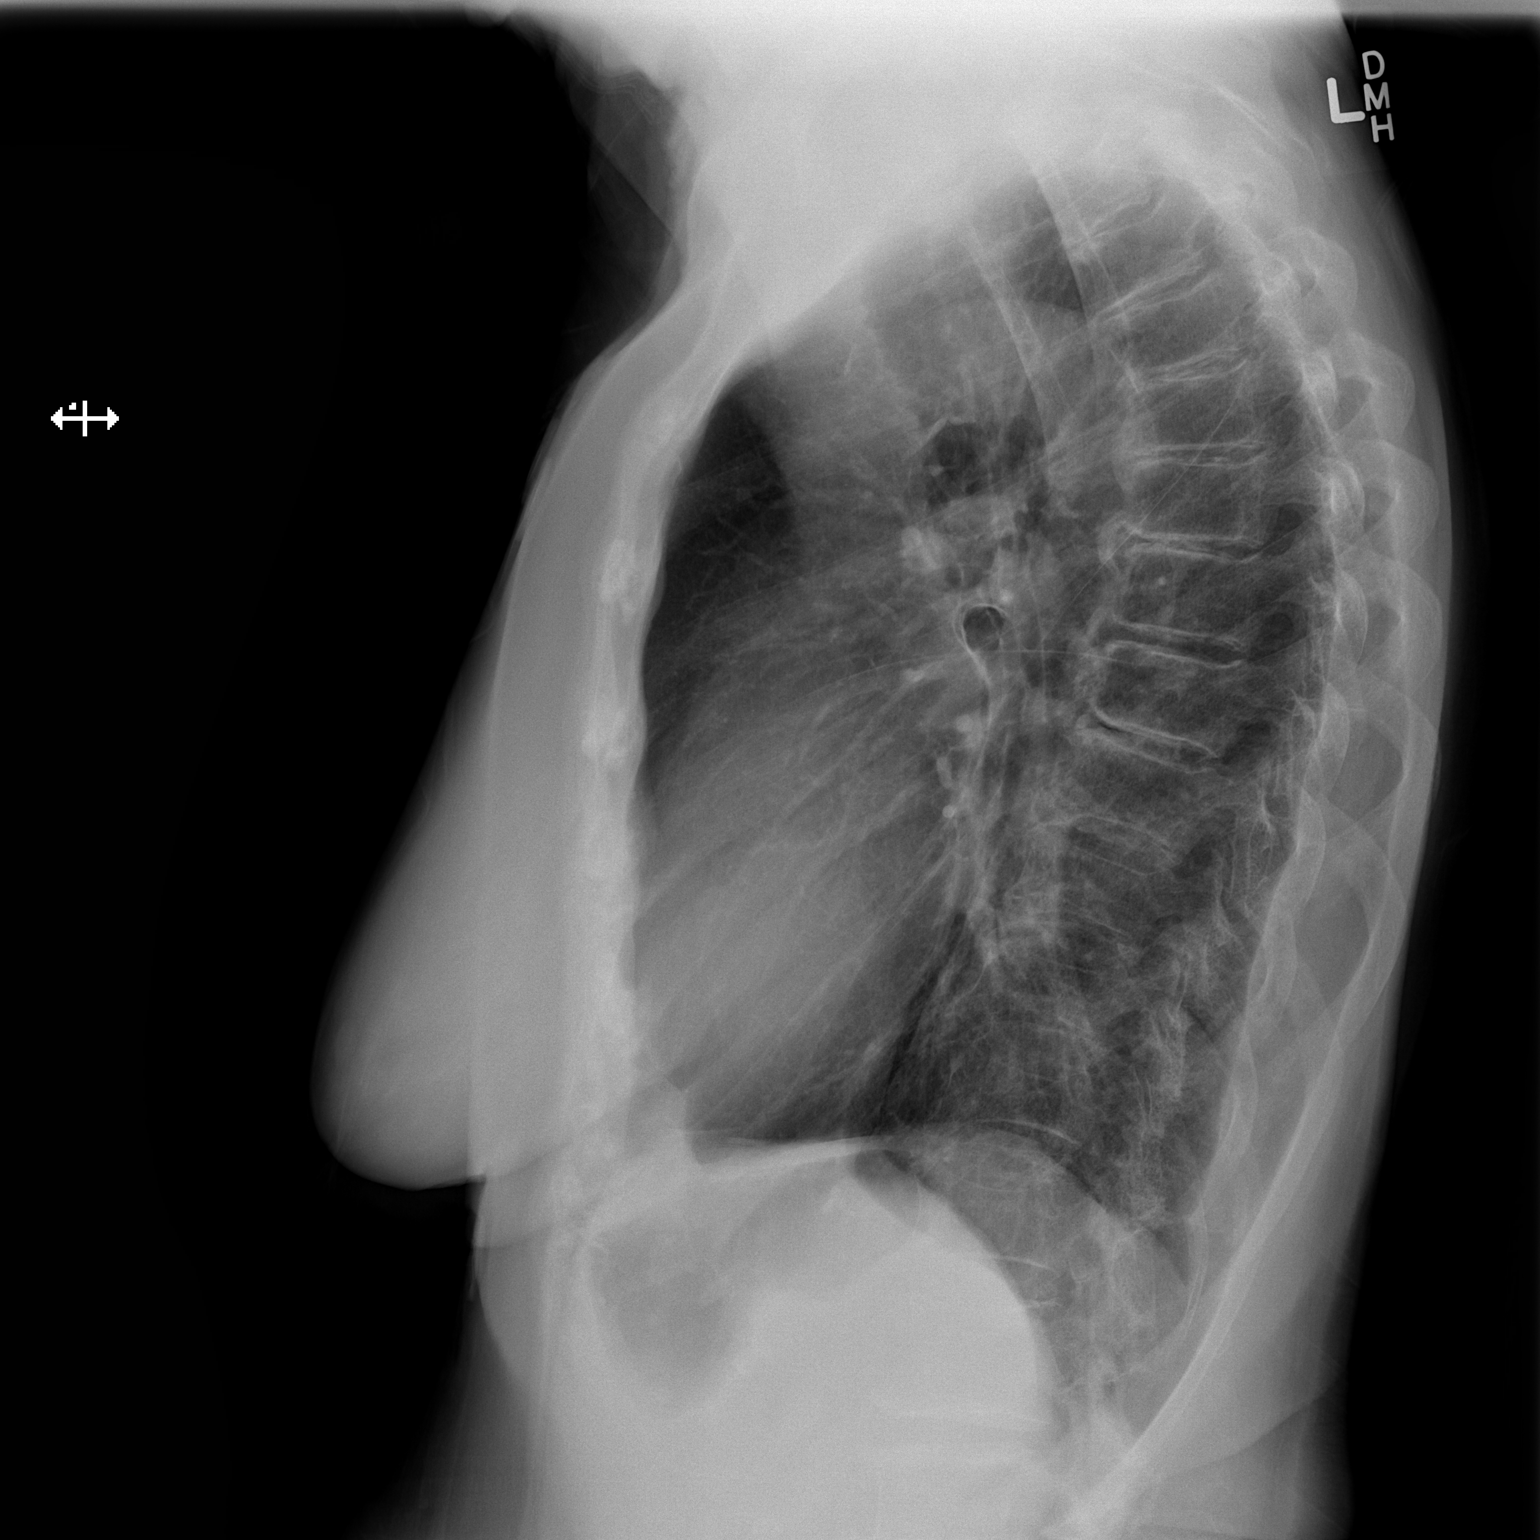

[2 of 2 positions shown; findings below may reference images not displayed]

FINDINGS: Pulmonary hyperinflation. Normal size heart. Slight uncoiling of the
thoracic aorta without aneurysm. S-shaped scoliosis of the
thoracolumbar spine.
IMPRESSION: No active cardiopulmonary disease. Mild pulmonary hyperinflation.
S-shaped scoliosis of the thoracolumbar spine.

## 2019-01-20 ENCOUNTER — Other Ambulatory Visit: Payer: Self-pay | Admitting: Nurse Practitioner

## 2019-01-21 NOTE — Telephone Encounter (Signed)
High risk warning populated when attempting to refill medication  Please review and approval if appropriate   Thanks,  S.Chrae B/CMA   

## 2019-02-28 ENCOUNTER — Encounter: Payer: Self-pay | Admitting: Internal Medicine

## 2019-03-16 ENCOUNTER — Other Ambulatory Visit: Payer: Self-pay

## 2019-03-16 ENCOUNTER — Other Ambulatory Visit: Payer: Medicare Other

## 2019-03-16 DIAGNOSIS — I10 Essential (primary) hypertension: Secondary | ICD-10-CM | POA: Diagnosis not present

## 2019-03-16 LAB — COMPLETE METABOLIC PANEL WITH GFR
AG Ratio: 2 (calc) (ref 1.0–2.5)
ALT: 13 U/L (ref 6–29)
AST: 21 U/L (ref 10–35)
Albumin: 4.3 g/dL (ref 3.6–5.1)
Alkaline phosphatase (APISO): 88 U/L (ref 37–153)
BUN/Creatinine Ratio: 24 (calc) — ABNORMAL HIGH (ref 6–22)
BUN: 24 mg/dL (ref 7–25)
CO2: 33 mmol/L — ABNORMAL HIGH (ref 20–32)
Calcium: 9.8 mg/dL (ref 8.6–10.4)
Chloride: 105 mmol/L (ref 98–110)
Creat: 1.02 mg/dL — ABNORMAL HIGH (ref 0.60–0.88)
GFR, Est African American: 56 mL/min/{1.73_m2} — ABNORMAL LOW (ref >=60)
GFR, Est Non African American: 49 mL/min/{1.73_m2} — ABNORMAL LOW (ref >=60)
Globulin: 2.2 g/dL (calc) (ref 1.9–3.7)
Glucose, Bld: 103 mg/dL — ABNORMAL HIGH (ref 65–99)
Potassium: 4.1 mmol/L (ref 3.5–5.3)
Sodium: 144 mmol/L (ref 135–146)
Total Bilirubin: 0.4 mg/dL (ref 0.2–1.2)
Total Protein: 6.5 g/dL (ref 6.1–8.1)

## 2019-03-18 ENCOUNTER — Other Ambulatory Visit: Payer: Self-pay

## 2019-03-18 ENCOUNTER — Other Ambulatory Visit: Payer: Self-pay | Admitting: Nurse Practitioner

## 2019-03-18 ENCOUNTER — Non-Acute Institutional Stay: Payer: Medicare Other | Admitting: Nurse Practitioner

## 2019-03-18 ENCOUNTER — Encounter: Payer: Self-pay | Admitting: Nurse Practitioner

## 2019-03-18 DIAGNOSIS — N183 Chronic kidney disease, stage 3 unspecified: Secondary | ICD-10-CM | POA: Insufficient documentation

## 2019-03-18 DIAGNOSIS — F419 Anxiety disorder, unspecified: Secondary | ICD-10-CM | POA: Diagnosis not present

## 2019-03-18 DIAGNOSIS — H02006 Unspecified entropion of left eye, unspecified eyelid: Secondary | ICD-10-CM | POA: Diagnosis not present

## 2019-03-18 DIAGNOSIS — I130 Hypertensive heart and chronic kidney disease with heart failure and stage 1 through stage 4 chronic kidney disease, or unspecified chronic kidney disease: Secondary | ICD-10-CM

## 2019-03-18 DIAGNOSIS — I10 Essential (primary) hypertension: Secondary | ICD-10-CM

## 2019-03-18 HISTORY — DX: Unspecified entropion of left eye, unspecified eyelid: H02.006

## 2019-03-18 NOTE — Progress Notes (Signed)
Location:   Clinic [facility]   Place of Service:  Clinic (12)clinic [facility] Provider: Marlana Latus NP  Code Status: DNR Goals of Care: [place] 03/18/2019  Does Patient Have a Medical Advance Directive? No  Type of Advance Directive -  Does patient want to make changes to medical advance directive? -  Copy of [place] in Chart? -  Would patient like information on creating a medical advance directive? No - Patient declined  Pre-existing out of facility DNR order (yellow form or pink MOST form) -     Chief Complaint  Patient presents with  . Medical Management of Chronic Issues    4 month follow up, discuss lab results     HPI: Patient is a 85 y.o. female seen today for medical management of chronic diseases.    The patient has history of depression, her mood is stable, on Mirtazapine 35m qd. HTN, blood pressure is controlled on Losartan 263mqd, Furosemide 2040md, on ASA 23m36mimvastatin 10mg73mfor cardiovascular risk reduction    Past Medical History:  Diagnosis Date  . Anemia   . Anxiety   . Apnea 05/2000   sleep study revealed mild upper airway obstruction & apnea during speel, no set recommendations for therapy  . Asthma   . Diverticulosis   . Dyspnea 12/15/09   spirometer normal & function actually worsens with nebk may have more of a "exercise-induced" type of bronchospasm   . Fatigue 07/2015  . GERD (gastroesophageal reflux disease)   . Glaucoma    both eyes  . Hyperlipidemia   . Hypertension   . Laryngeal paresis 09/12/2016  . Loss of weight 06/20/2016  . OSA (obstructive sleep apnea)   . Osteoporosis, senile   . Vocal cord dysfunction    per ENT, pt NOT to have elective intubation without disscussing with him    Past Surgical History:  Procedure Laterality Date  . CATARACT EXTRACTION W/ INTRAOCULAR LENS  IMPLANT, BILATERAL  2011   Dr. Epes Charise KillianERVICAL CONIZATION W/BX     due to cervical dysplasia  . COLONOSCOPY  02/2002    Diverticublosis   . EYE SURGERY Right 02/2016   laser surgery for scar tissure  . nasolaryngoscopy  2001   reflux laryngitis  . SKIN CANCER EXCISION     ?BCC  . TONSILLECTOMY    . UPPER GI ENDOSCOPY  05/2000   upper airway edema/changes that were consistent with GERD  . vocal fold cordotomy Left 2003   [facility]  Allergen Reactions  . Brimonidine Other (See Comments)  . Dorzolamide Hcl Itching    EYE PAIN    Allergies as of 03/18/2019      Reactions   Brimonidine Other (See Comments)   Dorzolamide Hcl Itching   EYE PAIN      Medication List       Accurate as of Mar 18, 2019  1:37 PM. If you have any questions, ask your nurse or doctor.        aspirin EC 81 MG tablet Take 81 mg by mouth daily.   brinzolamide 1 % ophthalmic suspension Commonly known as:  AZOPT Place 1 drop into both eyes 3 (three) times daily.   Calcium 600+D3 600-800 MG-UNIT Tabs Generic drug:  Calcium Carb-Cholecalciferol Take 1 tablet by mouth 2 (two) times daily.   CoQ10 200 MG Caps Take 1 capsule by mouth daily.   Fish Oil 1000 MG  Caps Take 1 capsule by mouth daily.   furosemide 20 MG tablet Commonly known as:  LASIX TAKE 1/2 TABLET BY MOUTH EVERY DAY   ketorolac 0.5 % ophthalmic solution Commonly known as:  ACULAR Place 1 drop into the left eye 4 (four) times daily.   losartan 25 MG tablet Commonly known as:  COZAAR Take two tablets (57m) by mouth once daily for blood pressure   loteprednol 0.2 % Susp Commonly known as:  LOTEMAX Place 1 drop into the left eye 4 (four) times daily. Left eye   mirtazapine 30 MG tablet Commonly known as:  REMERON TAKE 1 TABLET BY MOUTH AT BEDTIME   Rhopressa 0.02 % Soln Generic drug:  Netarsudil Dimesylate Apply 1 drop to eye at bedtime. Left eye   simvastatin 10 MG tablet Commonly known as:  ZOCOR Take one tablet by mouth at bedtime to control cholesterol   Timolol Maleate 0.5 % (DAILY) Soln Place 1 drop  into both eyes 2 (two) times daily.   vitamin C 500 MG tablet Commonly known as:  ASCORBIC ACID Take 500 mg by mouth daily.   Vitamin D (Cholecalciferol) 25 MCG (1000 UT) Caps Take 1 capsule by mouth 2 (two) times daily.   Vyzulta 0.024 % Soln Generic drug:  Latanoprostene Bunod Place 1 drop into both eyes at bedtime.       Review of Systems:  Review of Systems  Constitutional: Negative for activity change, appetite change, chills, diaphoresis, fatigue, fever and unexpected weight change.  HENT: Positive for hearing loss and voice change. Negative for congestion.   Respiratory: Negative for cough, shortness of breath and wheezing.   Cardiovascular: Negative for chest pain, palpitations and leg swelling.  Gastrointestinal: Negative for abdominal distention, abdominal pain, constipation, diarrhea, nausea and vomiting.  Genitourinary: Negative for difficulty urinating, dysuria and urgency.  Musculoskeletal: Positive for arthralgias and gait problem.  Skin: Negative for color change and pallor.  Neurological: Negative for dizziness, speech difficulty, weakness and headaches.       Memory lapses.   Psychiatric/Behavioral: Negative for agitation, behavioral problems, hallucinations and sleep disturbance. The patient is not nervous/anxious.     Health Maintenance  Topic Date Due  . INFLUENZA VACCINE  06/05/2019  . TETANUS/TDAP  08/07/2025  . DEXA SCAN  Completed  . PNA vac Low Risk Adult  Completed    Physical Exam: Vitals:   03/18/19 1302  BP: 122/60  Pulse: 93  Temp: 99.3 F (37.4 C)  TempSrc: Oral  SpO2: 100%  Weight: 129 lb 9.6 oz (58.8 kg)  Height: _0  (1.626 m)   Body mass index is 22.25 kg/m. Physical Exam Vitals signs reviewed.  Constitutional:      General: She is not in acute distress.    Appearance: Normal appearance. She is normal weight. She is not ill-appearing, toxic-appearing or diaphoretic.  HENT:     Head: Normocephalic and atraumatic.      Nose: Nose normal.     Mouth/Throat:     Mouth: Mucous membranes are moist.  Eyes:     Extraocular Movements: Extraocular movements intact.     Conjunctiva/sclera: Conjunctivae normal.     Pupils: Pupils are equal, round, and reactive to light.     Comments: Left lower eyelid entropion, mild irritation/injected left lower conjunctiva.   Neck:     Musculoskeletal: Normal range of motion and neck supple.  Cardiovascular:     Rate and Rhythm: Normal rate and regular rhythm.     Heart sounds: No  murmur.  Pulmonary:     Effort: Pulmonary effort is normal.     Breath sounds: No wheezing, rhonchi or rales.  Abdominal:     General: There is no distension.     Palpations: Abdomen is soft.     Tenderness: There is no abdominal tenderness. There is no right CVA tenderness, left CVA tenderness or guarding.  Musculoskeletal:     Right lower leg: No edema.     Left lower leg: No edema.  Skin:    General: Skin is warm and dry.  Neurological:     General: No focal deficit present.     Mental Status: She is alert. Mental status is at baseline.     Cranial Nerves: No cranial nerve deficit.     Motor: No weakness.     Coordination: Coordination normal.     Gait: Gait abnormal.     Comments: Oriented to person and place.   Psychiatric:        Mood and Affect: Mood normal.        Behavior: Behavior normal.        Thought Content: Thought content normal.        Judgment: Judgment normal.     Labs reviewed: Basic Metabolic Panel: Recent Labs    04/20/18 1601 10/08/18 1843 11/16/18 0000 03/16/19 0715  NA 147* 141 143 144  K 4.4 4.2 4.2 4.1  CL 108 103 103 105  CO2 24 29 32 33*  GLUCOSE 96 111* 99 103*  BUN _0 CREATININE 0.89* 0.98 1.04* 1.02*  CALCIUM 9.4 9.4 9.5 9.8  TSH 2.17  --   --   --    Liver Function Tests: Recent Labs    04/20/18 1601 03/16/19 0715  AST 17 21  ALT 8 13  BILITOT 0.3 0.4  PROT 6.5 6.5   No results for input(s): LIPASE, AMYLASE in the last  8760 hours. No results for input(s): AMMONIA in the last 8760 hours. CBC: Recent Labs    10/08/18 1843  WBC 5.1  HGB 13.0  HCT 42.0  MCV 95.7  PLT 187   Lipid Panel: Recent Labs    04/20/18 1601  CHOL 174  HDL 60  LDLCALC 94  TRIG 104  CHOLHDL 2.9   No results found for: HGBA1C  Procedures since last visit: No results found.  Assessment/Plan  HTN (hypertension) Blood pressure is controlled, continue Losartan 68m qd, Furosemide 27mqd, 03/15/19 Na 144, K 4.1, Bun 24, creat 1.02.   Anxiety Her mood is stable, continue Mirtazapine 3037md.   Benign hypertensive heart and CKD, stage 3 (GFR 30-59), w CHF (HCCClarks/12/20 eGFR 44, Furosemide may be contributory. Observe.   Entropion eyelid, left Left lower eyelid, pending surgery.    Labs/tests ordered: none  Next appt:  4 months with Dr. GupLyndel Safe

## 2019-03-18 NOTE — Assessment & Plan Note (Signed)
Blood pressure is controlled, continue Losartan 25mg  qd, Furosemide 20mg  qd, 03/15/19 Na 144, K 4.1, Bun 24, creat 1.02.

## 2019-03-18 NOTE — Patient Instructions (Signed)
Next appt:  4 months with Dr. Lyndel Safe

## 2019-03-18 NOTE — Assessment & Plan Note (Signed)
Left lower eyelid, pending surgery.

## 2019-03-18 NOTE — Assessment & Plan Note (Signed)
03/16/19 eGFR 44, Furosemide may be contributory. Observe.

## 2019-03-18 NOTE — Assessment & Plan Note (Signed)
Her mood is stable, continue Mirtazapine 30mg  qd.

## 2019-03-26 ENCOUNTER — Other Ambulatory Visit: Payer: Self-pay

## 2019-03-26 DIAGNOSIS — E785 Hyperlipidemia, unspecified: Secondary | ICD-10-CM

## 2019-03-26 MED ORDER — SIMVASTATIN 10 MG PO TABS: ORAL_TABLET | ORAL | 0 refills | Status: DC

## 2019-03-31 DIAGNOSIS — H401124 Primary open-angle glaucoma, left eye, indeterminate stage: Secondary | ICD-10-CM | POA: Diagnosis not present

## 2019-03-31 DIAGNOSIS — H02045 Spastic entropion of left lower eyelid: Secondary | ICD-10-CM | POA: Diagnosis not present

## 2019-03-31 DIAGNOSIS — H35352 Cystoid macular degeneration, left eye: Secondary | ICD-10-CM | POA: Diagnosis not present

## 2019-03-31 DIAGNOSIS — H16012 Central corneal ulcer, left eye: Secondary | ICD-10-CM | POA: Diagnosis not present

## 2019-04-22 DIAGNOSIS — H401113 Primary open-angle glaucoma, right eye, severe stage: Secondary | ICD-10-CM | POA: Diagnosis not present

## 2019-04-22 DIAGNOSIS — H401122 Primary open-angle glaucoma, left eye, moderate stage: Secondary | ICD-10-CM | POA: Diagnosis not present

## 2019-04-22 DIAGNOSIS — H16002 Unspecified corneal ulcer, left eye: Secondary | ICD-10-CM | POA: Diagnosis not present

## 2019-05-24 DIAGNOSIS — H35352 Cystoid macular degeneration, left eye: Secondary | ICD-10-CM | POA: Diagnosis not present

## 2019-05-26 DIAGNOSIS — J3802 Paralysis of vocal cords and larynx, bilateral: Secondary | ICD-10-CM | POA: Diagnosis not present

## 2019-05-26 DIAGNOSIS — J384 Edema of larynx: Secondary | ICD-10-CM | POA: Diagnosis not present

## 2019-05-26 DIAGNOSIS — J383 Other diseases of vocal cords: Secondary | ICD-10-CM | POA: Diagnosis not present

## 2019-05-28 ENCOUNTER — Other Ambulatory Visit: Payer: Self-pay | Admitting: *Deleted

## 2019-05-28 MED ORDER — LOSARTAN POTASSIUM 25 MG PO TABS: ORAL_TABLET | ORAL | 1 refills | Status: DC

## 2019-05-28 NOTE — Telephone Encounter (Signed)
Daughter requested refill.  

## 2019-06-02 DIAGNOSIS — H401122 Primary open-angle glaucoma, left eye, moderate stage: Secondary | ICD-10-CM | POA: Diagnosis not present

## 2019-06-02 DIAGNOSIS — Z961 Presence of intraocular lens: Secondary | ICD-10-CM | POA: Diagnosis not present

## 2019-06-02 DIAGNOSIS — H401113 Primary open-angle glaucoma, right eye, severe stage: Secondary | ICD-10-CM | POA: Diagnosis not present

## 2019-06-11 ENCOUNTER — Telehealth: Payer: Self-pay | Admitting: Internal Medicine

## 2019-06-11 NOTE — Telephone Encounter (Signed)
Call placed to patient daughter [name] her husband stated she was not available and to try back in 1 hour.

## 2019-06-11 NOTE — Telephone Encounter (Signed)
Patient's daughter called and requested to make an appointment for her mother to see Dr. Lyndel Safe at [facility].  She states that patient is a resident at [facility] but is now staying with her.  Daughter stated that she was advised by [facility] to contact [facility] for appointment.  She was offered an appointment her with other provider at [facility].  Daughter thought Dr. Lyndel Safe could see her at [facility].  She is also thinking that her mother can come to [facility] to see Dr. Lyndel Safe.  Please call daughter regarding an appointment and health concerns.

## 2019-06-11 NOTE — Telephone Encounter (Signed)
Called Angela [name] again and her husband has her phone and is waiting on the vet to call as Angela [name] is taking a nap.I  will try the patient again on Monday 06/14/2019

## 2019-06-11 NOTE — Telephone Encounter (Signed)
Spoke with daughter and advised results, appt scheduled.

## 2019-06-25 ENCOUNTER — Other Ambulatory Visit: Payer: Self-pay

## 2019-06-25 ENCOUNTER — Encounter: Payer: Self-pay | Admitting: Internal Medicine

## 2019-06-25 ENCOUNTER — Non-Acute Institutional Stay: Payer: Medicare Other | Admitting: Internal Medicine

## 2019-06-25 ENCOUNTER — Other Ambulatory Visit: Payer: Self-pay | Admitting: Internal Medicine

## 2019-06-25 VITALS — BP 140/92 | HR 75 | Temp 96.9°F | Resp 18 | Ht 64.0 in | Wt 126.2 lb

## 2019-06-25 DIAGNOSIS — Z8709 Personal history of other diseases of the respiratory system: Secondary | ICD-10-CM | POA: Diagnosis not present

## 2019-06-25 DIAGNOSIS — I130 Hypertensive heart and chronic kidney disease with heart failure and stage 1 through stage 4 chronic kidney disease, or unspecified chronic kidney disease: Secondary | ICD-10-CM | POA: Diagnosis not present

## 2019-06-25 DIAGNOSIS — F419 Anxiety disorder, unspecified: Secondary | ICD-10-CM | POA: Diagnosis not present

## 2019-06-25 DIAGNOSIS — E785 Hyperlipidemia, unspecified: Secondary | ICD-10-CM

## 2019-06-25 DIAGNOSIS — M810 Age-related osteoporosis without current pathological fracture: Secondary | ICD-10-CM

## 2019-06-25 DIAGNOSIS — R7303 Prediabetes: Secondary | ICD-10-CM | POA: Diagnosis not present

## 2019-06-25 DIAGNOSIS — I10 Essential (primary) hypertension: Secondary | ICD-10-CM

## 2019-06-25 DIAGNOSIS — N183 Chronic kidney disease, stage 3 unspecified: Secondary | ICD-10-CM

## 2019-06-25 NOTE — Progress Notes (Signed)
Location:      Place of Service:     Provider:   Code Status:  Goals of Care:  Advanced Directives 03/18/2019  Does Patient Have a Medical Advance Directive? No  Type of Advance Directive -  Does patient want to make changes to medical advance directive? -  Copy of [place] in Chart? -  Would patient like information on creating a medical advance directive? No - Patient declined  Pre-existing out of facility DNR order (yellow form or pink MOST form) -     Chief Complaint  Patient presents with  . Medical Management of Chronic Issues    4 mo f/u    HPI: Patient is a 85 y.o. female seen today for medical management of chronic diseases.   Patient has  History of bilateral vocal fold paralysis status post left posterior cordotomy in 2003 Follows closely with ENT Recent Corneal Ulcer of Left Eye Also has h/o Bilateral Primary angle Glaucoma Recently underwent eye surgery and is on close follow-up with her ophthalmologist  Hypertension Controlled on losartan Bilateral lower extremity edema On very low-dose of furosemide Hyperlipidemia Stable on Zocor History of depression and weight loss Doing better on Remeron  Patient did not have any acute complaints today.   Her daughter took her home from her apartment in [facility] due to her eye surgery and  eye treatment which patient could not do by herself Per daughter patient is eating better though always have had poor appetite. She is independent in her ADLs and has not had any falls recently Does not use cane or walker   Past Medical History:  Diagnosis Date  . Anemia   . Anxiety   . Apnea 05/2000   sleep study revealed mild upper airway obstruction & apnea during speel, no set recommendations for therapy  . Asthma   . Diverticulosis   . Dyspnea 12/15/09   spirometer normal & function actually worsens with nebk may have more of a "exercise-induced" type of bronchospasm   . Fatigue 07/2015  . GERD  (gastroesophageal reflux disease)   . Glaucoma    both eyes  . Hyperlipidemia   . Hypertension   . Laryngeal paresis 09/12/2016  . Loss of weight 06/20/2016  . OSA (obstructive sleep apnea)   . Osteoporosis, senile   . Vocal cord dysfunction    per ENT, pt NOT to have elective intubation without disscussing with him    Past Surgical History:  Procedure Laterality Date  . CATARACT EXTRACTION W/ INTRAOCULAR LENS  IMPLANT, BILATERAL  2011   Dr. Charise Killian  . CERVICAL CONIZATION W/BX     due to cervical dysplasia  . COLONOSCOPY  02/2002   Diverticublosis   . EYE SURGERY Right 02/2016   laser surgery for scar tissure  . nasolaryngoscopy  2001   reflux laryngitis  . SKIN CANCER EXCISION     ?BCC  . TONSILLECTOMY    . UPPER GI ENDOSCOPY  05/2000   upper airway edema/changes that were consistent with GERD  . vocal fold cordotomy Left 2003   Dr. Denyse Dago [facility]    Allergies  Allergen Reactions  . Brimonidine Other (See Comments)  . Dorzolamide Hcl Itching    EYE PAIN    Outpatient Encounter Medications as of 06/25/2019  Medication Sig  . aspirin EC 81 MG tablet Take 81 mg by mouth daily.  . brinzolamide (AZOPT) 1 % ophthalmic suspension Place 1 drop into both eyes 3 (three) times daily.   Marland Kitchen  Calcium-Vitamin D-Vitamin K (CALCIUM SOFT CHEWS PO) Take by mouth.  . Coenzyme Q10 (COQ10) 200 MG CAPS Take 1 capsule by mouth daily.   . furosemide (LASIX) 20 MG tablet TAKE 1/2 TABLET BY MOUTH EVERY DAY  . ketorolac (ACULAR) 0.5 % ophthalmic solution Place 1 drop into the left eye 4 (four) times daily.   Marland Kitchen losartan (COZAAR) 25 MG tablet Take two tablets (50mg ) by mouth once daily for blood pressure  . loteprednol (LOTEMAX) 0.2 % SUSP Place 1 drop into the left eye 6 (six) times daily. Left eye   . mirtazapine (REMERON) 30 MG tablet TAKE 1 TABLET BY MOUTH AT BEDTIME  . Netarsudil Dimesylate (RHOPRESSA) 0.02 % SOLN Apply 1 drop to eye at bedtime. Both eyes  . Omega-3 Fatty Acids (FISH OIL)  1000 MG CAPS Take 1 capsule by mouth daily.   . simvastatin (ZOCOR) 10 MG tablet Take one tablet by mouth at bedtime to control cholesterol  . Timolol Maleate 0.5 % (DAILY) SOLN Place 1 drop into both eyes 2 (two) times daily.   . vitamin C (ASCORBIC ACID) 500 MG tablet Take 500 mg by mouth daily.  . Vitamin D, Cholecalciferol, 1000 UNITS CAPS Take 1 capsule by mouth 2 (two) times daily.   Marland Kitchen VYZULTA 0.024 % SOLN Place 1 drop into both eyes at bedtime.   . [DISCONTINUED] Calcium Carb-Cholecalciferol (CALCIUM 600+D3) 600-800 MG-UNIT TABS Take 1 tablet by mouth 2 (two) times daily.    No facility-administered encounter medications on file as of 06/25/2019.     Review of Systems:  Review of Systems  Constitutional: Negative.   HENT: Negative.   Respiratory: Negative.   Cardiovascular: Positive for leg swelling.  Gastrointestinal: Negative.   Genitourinary: Negative.   Musculoskeletal: Negative.   Skin: Negative.   Neurological: Positive for weakness.  Psychiatric/Behavioral: Negative.   All other systems reviewed and are negative.   Health Maintenance  Topic Date Due  . INFLUENZA VACCINE  06/05/2019  . TETANUS/TDAP  08/07/2025  . DEXA SCAN  Completed  . PNA vac Low Risk Adult  Completed    Physical Exam: Vitals:   06/25/19 1144  BP: (!) 140/92  Pulse: 75  Resp: 18  Temp: (!) 96.9 F (36.1 C)  SpO2: 96%  Weight: 126 lb 3.2 oz (57.2 kg)  Height: 5\' 4"  (1.626 m)   Body mass index is 21.66 kg/m. Physical Exam Vitals signs reviewed.  Constitutional:      Appearance: Normal appearance.  HENT:     Head: Normocephalic.     Nose: Nose normal.     Mouth/Throat:     Mouth: Mucous membranes are moist.     Pharynx: Oropharynx is clear.  Eyes:     Pupils: Pupils are equal, round, and reactive to light.  Neck:     Musculoskeletal: Neck supple.  Cardiovascular:     Rate and Rhythm: Normal rate and regular rhythm.     Pulses: Normal pulses.  Pulmonary:     Effort:  Pulmonary effort is normal.     Breath sounds: Normal breath sounds.  Abdominal:     General: Abdomen is flat. Bowel sounds are normal.     Palpations: Abdomen is soft.  Musculoskeletal:        General: No swelling.  Skin:    General: Skin is warm and dry.  Neurological:     General: No focal deficit present.     Mental Status: She is alert and oriented to person, place, and time.  Comments: Has mild difficulty in getting up from Exam table  Psychiatric:        Mood and Affect: Mood normal.        Thought Content: Thought content normal.        Judgment: Judgment normal.     Labs reviewed: Basic Metabolic Panel: Recent Labs    10/08/18 1843 11/16/18 0000 03/16/19 0715  NA 141 143 144  K 4.2 4.2 4.1  CL 103 103 105  CO2 29 32 33*  GLUCOSE 111* 99 103*  BUN 21 22 24   CREATININE 0.98 1.04* 1.02*  CALCIUM 9.4 9.5 9.8   Liver Function Tests: Recent Labs    03/16/19 0715  AST 21  ALT 13  BILITOT 0.4  PROT 6.5   No results for input(s): LIPASE, AMYLASE in the last 8760 hours. No results for input(s): AMMONIA in the last 8760 hours. CBC: Recent Labs    10/08/18 1843  WBC 5.1  HGB 13.0  HCT 42.0  MCV 95.7  PLT 187   Lipid Panel: No results for input(s): CHOL, HDL, LDLCALC, TRIG, CHOLHDL, LDLDIRECT in the last 8760 hours. No results found for: HGBA1C  Procedures since last visit: No results found.  Assessment/Plan Essential hypertension Blood pressure controlled on Cozaar and low-dose Lasix  Benign hypertensive heart and CKD, stage 3 (GFR 30-59), w CHF (HCC) Creatinine has been stable Due for repeat CMP  Anxiety Patient doing well with her daughter At this time I would not recommend to start her on any other medicines I discussed this with her daughter Continue her on Remeron  Osteoporosis, postmenopausal Patient have osteoporosis On calcium and vitamin D Has refused treatment before We will rediscuss at next visit  Hyperlipidemia,  unspecified hyperlipidemia type On Zocor Repeat fasting lipid  H/O vocal cord paralysis Follows very closely with ENT Left corneal ulcer and bilateral glaucoma Patient is on a number of eyedrops.  Her daughter is monitoring the treatment Mild proximal weakness Discussed with the patient to restart her daily walking which she had not been doing recently due to her eye surgery.  Will consider therapy if patient continues to get weak  Labs/tests ordered:  Next appt:  07/07/2019    Total time spent in this patient care encounter was  45_  minutes; greater than 50% of the visit spent counseling patient and staff, reviewing records , Labs and coordinating care for problems addressed at this encounter.

## 2019-07-07 ENCOUNTER — Other Ambulatory Visit: Payer: Self-pay

## 2019-07-07 ENCOUNTER — Other Ambulatory Visit: Payer: Medicare Other

## 2019-07-07 DIAGNOSIS — R7303 Prediabetes: Secondary | ICD-10-CM | POA: Diagnosis not present

## 2019-07-07 DIAGNOSIS — M810 Age-related osteoporosis without current pathological fracture: Secondary | ICD-10-CM

## 2019-07-07 DIAGNOSIS — E785 Hyperlipidemia, unspecified: Secondary | ICD-10-CM

## 2019-07-07 DIAGNOSIS — I10 Essential (primary) hypertension: Secondary | ICD-10-CM | POA: Diagnosis not present

## 2019-07-08 LAB — CBC WITH DIFFERENTIAL/PLATELET
Absolute Monocytes: 414 cells/uL (ref 200–950)
Basophils Absolute: 29 cells/uL (ref 0–200)
Basophils Relative: 0.7 %
Eosinophils Absolute: 82 cells/uL (ref 15–500)
Eosinophils Relative: 2 %
HCT: 40.2 % (ref 35.0–45.0)
Hemoglobin: 13 g/dL (ref 11.7–15.5)
Lymphs Abs: 955 cells/uL (ref 850–3900)
MCH: 31.3 pg (ref 27.0–33.0)
MCHC: 32.3 g/dL (ref 32.0–36.0)
MCV: 96.9 fL (ref 80.0–100.0)
MPV: 11.8 fL (ref 7.5–12.5)
Monocytes Relative: 10.1 %
Neutro Abs: 2620 cells/uL (ref 1500–7800)
Neutrophils Relative %: 63.9 %
Platelets: 183 10*3/uL (ref 140–400)
RBC: 4.15 10*6/uL (ref 3.80–5.10)
RDW: 12.6 % (ref 11.0–15.0)
Total Lymphocyte: 23.3 %
WBC: 4.1 10*3/uL (ref 3.8–10.8)

## 2019-07-08 LAB — LIPID PANEL
Cholesterol: 177 mg/dL (ref <200)
HDL: 59 mg/dL (ref >=50)
LDL Cholesterol (Calc): 93 mg/dL (calc)
Non-HDL Cholesterol (Calc): 118 mg/dL (calc) (ref <130)
Total CHOL/HDL Ratio: 3 (calc) (ref <5.0)
Triglycerides: 155 mg/dL — ABNORMAL HIGH (ref <150)

## 2019-07-08 LAB — COMPLETE METABOLIC PANEL WITH GFR
AG Ratio: 2 (calc) (ref 1.0–2.5)
ALT: 11 U/L (ref 6–29)
AST: 22 U/L (ref 10–35)
Albumin: 4.4 g/dL (ref 3.6–5.1)
Alkaline phosphatase (APISO): 85 U/L (ref 37–153)
BUN/Creatinine Ratio: 24 (calc) — ABNORMAL HIGH (ref 6–22)
BUN: 23 mg/dL (ref 7–25)
CO2: 35 mmol/L — ABNORMAL HIGH (ref 20–32)
Calcium: 9.6 mg/dL (ref 8.6–10.4)
Chloride: 103 mmol/L (ref 98–110)
Creat: 0.97 mg/dL — ABNORMAL HIGH (ref 0.60–0.88)
GFR, Est African American: 60 mL/min/{1.73_m2} (ref >=60)
GFR, Est Non African American: 52 mL/min/{1.73_m2} — ABNORMAL LOW (ref >=60)
Globulin: 2.2 g/dL (calc) (ref 1.9–3.7)
Glucose, Bld: 98 mg/dL (ref 65–99)
Potassium: 4.4 mmol/L (ref 3.5–5.3)
Sodium: 142 mmol/L (ref 135–146)
Total Bilirubin: 0.5 mg/dL (ref 0.2–1.2)
Total Protein: 6.6 g/dL (ref 6.1–8.1)

## 2019-07-08 LAB — VITAMIN D 25 HYDROXY (VIT D DEFICIENCY, FRACTURES): Vit D, 25-Hydroxy: 46 ng/mL (ref 30–100)

## 2019-07-08 LAB — TSH: TSH: 1.29 mIU/L (ref 0.40–4.50)

## 2019-07-08 LAB — HEMOGLOBIN A1C
Hgb A1c MFr Bld: 5.6 % of total Hgb (ref <5.7)
Mean Plasma Glucose: 114 (calc)
eAG (mmol/L): 6.3 (calc)

## 2019-07-14 DIAGNOSIS — H02045 Spastic entropion of left lower eyelid: Secondary | ICD-10-CM | POA: Diagnosis not present

## 2019-07-14 DIAGNOSIS — H401113 Primary open-angle glaucoma, right eye, severe stage: Secondary | ICD-10-CM | POA: Diagnosis not present

## 2019-07-14 DIAGNOSIS — H401122 Primary open-angle glaucoma, left eye, moderate stage: Secondary | ICD-10-CM | POA: Diagnosis not present

## 2019-07-14 DIAGNOSIS — Z961 Presence of intraocular lens: Secondary | ICD-10-CM | POA: Diagnosis not present

## 2019-07-23 ENCOUNTER — Encounter: Payer: Medicare Other | Admitting: Internal Medicine

## 2019-08-06 ENCOUNTER — Other Ambulatory Visit: Payer: Self-pay

## 2019-08-06 ENCOUNTER — Non-Acute Institutional Stay: Payer: Medicare Other | Admitting: Internal Medicine

## 2019-08-06 ENCOUNTER — Encounter: Payer: Self-pay | Admitting: Internal Medicine

## 2019-08-06 VITALS — BP 138/80 | HR 76 | Temp 96.4°F | Ht 64.0 in | Wt 126.2 lb

## 2019-08-06 DIAGNOSIS — M810 Age-related osteoporosis without current pathological fracture: Secondary | ICD-10-CM

## 2019-08-06 DIAGNOSIS — R2681 Unsteadiness on feet: Secondary | ICD-10-CM | POA: Diagnosis not present

## 2019-08-06 DIAGNOSIS — R7303 Prediabetes: Secondary | ICD-10-CM

## 2019-08-06 DIAGNOSIS — E785 Hyperlipidemia, unspecified: Secondary | ICD-10-CM

## 2019-08-06 DIAGNOSIS — I10 Essential (primary) hypertension: Secondary | ICD-10-CM | POA: Diagnosis not present

## 2019-08-06 NOTE — Progress Notes (Signed)
Location:  [place] of Service:  Clinic ([id number])  Provider:   Code Status:  Goals of Care:  Advanced Directives 08/06/2019  Does Patient Have a Medical Advance Directive? No;Yes  Type of Advance Directive -  Does patient want to make changes to medical advance directive? -  Copy of [place] in Chart? -  Would patient like information on creating a medical advance directive? -  Pre-existing out of facility DNR order (yellow form or pink MOST form) -     Chief Complaint  Patient presents with  . Medical Management of Chronic Issues    2 month follow to discuss labs and blood work. The 27th of May she had her eye surgery.  Marland Kitchen Health Maintenance    Patient's daughter will check with facility on flu shot.     HPI: Patient is a 85 y.o. female seen today for medical management of chronic diseases.     Patient has  History of bilateral vocal fold paralysis status post left posterior cordotomy in 2003 Follows closely with ENT  Recent Corneal Ulcer of Left Eye Also has h/o Bilateral Primary angle Glaucoma Recently underwent eye surgery and is on close follow-up with her ophthalmologist They have told her that she would not get Normal Eye sight due to scaring  Hypertension Patients daughter checks her BP twice a day. And she is getting Low BP in afternoon with systolic sometimes of [id number]. And it is 140 in the morning No Dizziness. Bilateral lower extremity edema On very low-dose of furosemide Hyperlipidemia Stable on Zocor History of depression and weight loss Doing better on Remeron Past Medical History:  Diagnosis Date  . Anemia   . Anxiety   . Apnea 05/2000   sleep study revealed mild upper airway obstruction & apnea during speel, no set recommendations for therapy  . Asthma   . Diverticulosis   . Dyspnea 12/15/09   spirometer normal & function actually worsens with nebk may have more of a "exercise-induced" type of bronchospasm   .  Fatigue 07/2015  . GERD (gastroesophageal reflux disease)   . Glaucoma    both eyes  . Hyperlipidemia   . Hypertension   . Laryngeal paresis 09/12/2016  . Loss of weight 06/20/2016  . OSA (obstructive sleep apnea)   . Osteoporosis, senile   . Vocal cord dysfunction    per ENT, pt NOT to have elective intubation without disscussing with him    Past Surgical History:  Procedure Laterality Date  . CATARACT EXTRACTION W/ INTRAOCULAR LENS  IMPLANT, BILATERAL  2011   Dr. Charise Killian  . CERVICAL CONIZATION W/BX     due to cervical dysplasia  . COLONOSCOPY  02/2002   Diverticublosis   . EYE SURGERY Right 02/2016   laser surgery for scar tissure  . nasolaryngoscopy  2001   reflux laryngitis  . SKIN CANCER EXCISION     ?BCC  . TONSILLECTOMY    . UPPER GI ENDOSCOPY  05/2000   upper airway edema/changes that were consistent with GERD  . vocal fold cordotomy Left 2003   Dr. Denyse Dago [facility]    Allergies  Allergen Reactions  . Brimonidine Other (See Comments)  . Dorzolamide Hcl Itching    EYE PAIN    Outpatient Encounter Medications as of 08/06/2019  Medication Sig  . aspirin EC 81 MG tablet Take 81 mg by mouth daily.  . brinzolamide (AZOPT) 1 % ophthalmic suspension Place 1 drop into both eyes  3 (three) times daily.   . Calcium-Vitamin D-Vitamin K (CALCIUM SOFT CHEWS PO) Take by mouth.  . Coenzyme Q10 (COQ10) 200 MG CAPS Take 1 capsule by mouth daily.   . furosemide (LASIX) 20 MG tablet TAKE 1/2 TABLET BY MOUTH EVERY DAY  . ketorolac (ACULAR) 0.5 % ophthalmic solution Place 1 drop into the left eye 4 (four) times daily.   Marland Kitchen losartan (COZAAR) 25 MG tablet Take two tablets (50mg ) by mouth once daily for blood pressure  . loteprednol (LOTEMAX) 0.2 % SUSP Place 1 drop into the left eye 6 (six) times daily. Left eye   . mirtazapine (REMERON) 30 MG tablet TAKE 1 TABLET BY MOUTH AT BEDTIME  . Netarsudil Dimesylate (RHOPRESSA) 0.02 % SOLN Apply 1 drop to eye at bedtime. Both eyes  . Omega-3  Fatty Acids (FISH OIL) 1000 MG CAPS Take 1 capsule by mouth daily.   . simvastatin (ZOCOR) 10 MG tablet TAKE ONE TABLET BY MOUTH AT BEDTIME TO CONTROL CHOLESTEROL  . Timolol Maleate 0.5 % (DAILY) SOLN Place 1 drop into both eyes 2 (two) times daily.   . vitamin C (ASCORBIC ACID) 500 MG tablet Take 500 mg by mouth daily.  . Vitamin D, Cholecalciferol, 1000 UNITS CAPS Take 1 capsule by mouth 2 (two) times daily.   Marland Kitchen VYZULTA 0.024 % SOLN Place 1 drop into both eyes at bedtime.    No facility-administered encounter medications on file as of 08/06/2019.     Review of Systems:  Review of Systems  Constitutional: Negative.   HENT: Negative.   Eyes: Positive for visual disturbance.  Respiratory: Negative.   Cardiovascular: Positive for leg swelling.  Gastrointestinal: Negative.   Genitourinary: Negative.   Musculoskeletal: Positive for gait problem.  Skin: Negative.   Psychiatric/Behavioral: Negative.     Health Maintenance  Topic Date Due  . INFLUENZA VACCINE  06/05/2019  . TETANUS/TDAP  08/07/2025  . DEXA SCAN  Completed  . PNA vac Low Risk Adult  Completed    Physical Exam: Vitals:   08/06/19 0918  BP: 138/80  Pulse: 76  Temp: (!) 96.4 F (35.8 C)  SpO2: 95%  Weight: 126 lb 3.2 oz (57.2 kg)  Height: 5\' 4"  (1.626 m)   Body mass index is 21.66 kg/m. Physical Exam Vitals signs reviewed.  Constitutional:      Appearance: Normal appearance.  HENT:     Head: Normocephalic.     Nose: Nose normal.     Mouth/Throat:     Mouth: Mucous membranes are moist.     Pharynx: Oropharynx is clear.  Eyes:     Pupils: Pupils are equal, round, and reactive to light.  Neck:     Musculoskeletal: Neck supple.  Cardiovascular:     Pulses: Normal pulses.     Heart sounds: Normal heart sounds.  Pulmonary:     Effort: Pulmonary effort is normal.     Breath sounds: Normal breath sounds.  Abdominal:     General: Abdomen is flat. Bowel sounds are normal.     Palpations: Abdomen is soft.   Musculoskeletal:        General: Swelling present.  Skin:    General: Skin is warm and dry.  Neurological:     General: No focal deficit present.     Mental Status: She is alert and oriented to person, place, and time.     Comments: Mildily Unstable Gait  Psychiatric:        Mood and Affect: Mood normal.  Thought Content: Thought content normal.        Judgment: Judgment normal.     Labs reviewed: Basic Metabolic Panel: Recent Labs    11/16/18 0000 03/16/19 0715 07/07/19 0944  NA 143 144 142  K 4.2 4.1 4.4  CL 103 105 103  CO2 32 33* 35*  GLUCOSE 99 103* 98  BUN 22 24 23   CREATININE 1.04* 1.02* 0.97*  CALCIUM 9.5 9.8 9.6  TSH  --   --  1.29   Liver Function Tests: Recent Labs    03/16/19 0715 07/07/19 0944  AST 21 22  ALT 13 11  BILITOT 0.4 0.5  PROT 6.5 6.6   No results for input(s): LIPASE, AMYLASE in the last 8760 hours. No results for input(s): AMMONIA in the last 8760 hours. CBC: Recent Labs    10/08/18 1843 07/07/19 0944  WBC 5.1 4.1  NEUTROABS  --  2,620  HGB 13.0 13.0  HCT 42.0 40.2  MCV 95.7 96.9  PLT 187 183   Lipid Panel: Recent Labs    07/07/19 0944  CHOL 177  HDL 59  LDLCALC 93  TRIG 155*  CHOLHDL 3.0   Lab Results  Component Value Date   HGBA1C 5.6 07/07/2019    Procedures since last visit: No results found.  Assessment/Plan Essential hypertension Since patient's blood pressure drops during mid afternoon I have told him to split the dose of Cozaar and take 1 tablet in the morning and 1 tablet in the evening  Osteoporosis, postmenopausal Patient has osteoporosis with a T score of right forearm -4.3 She will be a good patient for Prolia And given information to the daughter to with let me know in next visit Hyperlipidemia, unspecified hyperlipidemia type On Zocor LDL less than 100 Prediabetes A1c less than 6  Unsteady gait Daughter concerned that the patient is unable to walk outside on her driveway due to  gait issues. Gave her a prescription for four-wheel walker Left corneal ulcer and bilateral glaucoma Patient is staying with her daughter so she can monitor her eyedrops Her ophthalmologist has told them that her visual disturbances will continue due to scabbing History of vocal cord paralysis Follows very closely with ENT History of anxiety Continue on Remeron   Labs/tests ordered:  * No order type specified * Next appt:  12/17/2019  Total time spent in this patient care encounter was  40_  minutes; greater than 50% of the visit spent counseling patient and staff, reviewing records , Labs and coordinating care for problems addressed at this encounter.

## 2019-08-09 ENCOUNTER — Other Ambulatory Visit: Payer: Self-pay | Admitting: *Deleted

## 2019-08-09 DIAGNOSIS — Z961 Presence of intraocular lens: Secondary | ICD-10-CM | POA: Diagnosis not present

## 2019-08-09 DIAGNOSIS — H18892 Other specified disorders of cornea, left eye: Secondary | ICD-10-CM | POA: Diagnosis not present

## 2019-08-09 DIAGNOSIS — H401113 Primary open-angle glaucoma, right eye, severe stage: Secondary | ICD-10-CM | POA: Diagnosis not present

## 2019-08-09 MED ORDER — FUROSEMIDE 20 MG PO TABS: 10.0000 mg | ORAL_TABLET | Freq: Every day | ORAL | 1 refills | Status: DC

## 2019-08-09 NOTE — Telephone Encounter (Signed)
Pended Rx and sent to Dr. Gupta for approval due to HIGH ALERT Warning.  °

## 2019-08-09 NOTE — Telephone Encounter (Signed)
Caregiver requested refill.  

## 2019-08-11 DIAGNOSIS — H02045 Spastic entropion of left lower eyelid: Secondary | ICD-10-CM | POA: Diagnosis not present

## 2019-08-11 DIAGNOSIS — H02135 Senile ectropion of left lower eyelid: Secondary | ICD-10-CM | POA: Diagnosis not present

## 2019-08-11 DIAGNOSIS — H18892 Other specified disorders of cornea, left eye: Secondary | ICD-10-CM | POA: Diagnosis not present

## 2019-08-19 DIAGNOSIS — D225 Melanocytic nevi of trunk: Secondary | ICD-10-CM | POA: Diagnosis not present

## 2019-08-19 DIAGNOSIS — L821 Other seborrheic keratosis: Secondary | ICD-10-CM | POA: Diagnosis not present

## 2019-08-19 DIAGNOSIS — D692 Other nonthrombocytopenic purpura: Secondary | ICD-10-CM | POA: Diagnosis not present

## 2019-08-19 DIAGNOSIS — L723 Sebaceous cyst: Secondary | ICD-10-CM | POA: Diagnosis not present

## 2019-08-19 DIAGNOSIS — D3612 Benign neoplasm of peripheral nerves and autonomic nervous system, upper limb, including shoulder: Secondary | ICD-10-CM | POA: Diagnosis not present

## 2019-08-19 DIAGNOSIS — D2271 Melanocytic nevi of right lower limb, including hip: Secondary | ICD-10-CM | POA: Diagnosis not present

## 2019-08-21 DIAGNOSIS — Z23 Encounter for immunization: Secondary | ICD-10-CM | POA: Diagnosis not present

## 2019-08-23 DIAGNOSIS — H35372 Puckering of macula, left eye: Secondary | ICD-10-CM | POA: Diagnosis not present

## 2019-08-23 DIAGNOSIS — H35352 Cystoid macular degeneration, left eye: Secondary | ICD-10-CM | POA: Diagnosis not present

## 2019-09-20 ENCOUNTER — Other Ambulatory Visit: Payer: Self-pay | Admitting: Internal Medicine

## 2019-09-20 DIAGNOSIS — E785 Hyperlipidemia, unspecified: Secondary | ICD-10-CM

## 2019-10-30 ENCOUNTER — Other Ambulatory Visit: Payer: Self-pay | Admitting: Nurse Practitioner

## 2019-11-01 NOTE — Telephone Encounter (Signed)
rx sent to pharmacy by e-script  

## 2019-11-08 DIAGNOSIS — Z23 Encounter for immunization: Secondary | ICD-10-CM | POA: Diagnosis not present

## 2019-11-16 ENCOUNTER — Other Ambulatory Visit: Payer: Self-pay | Admitting: Internal Medicine

## 2019-12-06 DIAGNOSIS — Z23 Encounter for immunization: Secondary | ICD-10-CM | POA: Diagnosis not present

## 2019-12-17 ENCOUNTER — Other Ambulatory Visit: Payer: Medicare Other

## 2019-12-17 ENCOUNTER — Other Ambulatory Visit: Payer: Self-pay

## 2019-12-17 ENCOUNTER — Telehealth: Payer: Self-pay | Admitting: *Deleted

## 2019-12-17 DIAGNOSIS — E785 Hyperlipidemia, unspecified: Secondary | ICD-10-CM

## 2019-12-17 DIAGNOSIS — R7303 Prediabetes: Secondary | ICD-10-CM | POA: Diagnosis not present

## 2019-12-17 DIAGNOSIS — I10 Essential (primary) hypertension: Secondary | ICD-10-CM

## 2019-12-17 NOTE — Telephone Encounter (Signed)
Patient and daughter came in to [facility] this morning wanting labs and there was no orders. Daughter was very upset because it's hard to get patient up in the morning. I looked thru chart and placed orders based on last visit. CMP, CBC, TSH, LIPID, A1C. Pt has appt with Dr. Lyndel Safe Friday 12/24/19.

## 2019-12-18 LAB — CBC WITH DIFFERENTIAL/PLATELET
Absolute Monocytes: 500 cells/uL (ref 200–950)
Basophils Absolute: 40 cells/uL (ref 0–200)
Basophils Relative: 0.8 %
Eosinophils Absolute: 160 cells/uL (ref 15–500)
Eosinophils Relative: 3.2 %
HCT: 39.5 % (ref 35.0–45.0)
Hemoglobin: 13.1 g/dL (ref 11.7–15.5)
Lymphs Abs: 1135 cells/uL (ref 850–3900)
MCH: 31.3 pg (ref 27.0–33.0)
MCHC: 33.2 g/dL (ref 32.0–36.0)
MCV: 94.5 fL (ref 80.0–100.0)
MPV: 11 fL (ref 7.5–12.5)
Monocytes Relative: 10 %
Neutro Abs: 3165 cells/uL (ref 1500–7800)
Neutrophils Relative %: 63.3 %
Platelets: 223 10*3/uL (ref 140–400)
RBC: 4.18 10*6/uL (ref 3.80–5.10)
RDW: 12.7 % (ref 11.0–15.0)
Total Lymphocyte: 22.7 %
WBC: 5 10*3/uL (ref 3.8–10.8)

## 2019-12-18 LAB — LIPID PANEL
Cholesterol: 170 mg/dL (ref <200)
HDL: 61 mg/dL (ref >=50)
LDL Cholesterol (Calc): 88 mg/dL (calc)
Non-HDL Cholesterol (Calc): 109 mg/dL (calc) (ref <130)
Total CHOL/HDL Ratio: 2.8 (calc) (ref <5.0)
Triglycerides: 115 mg/dL (ref <150)

## 2019-12-18 LAB — COMPLETE METABOLIC PANEL WITH GFR
AG Ratio: 1.9 (calc) (ref 1.0–2.5)
ALT: 12 U/L (ref 6–29)
AST: 21 U/L (ref 10–35)
Albumin: 4.2 g/dL (ref 3.6–5.1)
Alkaline phosphatase (APISO): 95 U/L (ref 37–153)
BUN/Creatinine Ratio: 25 (calc) — ABNORMAL HIGH (ref 6–22)
BUN: 24 mg/dL (ref 7–25)
CO2: 34 mmol/L — ABNORMAL HIGH (ref 20–32)
Calcium: 9.6 mg/dL (ref 8.6–10.4)
Chloride: 102 mmol/L (ref 98–110)
Creat: 0.95 mg/dL — ABNORMAL HIGH (ref 0.60–0.88)
GFR, Est African American: 61 mL/min/{1.73_m2} (ref >=60)
GFR, Est Non African American: 53 mL/min/{1.73_m2} — ABNORMAL LOW (ref >=60)
Globulin: 2.2 g/dL (calc) (ref 1.9–3.7)
Glucose, Bld: 98 mg/dL (ref 65–99)
Potassium: 4.2 mmol/L (ref 3.5–5.3)
Sodium: 143 mmol/L (ref 135–146)
Total Bilirubin: 0.4 mg/dL (ref 0.2–1.2)
Total Protein: 6.4 g/dL (ref 6.1–8.1)

## 2019-12-18 LAB — TSH: TSH: 1.58 mIU/L (ref 0.40–4.50)

## 2019-12-18 LAB — HEMOGLOBIN A1C
Hgb A1c MFr Bld: 5.8 % of total Hgb — ABNORMAL HIGH (ref <5.7)
Mean Plasma Glucose: 120 (calc)
eAG (mmol/L): 6.6 (calc)

## 2019-12-24 ENCOUNTER — Other Ambulatory Visit: Payer: Self-pay

## 2019-12-24 ENCOUNTER — Encounter: Payer: Self-pay | Admitting: Internal Medicine

## 2019-12-24 ENCOUNTER — Non-Acute Institutional Stay: Payer: Medicare Other | Admitting: Internal Medicine

## 2019-12-24 VITALS — BP 124/80 | HR 83 | Temp 97.1°F | Ht 64.0 in | Wt 122.6 lb

## 2019-12-24 DIAGNOSIS — M810 Age-related osteoporosis without current pathological fracture: Secondary | ICD-10-CM | POA: Diagnosis not present

## 2019-12-24 DIAGNOSIS — I10 Essential (primary) hypertension: Secondary | ICD-10-CM | POA: Diagnosis not present

## 2019-12-24 DIAGNOSIS — R7303 Prediabetes: Secondary | ICD-10-CM

## 2019-12-24 DIAGNOSIS — E785 Hyperlipidemia, unspecified: Secondary | ICD-10-CM | POA: Diagnosis not present

## 2019-12-25 NOTE — Progress Notes (Signed)
Location:  [place] of Service:  Clinic (12)  Provider:   Code Status:  Goals of Care:  Advanced Directives 08/06/2019  Does Patient Have a Medical Advance Directive? No;Yes  Type of Advance Directive -  Does patient want to make changes to medical advance directive? -  Copy of [place] in Chart? -  Would patient like information on creating a medical advance directive? -  Pre-existing out of facility DNR order (yellow form or pink MOST form) -     Chief Complaint  Patient presents with  . Medical Management of Chronic Issues    Follow Up. She has had some constipation. Ears cleaned. [name] the daughter s concerned with her o2 decreasing and heart rate increasing and blood pressures going up and down. Decreased appetite.    HPI: Patient is a 86 y.o. female seen today for medical management of chronic diseases.    Essential Hypertension BP fluctuates from 110-140. No Dizziness. She had split her dosage into bid dosing to see if it helps lower BP. She is doing well. Low POX Daughter wanted to know if how to Interpret low POX. Usually she gets around 90-95% Recent Corneal Ulcer of Left Eye Also has h/o Bilateral Primary angle Glaucoma Recently underwent eye surgery and is on close follow-up with her ophthalmologist They have told her that she would not get Normal Eye sight due to scaring Weight loss with Poor Appetite Per daughter she doe snot have good appetite. Patient says it is due to her dentures. Has lost few pounds. On Remeron  Constipation Had sever Constipation after Covid Vaccine Says now it is normal Bowel Movements  Patient is living with her daughter right now as she needs help with Eye drops. And to avoid Pandemic isolation She walks with the walker as needed . Other wise independent in her ADLS. No Falls Past Medical History:  Diagnosis Date  . Anemia   . Anxiety   . Apnea 05/2000   sleep study revealed mild  upper airway obstruction & apnea during speel, no set recommendations for therapy  . Asthma   . Diverticulosis   . Dyspnea 12/15/09   spirometer normal & function actually worsens with nebk may have more of a "exercise-induced" type of bronchospasm   . Fatigue 07/2015  . GERD (gastroesophageal reflux disease)   . Glaucoma    both eyes  . Hyperlipidemia   . Hypertension   . Laryngeal paresis 09/12/2016  . Loss of weight 06/20/2016  . OSA (obstructive sleep apnea)   . Osteoporosis, senile   . Vocal cord dysfunction    per ENT, pt NOT to have elective intubation without disscussing with him    Past Surgical History:  Procedure Laterality Date  . CATARACT EXTRACTION W/ INTRAOCULAR LENS  IMPLANT, BILATERAL  2011   Dr. Charise Killian  . CERVICAL CONIZATION W/BX     due to cervical dysplasia  . COLONOSCOPY  02/2002   Diverticublosis   . EYE SURGERY Right 02/2016   laser surgery for scar tissure  . nasolaryngoscopy  2001   reflux laryngitis  . SKIN CANCER EXCISION     ?BCC  . TONSILLECTOMY    . UPPER GI ENDOSCOPY  05/2000   upper airway edema/changes that were consistent with GERD  . vocal fold cordotomy Left 2003   Dr. Denyse Dago [facility]    Allergies  Allergen Reactions  . Brimonidine Other (See Comments)  . Dorzolamide Hcl Itching  EYE PAIN    Outpatient Encounter Medications as of 12/24/2019  Medication Sig  . aspirin EC 81 MG tablet Take 81 mg by mouth daily.  . brinzolamide (AZOPT) 1 % ophthalmic suspension Place 1 drop into both eyes 3 (three) times daily.   . Calcium-Vitamin D-Vitamin K (CALCIUM SOFT CHEWS PO) Take by mouth.  . Coenzyme Q10 (COQ10) 200 MG CAPS Take 1 capsule by mouth daily.   . furosemide (LASIX) 20 MG tablet Take 0.5 tablets (10 mg total) by mouth daily.  Marland Kitchen ketorolac (ACULAR) 0.5 % ophthalmic solution Place 1 drop into the left eye 4 (four) times daily.   Marland Kitchen losartan (COZAAR) 25 MG tablet TAKE TWO TABLETS (50MG ) BY MOUTH ONCE DAILY FOR BLOOD PRESSURE  .  loteprednol (LOTEMAX) 0.2 % SUSP Place 1 drop into the left eye 6 (six) times daily. Left eye   . mirtazapine (REMERON) 30 MG tablet TAKE 1 TABLET BY MOUTH EVERYDAY AT BEDTIME  . Netarsudil Dimesylate (RHOPRESSA) 0.02 % SOLN Apply 1 drop to eye at bedtime. Both eyes  . Omega-3 Fatty Acids (FISH OIL) 1000 MG CAPS Take 1 capsule by mouth daily.   . simvastatin (ZOCOR) 10 MG tablet TAKE ONE TABLET BY MOUTH AT BEDTIME TO CONTROL CHOLESTEROL  . Timolol Maleate 0.5 % (DAILY) SOLN Place 1 drop into both eyes 2 (two) times daily.   . vitamin C (ASCORBIC ACID) 500 MG tablet Take 500 mg by mouth daily.  . Vitamin D, Cholecalciferol, 1000 UNITS CAPS Take 1 capsule by mouth 2 (two) times daily.   Marland Kitchen VYZULTA 0.024 % SOLN Place 1 drop into both eyes at bedtime.    No facility-administered encounter medications on file as of 12/24/2019.    Review of Systems:  Review of Systems  Review of Systems  Constitutional: Negative for activity change, appetite change, chills, diaphoresis, fatigue and fever.  HENT: Negative for mouth sores, postnasal drip, rhinorrhea, sinus pain and sore throat.   Respiratory: Negative for apnea, cough, chest tightness, shortness of breath and wheezing.   Cardiovascular: Negative for chest pain, palpitations and leg swelling.  Gastrointestinal: Negative for abdominal distention, abdominal pain, constipation, diarrhea, nausea and vomiting.  Genitourinary: Negative for dysuria and frequency.  Musculoskeletal: Negative for arthralgias, joint swelling and myalgias.  Skin: Negative for rash.  Neurological: Negative for dizziness, syncope, weakness, light-headedness and numbness.  Psychiatric/Behavioral: Negative for behavioral problems, confusion and sleep disturbance.     Health Maintenance  Topic Date Due  . TETANUS/TDAP  08/07/2025  . INFLUENZA VACCINE  Completed  . DEXA SCAN  Completed  . PNA vac Low Risk Adult  Completed    Physical Exam: Vitals:   12/24/19 1111  BP:  124/80  Pulse: 83  Temp: (!) 97.1 F (36.2 C)  SpO2: 97%  Weight: 122 lb 9.6 oz (55.6 kg)  Height: 5\' 4"  (1.626 m)   Body mass index is 21.04 kg/m. Physical Exam  Constitutional: Oriented to person, place, and time. Well-developed and well-nourished.  HENT:  Head: Normocephalic.  Mouth/Throat: Oropharynx is clear and moist.  Ear slight Wax Eyes: Pupils are equal, round, and reactive to light.  Neck: Neck supple.  Cardiovascular: Normal rate and normal heart sounds.  No murmur heard. Pulmonary/Chest: Effort normal and breath sounds normal. No respiratory distress. No wheezes. She has no rales.  Abdominal: Soft. Bowel sounds are normal. No distension. There is no tenderness. There is no rebound.  Musculoskeletal: Mild Edema Bilateral Lymphadenopathy: none Neurological: Alert and oriented to person, place, and  time.  Gait is stable Skin: Skin is warm and dry.  Psychiatric: Normal mood and affect. Behavior is normal. Thought content normal.    Labs reviewed: Basic Metabolic Panel: Recent Labs    03/16/19 0715 07/07/19 0944 12/17/19 0843  NA 144 142 143  K 4.1 4.4 4.2  CL 105 103 102  CO2 33* 35* 34*  GLUCOSE 103* 98 98  BUN 24 23 24   CREATININE 1.02* 0.97* 0.95*  CALCIUM 9.8 9.6 9.6  TSH  --  1.29 1.58   Liver Function Tests: Recent Labs    03/16/19 0715 07/07/19 0944 12/17/19 0843  AST 21 22 21   ALT 13 11 12   BILITOT 0.4 0.5 0.4  PROT 6.5 6.6 6.4   No results for input(s): LIPASE, AMYLASE in the last 8760 hours. No results for input(s): AMMONIA in the last 8760 hours. CBC: Recent Labs    07/07/19 0944 12/17/19 0843  WBC 4.1 5.0  NEUTROABS 2,620 3,165  HGB 13.0 13.1  HCT 40.2 39.5  MCV 96.9 94.5  PLT 183 223   Lipid Panel: Recent Labs    07/07/19 0944 12/17/19 0843  CHOL 177 170  HDL 59 61  LDLCALC 93 88  TRIG 155* 115  CHOLHDL 3.0 2.8   Lab Results  Component Value Date   HGBA1C 5.8 (H) 12/17/2019    Procedures since last visit: No  results found.  Assessment/Plan  Essential hypertension Continue on Cozaar and Low dose lasix No Changes right now Continue to monitor  Weight loss  D/W the daughter to continue pushing for Supplements Has lost few pounds but overall looks good. Labs today were all normal. Will continue to monitor Not candidate for aggressive work up On Remeron  Osteoporosis, postmenopausal Patient has osteoporosis with a T score of right forearm -4.3 She will be a good patient for Prolia Hold off this time due to her weight loss Will Reval next Visit  Hyperlipidemia, unspecified hyperlipidemia type On Zocor LDL is 88 Prediabetes A1C is 5.8  Unsteady gait They Have not gotten new Walker yet due to patient staying in home most of the time. No Recent Falls  Left corneal ulcer and bilateral glaucoma Patient is staying with her daughter so she can monitor her eyedrops Her ophthalmologist has told them that her visual disturbances will continue due to scabbing  History of vocal cord paralysis Follows very closely with ENT  History of anxiety Continue on Remeron  Vaccination Next visit ? Shingrix Has declined in the Past  Labs/tests ordered:  * No order type specified * Next appt:  04/11/2020    Total time spent in this patient care encounter was  45_  minutes; greater than 50% of the visit spent counseling patient and staff, reviewing records , Labs and coordinating care for problems addressed at this encounter.

## 2019-12-31 DIAGNOSIS — H35352 Cystoid macular degeneration, left eye: Secondary | ICD-10-CM | POA: Diagnosis not present

## 2019-12-31 DIAGNOSIS — H35372 Puckering of macula, left eye: Secondary | ICD-10-CM | POA: Diagnosis not present

## 2020-01-19 ENCOUNTER — Other Ambulatory Visit: Payer: Self-pay | Admitting: Internal Medicine

## 2020-01-19 ENCOUNTER — Other Ambulatory Visit: Payer: Self-pay | Admitting: *Deleted

## 2020-01-19 NOTE — Telephone Encounter (Signed)
High risk or very high risk warning populated when attempting to refill medication. RX request sent to PCP for review and approval if warranted.   

## 2020-01-19 NOTE — Telephone Encounter (Signed)
Patient caregiver requested refill Pended Rx and sent to Dr. Lyndel Safe for approval due to [place].

## 2020-03-13 ENCOUNTER — Other Ambulatory Visit: Payer: Self-pay | Admitting: Internal Medicine

## 2020-03-13 DIAGNOSIS — E785 Hyperlipidemia, unspecified: Secondary | ICD-10-CM

## 2020-04-14 ENCOUNTER — Encounter: Payer: Self-pay | Admitting: Internal Medicine

## 2020-04-19 DIAGNOSIS — H401113 Primary open-angle glaucoma, right eye, severe stage: Secondary | ICD-10-CM | POA: Diagnosis not present

## 2020-04-19 DIAGNOSIS — H401122 Primary open-angle glaucoma, left eye, moderate stage: Secondary | ICD-10-CM | POA: Diagnosis not present

## 2020-05-01 ENCOUNTER — Other Ambulatory Visit: Payer: Self-pay | Admitting: Internal Medicine

## 2020-05-15 ENCOUNTER — Other Ambulatory Visit: Payer: Self-pay | Admitting: Nurse Practitioner

## 2020-05-15 DIAGNOSIS — M810 Age-related osteoporosis without current pathological fracture: Secondary | ICD-10-CM

## 2020-05-15 DIAGNOSIS — K219 Gastro-esophageal reflux disease without esophagitis: Secondary | ICD-10-CM

## 2020-05-15 DIAGNOSIS — R7303 Prediabetes: Secondary | ICD-10-CM

## 2020-05-15 DIAGNOSIS — I10 Essential (primary) hypertension: Secondary | ICD-10-CM

## 2020-05-16 ENCOUNTER — Other Ambulatory Visit: Payer: Self-pay

## 2020-05-16 DIAGNOSIS — I10 Essential (primary) hypertension: Secondary | ICD-10-CM

## 2020-05-16 DIAGNOSIS — K219 Gastro-esophageal reflux disease without esophagitis: Secondary | ICD-10-CM

## 2020-05-16 DIAGNOSIS — M810 Age-related osteoporosis without current pathological fracture: Secondary | ICD-10-CM

## 2020-05-16 DIAGNOSIS — R7303 Prediabetes: Secondary | ICD-10-CM

## 2020-05-17 ENCOUNTER — Other Ambulatory Visit: Payer: Self-pay | Admitting: Internal Medicine

## 2020-05-17 DIAGNOSIS — M810 Age-related osteoporosis without current pathological fracture: Secondary | ICD-10-CM | POA: Diagnosis not present

## 2020-05-17 DIAGNOSIS — K219 Gastro-esophageal reflux disease without esophagitis: Secondary | ICD-10-CM | POA: Diagnosis not present

## 2020-05-17 DIAGNOSIS — R7303 Prediabetes: Secondary | ICD-10-CM | POA: Diagnosis not present

## 2020-05-17 DIAGNOSIS — I10 Essential (primary) hypertension: Secondary | ICD-10-CM | POA: Diagnosis not present

## 2020-05-19 ENCOUNTER — Encounter: Payer: Self-pay | Admitting: Internal Medicine

## 2020-05-19 ENCOUNTER — Non-Acute Institutional Stay: Payer: Medicare Other | Admitting: Internal Medicine

## 2020-05-19 ENCOUNTER — Other Ambulatory Visit: Payer: Self-pay

## 2020-05-19 VITALS — BP 138/78 | HR 76 | Temp 97.3°F | Ht 64.0 in | Wt 117.2 lb

## 2020-05-19 DIAGNOSIS — I10 Essential (primary) hypertension: Secondary | ICD-10-CM | POA: Diagnosis not present

## 2020-05-19 DIAGNOSIS — Z8709 Personal history of other diseases of the respiratory system: Secondary | ICD-10-CM | POA: Diagnosis not present

## 2020-05-19 DIAGNOSIS — E785 Hyperlipidemia, unspecified: Secondary | ICD-10-CM | POA: Diagnosis not present

## 2020-05-19 DIAGNOSIS — K219 Gastro-esophageal reflux disease without esophagitis: Secondary | ICD-10-CM | POA: Diagnosis not present

## 2020-05-19 DIAGNOSIS — F419 Anxiety disorder, unspecified: Secondary | ICD-10-CM | POA: Diagnosis not present

## 2020-05-19 DIAGNOSIS — M810 Age-related osteoporosis without current pathological fracture: Secondary | ICD-10-CM | POA: Diagnosis not present

## 2020-05-19 DIAGNOSIS — R7303 Prediabetes: Secondary | ICD-10-CM

## 2020-05-19 DIAGNOSIS — R634 Abnormal weight loss: Secondary | ICD-10-CM

## 2020-05-19 DIAGNOSIS — R2681 Unsteadiness on feet: Secondary | ICD-10-CM

## 2020-05-20 LAB — CBC WITH DIFFERENTIAL/PLATELET
Absolute Monocytes: 616 cells/uL (ref 200–950)
Basophils Absolute: 40 cells/uL (ref 0–200)
Basophils Relative: 0.7 %
Eosinophils Absolute: 211 cells/uL (ref 15–500)
Eosinophils Relative: 3.7 %
HCT: 39.3 % (ref 35.0–45.0)
Hemoglobin: 12.9 g/dL (ref 11.7–15.5)
Lymphs Abs: 1767 cells/uL (ref 850–3900)
MCH: 30.9 pg (ref 27.0–33.0)
MCHC: 32.8 g/dL (ref 32.0–36.0)
MCV: 94 fL (ref 80.0–100.0)
MPV: 11.3 fL (ref 7.5–12.5)
Monocytes Relative: 10.8 %
Neutro Abs: 3067 cells/uL (ref 1500–7800)
Neutrophils Relative %: 53.8 %
Platelets: 207 10*3/uL (ref 140–400)
RBC: 4.18 10*6/uL (ref 3.80–5.10)
RDW: 12.5 % (ref 11.0–15.0)
Total Lymphocyte: 31 %
WBC: 5.7 10*3/uL (ref 3.8–10.8)

## 2020-05-20 LAB — COMPLETE METABOLIC PANEL WITH GFR
AG Ratio: 1.9 (calc) (ref 1.0–2.5)
ALT: 13 U/L (ref 6–29)
AST: 22 U/L (ref 10–35)
Albumin: 4.3 g/dL (ref 3.6–5.1)
Alkaline phosphatase (APISO): 98 U/L (ref 37–153)
BUN/Creatinine Ratio: 31 (calc) — ABNORMAL HIGH (ref 6–22)
BUN: 31 mg/dL — ABNORMAL HIGH (ref 7–25)
CO2: 34 mmol/L — ABNORMAL HIGH (ref 20–32)
Calcium: 9.7 mg/dL (ref 8.6–10.4)
Chloride: 104 mmol/L (ref 98–110)
Creat: 0.99 mg/dL — ABNORMAL HIGH (ref 0.60–0.88)
GFR, Est African American: 58 mL/min/{1.73_m2} — ABNORMAL LOW (ref >=60)
GFR, Est Non African American: 50 mL/min/{1.73_m2} — ABNORMAL LOW (ref >=60)
Globulin: 2.3 g/dL (calc) (ref 1.9–3.7)
Glucose, Bld: 93 mg/dL (ref 65–99)
Potassium: 4.6 mmol/L (ref 3.5–5.3)
Sodium: 140 mmol/L (ref 135–146)
Total Bilirubin: 0.4 mg/dL (ref 0.2–1.2)
Total Protein: 6.6 g/dL (ref 6.1–8.1)

## 2020-05-20 LAB — HEMOGLOBIN A1C
Hgb A1c MFr Bld: 5.6 % of total Hgb (ref <5.7)
Mean Plasma Glucose: 114 (calc)
eAG (mmol/L): 6.3 (calc)

## 2020-05-20 LAB — VITAMIN D 1,25 DIHYDROXY
Vitamin D 1, 25 (OH)2 Total: 40 pg/mL (ref 18–72)
Vitamin D2 1, 25 (OH)2: <8 pg/mL
Vitamin D3 1, 25 (OH)2: 40 pg/mL

## 2020-05-20 NOTE — Progress Notes (Signed)
Location:  [place] of Service:  Clinic (12)  Provider:   Code Status: Goals of Care:  Advanced Directives 08/06/2019  Does Patient Have a Medical Advance Directive? No;Yes  Type of Advance Directive -  Does patient want to make changes to medical advance directive? -  Copy of [place] in Chart? -  Would patient like information on creating a medical advance directive? -  Pre-existing out of facility DNR order (yellow form or pink MOST form) -     Chief Complaint  Patient presents with  . Medical Management of Chronic Issues    [name] for follow up.   Marland Kitchen Health Maintenance    COVID 19    HPI: Patient is a 86 y.o. female seen today for medical management of chronic diseases.   Her active problems  Continue with weight loss and poor appetite Patient has lost few more pounds since her last visit with me. I discussed again with the daughter and the patient.  [name].  It takes her a lot of effort to eat. Patient said that she is doing the best she right now  History of corneal Ulcer of left eye and bilateral glaucoma Patient is doing better now.  Her vision is better.  She is able to do all her ADLs without any assist. Hypertension Blood pressure doing better by splitting the Cozaar twice daily Unstable gait Patient has now moved back to the facility from her daughter's house.  She has not had any falls but is unable to use the walker or cane.  Is using the support of the furniture in the wall to walk.  Daughter is concerned that she will still have a fall. Osteoporosis Is open and wants to talk about Prolia.  Patient also has a history of by lateral vocal cord paralysis s/p left posterior cordotomy in 2003.  Hearing loss, depression, hyperlipidemia  Past Medical History:  Diagnosis Date  . Anemia   . Anxiety   . Apnea 05/2000   sleep study revealed mild upper airway  obstruction & apnea during speel, no set recommendations for therapy  . Asthma   . Diverticulosis   . Dyspnea 12/15/09   spirometer normal & function actually worsens with nebk may have more of a "exercise-induced" type of bronchospasm   . Fatigue 07/2015  . GERD (gastroesophageal reflux disease)   . Glaucoma    both eyes  . Hyperlipidemia   . Hypertension   . Laryngeal paresis 09/12/2016  . Loss of weight 06/20/2016  . OSA (obstructive sleep apnea)   . Osteoporosis, senile   . Vocal cord dysfunction    per ENT, pt NOT to have elective intubation without disscussing with him    Past Surgical History:  Procedure Laterality Date  . CATARACT EXTRACTION W/ INTRAOCULAR LENS  IMPLANT, BILATERAL  2011   Dr. Charise Killian  . CERVICAL CONIZATION W/BX     due to cervical dysplasia  . COLONOSCOPY  02/2002   Diverticublosis   . EYE SURGERY Right 02/2016   laser surgery for scar tissure  . nasolaryngoscopy  2001   reflux laryngitis  . SKIN CANCER EXCISION     ?BCC  . TONSILLECTOMY    . UPPER GI ENDOSCOPY  05/2000   upper airway edema/changes that were consistent with GERD  . vocal fold cordotomy Left 2003   Dr. Denyse Dago [facility]    Allergies  Allergen Reactions  . Brimonidine Other (See Comments)  . Dorzolamide Hcl Itching    EYE PAIN    Outpatient Encounter Medications as of 05/19/2020  Medication Sig  . aspirin EC 81 MG tablet Take 81 mg by mouth daily.  . brinzolamide (AZOPT) 1 % ophthalmic suspension Place 1 drop into both eyes 3 (three) times daily.   . Calcium-Vitamin D-Vitamin K (CALCIUM SOFT CHEWS PO) Take by mouth.  . Coenzyme Q10 (COQ10) 200 MG CAPS Take 1 capsule by mouth daily.   . furosemide (LASIX) 20 MG tablet TAKE 1/2 TABLET BY MOUTH DAILY  . ketorolac (ACULAR) 0.5 % ophthalmic solution Place 1 drop into the left eye in the morning and at bedtime.   Marland Kitchen losartan (COZAAR) 25 MG tablet TAKE TWO TABLETS (50MG ) BY MOUTH ONCE DAILY FOR BLOOD PRESSURE (Patient taking differently:  Take one tablets (50mg ) by mouth twice daily for blood pressure)  . mirtazapine (REMERON) 30 MG tablet TAKE 1 TABLET BY MOUTH EVERYDAY AT BEDTIME  . Netarsudil Dimesylate (RHOPRESSA) 0.02 % SOLN Apply 1 drop to eye at bedtime. Both eyes  . Omega-3 Fatty Acids (FISH OIL) 1000 MG CAPS Take 1 capsule by mouth daily.   . simvastatin (ZOCOR) 10 MG tablet TAKE ONE TABLET BY MOUTH AT BEDTIME TO CONTROL CHOLESTEROL  . Timolol Maleate 0.5 % (DAILY) SOLN Place 1 drop into both eyes 2 (two) times daily.   . vitamin C (ASCORBIC ACID) 500 MG tablet Take 500 mg by mouth daily.  . Vitamin D, Cholecalciferol, 1000 UNITS CAPS Take 1 capsule by mouth 2 (two) times daily.   Marland Kitchen VYZULTA 0.024 % SOLN Place 1 drop into both eyes at bedtime.   Marland Kitchen loteprednol (LOTEMAX) 0.2 % SUSP Place 1 drop into the left eye 6 (six) times daily. Left eye  (Patient not taking: Reported on 05/19/2020)   No facility-administered encounter medications on file as of 05/19/2020.    Review of Systems:  Review of Systems  Constitutional: Positive for appetite change.  HENT: Positive for dental problem.   Respiratory: Negative.   Cardiovascular: Negative.   Gastrointestinal: Positive for constipation.  Genitourinary: Negative.   Musculoskeletal: Positive for gait problem.  Skin: Negative.   Neurological: Negative for dizziness.  Psychiatric/Behavioral: Negative.     Health Maintenance  Topic Date Due  . COVID-19 Vaccine (1) Never done  . INFLUENZA VACCINE  06/04/2020  . TETANUS/TDAP  08/07/2025  . DEXA SCAN  Completed  . PNA vac Low Risk Adult  Completed    Physical Exam: Vitals:   05/19/20 1136  BP: 138/78  Pulse: 76  Temp: (!) 97.3 F (36.3 C)  SpO2: 93%  Weight: 117 lb 3.2 oz (53.2 kg)  Height: 5\' 4"  (1.626 m)   Body mass index is 20.12 kg/m. Physical Exam Vitals reviewed.  HENT:     Head: Normocephalic.     Ears:     Comments: Mild wax in both ears    Nose: Nose normal.     Mouth/Throat:     Mouth: Mucous  membranes are moist.     Pharynx: Oropharynx is clear.  Eyes:     Pupils: Pupils are equal, round, and reactive to light.  Cardiovascular:     Rate and Rhythm: Normal rate and regular rhythm.     Pulses: Normal pulses.  Pulmonary:     Effort: Pulmonary effort is normal.     Breath sounds: Normal breath sounds.  Abdominal:     General: Abdomen is flat. Bowel  sounds are normal.     Palpations: Abdomen is soft.  Musculoskeletal:        General: No swelling.     Cervical back: Neck supple.  Skin:    General: Skin is warm.  Neurological:     General: No focal deficit present.     Mental Status: She is alert and oriented to person, place, and time.     Comments: Was able to stand up without any support but the gait is mildly unsteady  Psychiatric:        Mood and Affect: Mood normal.     Labs reviewed: Basic Metabolic Panel: Recent Labs    07/07/19 0944 12/17/19 0843 05/17/20 0700  NA 142 143 140  K 4.4 4.2 4.6  CL 103 102 104  CO2 35* 34* 34*  GLUCOSE 98 98 93  BUN 23 24 31*  CREATININE 0.97* 0.95* 0.99*  CALCIUM 9.6 9.6 9.7  TSH 1.29 1.58  --    Liver Function Tests: Recent Labs    07/07/19 0944 12/17/19 0843 05/17/20 0700  AST 22 21 22   ALT 11 12 13   BILITOT 0.5 0.4 0.4  PROT 6.6 6.4 6.6   No results for input(s): LIPASE, AMYLASE in the last 8760 hours. No results for input(s): AMMONIA in the last 8760 hours. CBC: Recent Labs    07/07/19 0944 12/17/19 0843 05/17/20 0700  WBC 4.1 5.0 5.7  NEUTROABS 2,620 3,165 3,067  HGB 13.0 13.1 12.9  HCT 40.2 39.5 39.3  MCV 96.9 94.5 94.0  PLT 183 223 207   Lipid Panel: Recent Labs    07/07/19 0944 12/17/19 0843  CHOL 177 170  HDL 59 61  LDLCALC 93 88  TRIG 155* 115  CHOLHDL 3.0 2.8   Lab Results  Component Value Date   HGBA1C 5.6 05/17/2020    Procedures since last visit: No results found.  Assessment/Plan Weight loss Discussed with the patient and the daughter .  Patient is already on  Remeron Labs are normal I do think her weight loss is related to her dentures. I did discuss about GI referral or further imaging with CT scan of chest and abdomen. Patient gets very upset and does not want anything more aggressive at this time. She is going to try supplements and will come and see me in 3 months to reevaluate her weight  Osteoporosis, postmenopausal T score of -4.3. Patient is more open for Prolia I did tell her that once she goes on Prolia she cannot come off it. We will talk about it in next visit Prediabetes A1C 5.6 Essential hypertension Controlled on Cozaar Hyperlipidemia,  Continue Zocor LDL 88 Unsteady gait Referral to physical therapy to evaluate for need of walker Anxiety with insomnia Continue Remeron Left corneal ulcer and bilateral glaucoma By daughter patient has improved a lot.  And doing well. H/O vocal cord paralysis Does not follow with ENT anymore  Hard of hearing Will be scheduled for earwax removal.   And then the daughter will take her for auditory evaluation  Labs/tests ordered:  * No order type specified * Next appt:  09/08/2020

## 2020-06-07 DIAGNOSIS — H35372 Puckering of macula, left eye: Secondary | ICD-10-CM | POA: Diagnosis not present

## 2020-06-07 DIAGNOSIS — H35352 Cystoid macular degeneration, left eye: Secondary | ICD-10-CM | POA: Diagnosis not present

## 2020-06-12 DIAGNOSIS — H44519 Absolute glaucoma, unspecified eye: Secondary | ICD-10-CM | POA: Diagnosis not present

## 2020-06-12 DIAGNOSIS — M6281 Muscle weakness (generalized): Secondary | ICD-10-CM | POA: Diagnosis not present

## 2020-06-12 DIAGNOSIS — M8580 Other specified disorders of bone density and structure, unspecified site: Secondary | ICD-10-CM | POA: Diagnosis not present

## 2020-06-12 DIAGNOSIS — I10 Essential (primary) hypertension: Secondary | ICD-10-CM | POA: Diagnosis not present

## 2020-06-12 DIAGNOSIS — M1990 Unspecified osteoarthritis, unspecified site: Secondary | ICD-10-CM | POA: Diagnosis not present

## 2020-06-12 DIAGNOSIS — R2681 Unsteadiness on feet: Secondary | ICD-10-CM | POA: Diagnosis not present

## 2020-06-12 DIAGNOSIS — E782 Mixed hyperlipidemia: Secondary | ICD-10-CM | POA: Diagnosis not present

## 2020-06-13 DIAGNOSIS — M8580 Other specified disorders of bone density and structure, unspecified site: Secondary | ICD-10-CM | POA: Diagnosis not present

## 2020-06-13 DIAGNOSIS — M1990 Unspecified osteoarthritis, unspecified site: Secondary | ICD-10-CM | POA: Diagnosis not present

## 2020-06-13 DIAGNOSIS — E782 Mixed hyperlipidemia: Secondary | ICD-10-CM | POA: Diagnosis not present

## 2020-06-13 DIAGNOSIS — R2681 Unsteadiness on feet: Secondary | ICD-10-CM | POA: Diagnosis not present

## 2020-06-13 DIAGNOSIS — I10 Essential (primary) hypertension: Secondary | ICD-10-CM | POA: Diagnosis not present

## 2020-06-13 DIAGNOSIS — M6281 Muscle weakness (generalized): Secondary | ICD-10-CM | POA: Diagnosis not present

## 2020-06-15 ENCOUNTER — Encounter: Payer: Self-pay | Admitting: Nurse Practitioner

## 2020-06-15 ENCOUNTER — Other Ambulatory Visit: Payer: Self-pay

## 2020-06-15 ENCOUNTER — Non-Acute Institutional Stay: Payer: Medicare Other | Admitting: Nurse Practitioner

## 2020-06-15 DIAGNOSIS — F418 Other specified anxiety disorders: Secondary | ICD-10-CM | POA: Diagnosis not present

## 2020-06-15 DIAGNOSIS — I10 Essential (primary) hypertension: Secondary | ICD-10-CM

## 2020-06-15 DIAGNOSIS — E785 Hyperlipidemia, unspecified: Secondary | ICD-10-CM | POA: Diagnosis not present

## 2020-06-15 NOTE — Assessment & Plan Note (Addendum)
Blood pressure is controlled, continue Losartan, ASA, Furosemide.

## 2020-06-15 NOTE — Assessment & Plan Note (Signed)
Stable, Hx of Effexor, Sertraline, continue Mirtazapine.

## 2020-06-15 NOTE — Assessment & Plan Note (Signed)
Continue Omega 3, Simvastatin. LDL 88 12/17/19

## 2020-06-15 NOTE — Progress Notes (Signed)
Location:   clinic [place]   Place of Service:  Clinic (12) Provider: Marlana Latus NP  Code Status: DNR Goals of Care: [place] Advanced [place]ives 08/06/2019  Does Patient Have a Medical Advance Directive? No;Yes  Type of Advance Directive -  Does patient want to make changes to medical advance directive? -  Copy of [place] in Chart? -  Would patient like information on creating a medical advance directive? -  Pre-existing out of facility DNR order (yellow form or pink MOST form) -     Chief Complaint  Patient presents with  . Health Maintenance    COVID 19  . Acute Visit    Patient returns to the clinic for ear lavage.    HPI: Patient is a 86 y.o. female seen today for medical management of chronic diseases.    HTN, takes Losartan, ASA, Furosemide 10mg    Depression, takes Mirtazapine  Hyperlipidemia, takes Omega 3, Simvastatin. LDL 88 12/17/19    Past Medical History:  Diagnosis Date  . Anemia   . Anxiety   . Apnea 05/2000   sleep study revealed mild upper airway obstruction & apnea during speel, no set recommendations for therapy  . Asthma   . Diverticulosis   . Dyspnea 12/15/09   spirometer normal & function actually worsens with nebk may have more of a "exercise-induced" type of bronchospasm   . Fatigue 07/2015  . GERD (gastroesophageal reflux disease)   . Glaucoma    both eyes  . Hyperlipidemia   . Hypertension   . Laryngeal paresis 09/12/2016  . Loss of weight 06/20/2016  . OSA (obstructive sleep apnea)   . Osteoporosis, senile   . Vocal cord dysfunction    per ENT, pt NOT to have elective intubation without disscussing with him    Past Surgical History:  Procedure Laterality Date  . CATARACT EXTRACTION W/ INTRAOCULAR LENS  IMPLANT, BILATERAL  2011   Dr. Charise Killian  . CERVICAL CONIZATION W/BX     due to cervical dysplasia  . COLONOSCOPY  02/2002   Diverticublosis   . EYE SURGERY Right 02/2016   laser surgery for scar tissure  . nasolaryngoscopy   2001   reflux laryngitis  . SKIN CANCER EXCISION     ?BCC  . TONSILLECTOMY    . UPPER GI ENDOSCOPY  05/2000   upper airway edema/changes that were consistent with GERD  . vocal fold cordotomy Left 2003   Dr. Denyse Dago [facility]    Allergies  Allergen Reactions  . Brimonidine Other (See Comments)  . Dorzolamide Hcl Itching    EYE PAIN    Allergies as of 06/15/2020      Reactions   Brimonidine Other (See Comments)   Dorzolamide Hcl Itching   EYE PAIN      Medication List       Accurate as of June 15, 2020  5:03 PM. If you have any questions, ask your nurse or doctor.        aspirin EC 81 MG tablet Take 81 mg by mouth daily.   brinzolamide 1 % ophthalmic suspension Commonly known as: AZOPT Place 1 drop into both eyes 3 (three) times daily.   CALCIUM SOFT CHEWS PO Take by mouth.   CoQ10 200 MG Caps Take 1 capsule by mouth daily.   Fish Oil 1000 MG Caps Take 1 capsule by mouth daily.   furosemide 20 MG tablet Commonly known as: LASIX TAKE 1/2 TABLET BY MOUTH DAILY   ketorolac 0.5 %  ophthalmic solution Commonly known as: ACULAR Place 1 drop into the left eye in the morning and at bedtime.   losartan 25 MG tablet Commonly known as: COZAAR TAKE TWO TABLETS (50MG ) BY MOUTH ONCE DAILY FOR BLOOD PRESSURE What changed: See the new instructions.   loteprednol 0.2 % Susp Commonly known as: LOTEMAX Place 1 drop into the left eye 6 (six) times daily. Left eye   mirtazapine 30 MG tablet Commonly known as: REMERON TAKE 1 TABLET BY MOUTH EVERYDAY AT BEDTIME   Rhopressa 0.02 % Soln Generic drug: Netarsudil Dimesylate Apply 1 drop to eye at bedtime. Both eyes   simvastatin 10 MG tablet Commonly known as: ZOCOR TAKE ONE TABLET BY MOUTH AT BEDTIME TO CONTROL CHOLESTEROL   Timolol Maleate 0.5 % (DAILY) Soln Place 1 drop into both eyes 2 (two) times daily.   vitamin C 500 MG tablet Commonly known as: ASCORBIC ACID Take 500 mg by mouth daily.   Vitamin D  (Cholecalciferol) 25 MCG (1000 UT) Caps Take 1 capsule by mouth 2 (two) times daily.   Vyzulta 0.024 % Soln Generic drug: Latanoprostene Bunod Place 1 drop into both eyes at bedtime.       Review of Systems:  Review of Systems  Constitutional: Negative for appetite change, fatigue and fever.  HENT: Positive for hearing loss and voice change. Negative for congestion.        Vocal cord paralysis.   Respiratory: Positive for wheezing. Negative for cough and shortness of breath.   Cardiovascular: Negative for chest pain, palpitations and leg swelling.  Gastrointestinal: Negative for abdominal pain, constipation, nausea and vomiting.  Genitourinary: Negative for dysuria and urgency.  Musculoskeletal: Positive for arthralgias and gait problem.  Skin: Negative for color change.  Neurological: Negative for speech difficulty, weakness and headaches.       Memory lapses.   Psychiatric/Behavioral: Negative for behavioral problems, hallucinations and sleep disturbance. The patient is not nervous/anxious.     Health Maintenance  Topic Date Due  . COVID-19 Vaccine (1) Never done  . INFLUENZA VACCINE  06/04/2020  . TETANUS/TDAP  08/07/2025  . DEXA SCAN  Completed  . PNA vac Low Risk Adult  Completed    Physical Exam: There were no vitals filed for this visit. There is no height or weight on file to calculate BMI. Physical Exam Vitals reviewed.  Constitutional:      Appearance: Normal appearance.  HENT:     Head: Normocephalic and atraumatic.     Ears:     Comments: No ear wax    Nose: Nose normal.     Mouth/Throat:     Mouth: Mucous membranes are moist.     Comments: Torus plantinus Eyes:     Extraocular Movements: Extraocular movements intact.     Conjunctiva/sclera: Conjunctivae normal.     Pupils: Pupils are equal, round, and reactive to light.     Comments: Left lower eyelid entropion, Hx of left corneal ulceration, R+L glaucoma, able to read  Cardiovascular:     Rate and  Rhythm: Normal rate and regular rhythm.     Heart sounds: No murmur heard.   Pulmonary:     Effort: Pulmonary effort is normal.     Breath sounds: Wheezing present. No rhonchi or rales.     Comments: Anterior neck, sternal region wheezes, disappearing when breathing with mouth closed.   Abdominal:     Palpations: Abdomen is soft.     Tenderness: There is no abdominal tenderness. There is no  guarding.  Musculoskeletal:     Cervical back: Normal range of motion and neck supple.     Right lower leg: No edema.     Left lower leg: No edema.  Skin:    General: Skin is warm and dry.  Neurological:     General: No focal deficit present.     Mental Status: She is alert. Mental status is at baseline.     Motor: No weakness.     Gait: Gait normal.     Comments: Oriented to person and place. Able to walk  Psychiatric:        Mood and Affect: Mood normal.        Behavior: Behavior normal.        Thought Content: Thought content normal.        Judgment: Judgment normal.     Labs reviewed: Basic Metabolic Panel: Recent Labs    07/07/19 0944 12/17/19 0843 05/17/20 0700  NA 142 143 140  K 4.4 4.2 4.6  CL 103 102 104  CO2 35* 34* 34*  GLUCOSE 98 98 93  BUN 23 24 31*  CREATININE 0.97* 0.95* 0.99*  CALCIUM 9.6 9.6 9.7  TSH 1.29 1.58  --    Liver Function Tests: Recent Labs    07/07/19 0944 12/17/19 0843 05/17/20 0700  AST 22 21 22   ALT 11 12 13   BILITOT 0.5 0.4 0.4  PROT 6.6 6.4 6.6   No results for input(s): LIPASE, AMYLASE in the last 8760 hours. No results for input(s): AMMONIA in the last 8760 hours. CBC: Recent Labs    07/07/19 0944 12/17/19 0843 05/17/20 0700  WBC 4.1 5.0 5.7  NEUTROABS 2,620 3,165 3,067  HGB 13.0 13.1 12.9  HCT 40.2 39.5 39.3  MCV 96.9 94.5 94.0  PLT 183 223 207   Lipid Panel: Recent Labs    07/07/19 0944 12/17/19 0843  CHOL 177 170  HDL 59 61  LDLCALC 93 88  TRIG 155* 115  CHOLHDL 3.0 2.8   Lab Results  Component Value Date    HGBA1C 5.6 05/17/2020    Procedures since last visit: No results found.  Assessment/Plan  HTN (hypertension) Blood pressure is controlled, continue Losartan, ASA, Furosemide.   Depression with anxiety Stable, Hx of Effexor, Sertraline, continue Mirtazapine.   Hyperlipidemia  Continue Omega 3, Simvastatin. LDL 88 12/17/19   Labs/tests ordered:  None  Next appt:  09/08/2020

## 2020-06-17 DIAGNOSIS — M6281 Muscle weakness (generalized): Secondary | ICD-10-CM | POA: Diagnosis not present

## 2020-06-17 DIAGNOSIS — M1990 Unspecified osteoarthritis, unspecified site: Secondary | ICD-10-CM | POA: Diagnosis not present

## 2020-06-17 DIAGNOSIS — E782 Mixed hyperlipidemia: Secondary | ICD-10-CM | POA: Diagnosis not present

## 2020-06-17 DIAGNOSIS — I10 Essential (primary) hypertension: Secondary | ICD-10-CM | POA: Diagnosis not present

## 2020-06-17 DIAGNOSIS — R2681 Unsteadiness on feet: Secondary | ICD-10-CM | POA: Diagnosis not present

## 2020-06-17 DIAGNOSIS — M8580 Other specified disorders of bone density and structure, unspecified site: Secondary | ICD-10-CM | POA: Diagnosis not present

## 2020-06-19 DIAGNOSIS — M8580 Other specified disorders of bone density and structure, unspecified site: Secondary | ICD-10-CM | POA: Diagnosis not present

## 2020-06-19 DIAGNOSIS — E782 Mixed hyperlipidemia: Secondary | ICD-10-CM | POA: Diagnosis not present

## 2020-06-19 DIAGNOSIS — I10 Essential (primary) hypertension: Secondary | ICD-10-CM | POA: Diagnosis not present

## 2020-06-19 DIAGNOSIS — M6281 Muscle weakness (generalized): Secondary | ICD-10-CM | POA: Diagnosis not present

## 2020-06-19 DIAGNOSIS — R2681 Unsteadiness on feet: Secondary | ICD-10-CM | POA: Diagnosis not present

## 2020-06-19 DIAGNOSIS — M1990 Unspecified osteoarthritis, unspecified site: Secondary | ICD-10-CM | POA: Diagnosis not present

## 2020-06-21 DIAGNOSIS — M6281 Muscle weakness (generalized): Secondary | ICD-10-CM | POA: Diagnosis not present

## 2020-06-21 DIAGNOSIS — E782 Mixed hyperlipidemia: Secondary | ICD-10-CM | POA: Diagnosis not present

## 2020-06-21 DIAGNOSIS — M1990 Unspecified osteoarthritis, unspecified site: Secondary | ICD-10-CM | POA: Diagnosis not present

## 2020-06-21 DIAGNOSIS — I10 Essential (primary) hypertension: Secondary | ICD-10-CM | POA: Diagnosis not present

## 2020-06-21 DIAGNOSIS — R2681 Unsteadiness on feet: Secondary | ICD-10-CM | POA: Diagnosis not present

## 2020-06-21 DIAGNOSIS — M8580 Other specified disorders of bone density and structure, unspecified site: Secondary | ICD-10-CM | POA: Diagnosis not present

## 2020-06-23 DIAGNOSIS — E782 Mixed hyperlipidemia: Secondary | ICD-10-CM | POA: Diagnosis not present

## 2020-06-23 DIAGNOSIS — M1990 Unspecified osteoarthritis, unspecified site: Secondary | ICD-10-CM | POA: Diagnosis not present

## 2020-06-23 DIAGNOSIS — M6281 Muscle weakness (generalized): Secondary | ICD-10-CM | POA: Diagnosis not present

## 2020-06-23 DIAGNOSIS — R2681 Unsteadiness on feet: Secondary | ICD-10-CM | POA: Diagnosis not present

## 2020-06-23 DIAGNOSIS — M8580 Other specified disorders of bone density and structure, unspecified site: Secondary | ICD-10-CM | POA: Diagnosis not present

## 2020-06-23 DIAGNOSIS — I10 Essential (primary) hypertension: Secondary | ICD-10-CM | POA: Diagnosis not present

## 2020-06-26 DIAGNOSIS — M8580 Other specified disorders of bone density and structure, unspecified site: Secondary | ICD-10-CM | POA: Diagnosis not present

## 2020-06-26 DIAGNOSIS — M1990 Unspecified osteoarthritis, unspecified site: Secondary | ICD-10-CM | POA: Diagnosis not present

## 2020-06-26 DIAGNOSIS — E782 Mixed hyperlipidemia: Secondary | ICD-10-CM | POA: Diagnosis not present

## 2020-06-26 DIAGNOSIS — M6281 Muscle weakness (generalized): Secondary | ICD-10-CM | POA: Diagnosis not present

## 2020-06-26 DIAGNOSIS — I10 Essential (primary) hypertension: Secondary | ICD-10-CM | POA: Diagnosis not present

## 2020-06-26 DIAGNOSIS — R2681 Unsteadiness on feet: Secondary | ICD-10-CM | POA: Diagnosis not present

## 2020-06-28 DIAGNOSIS — E782 Mixed hyperlipidemia: Secondary | ICD-10-CM | POA: Diagnosis not present

## 2020-06-28 DIAGNOSIS — R2681 Unsteadiness on feet: Secondary | ICD-10-CM | POA: Diagnosis not present

## 2020-06-28 DIAGNOSIS — M1990 Unspecified osteoarthritis, unspecified site: Secondary | ICD-10-CM | POA: Diagnosis not present

## 2020-06-28 DIAGNOSIS — M6281 Muscle weakness (generalized): Secondary | ICD-10-CM | POA: Diagnosis not present

## 2020-06-28 DIAGNOSIS — I10 Essential (primary) hypertension: Secondary | ICD-10-CM | POA: Diagnosis not present

## 2020-06-28 DIAGNOSIS — M8580 Other specified disorders of bone density and structure, unspecified site: Secondary | ICD-10-CM | POA: Diagnosis not present

## 2020-06-30 DIAGNOSIS — M1990 Unspecified osteoarthritis, unspecified site: Secondary | ICD-10-CM | POA: Diagnosis not present

## 2020-06-30 DIAGNOSIS — M8580 Other specified disorders of bone density and structure, unspecified site: Secondary | ICD-10-CM | POA: Diagnosis not present

## 2020-06-30 DIAGNOSIS — R2681 Unsteadiness on feet: Secondary | ICD-10-CM | POA: Diagnosis not present

## 2020-06-30 DIAGNOSIS — E782 Mixed hyperlipidemia: Secondary | ICD-10-CM | POA: Diagnosis not present

## 2020-06-30 DIAGNOSIS — I10 Essential (primary) hypertension: Secondary | ICD-10-CM | POA: Diagnosis not present

## 2020-06-30 DIAGNOSIS — M6281 Muscle weakness (generalized): Secondary | ICD-10-CM | POA: Diagnosis not present

## 2020-07-03 DIAGNOSIS — M6281 Muscle weakness (generalized): Secondary | ICD-10-CM | POA: Diagnosis not present

## 2020-07-03 DIAGNOSIS — E782 Mixed hyperlipidemia: Secondary | ICD-10-CM | POA: Diagnosis not present

## 2020-07-03 DIAGNOSIS — M1990 Unspecified osteoarthritis, unspecified site: Secondary | ICD-10-CM | POA: Diagnosis not present

## 2020-07-03 DIAGNOSIS — R2681 Unsteadiness on feet: Secondary | ICD-10-CM | POA: Diagnosis not present

## 2020-07-03 DIAGNOSIS — M8580 Other specified disorders of bone density and structure, unspecified site: Secondary | ICD-10-CM | POA: Diagnosis not present

## 2020-07-03 DIAGNOSIS — I10 Essential (primary) hypertension: Secondary | ICD-10-CM | POA: Diagnosis not present

## 2020-07-05 DIAGNOSIS — R2681 Unsteadiness on feet: Secondary | ICD-10-CM | POA: Diagnosis not present

## 2020-07-05 DIAGNOSIS — I10 Essential (primary) hypertension: Secondary | ICD-10-CM | POA: Diagnosis not present

## 2020-07-05 DIAGNOSIS — M1990 Unspecified osteoarthritis, unspecified site: Secondary | ICD-10-CM | POA: Diagnosis not present

## 2020-07-05 DIAGNOSIS — M6281 Muscle weakness (generalized): Secondary | ICD-10-CM | POA: Diagnosis not present

## 2020-07-05 DIAGNOSIS — E782 Mixed hyperlipidemia: Secondary | ICD-10-CM | POA: Diagnosis not present

## 2020-07-05 DIAGNOSIS — M8580 Other specified disorders of bone density and structure, unspecified site: Secondary | ICD-10-CM | POA: Diagnosis not present

## 2020-07-05 DIAGNOSIS — H44519 Absolute glaucoma, unspecified eye: Secondary | ICD-10-CM | POA: Diagnosis not present

## 2020-07-07 DIAGNOSIS — M8580 Other specified disorders of bone density and structure, unspecified site: Secondary | ICD-10-CM | POA: Diagnosis not present

## 2020-07-07 DIAGNOSIS — R2681 Unsteadiness on feet: Secondary | ICD-10-CM | POA: Diagnosis not present

## 2020-07-07 DIAGNOSIS — E782 Mixed hyperlipidemia: Secondary | ICD-10-CM | POA: Diagnosis not present

## 2020-07-07 DIAGNOSIS — I10 Essential (primary) hypertension: Secondary | ICD-10-CM | POA: Diagnosis not present

## 2020-07-07 DIAGNOSIS — M6281 Muscle weakness (generalized): Secondary | ICD-10-CM | POA: Diagnosis not present

## 2020-07-07 DIAGNOSIS — M1990 Unspecified osteoarthritis, unspecified site: Secondary | ICD-10-CM | POA: Diagnosis not present

## 2020-07-10 DIAGNOSIS — M8580 Other specified disorders of bone density and structure, unspecified site: Secondary | ICD-10-CM | POA: Diagnosis not present

## 2020-07-10 DIAGNOSIS — M6281 Muscle weakness (generalized): Secondary | ICD-10-CM | POA: Diagnosis not present

## 2020-07-10 DIAGNOSIS — E782 Mixed hyperlipidemia: Secondary | ICD-10-CM | POA: Diagnosis not present

## 2020-07-10 DIAGNOSIS — I10 Essential (primary) hypertension: Secondary | ICD-10-CM | POA: Diagnosis not present

## 2020-07-10 DIAGNOSIS — R2681 Unsteadiness on feet: Secondary | ICD-10-CM | POA: Diagnosis not present

## 2020-07-10 DIAGNOSIS — M1990 Unspecified osteoarthritis, unspecified site: Secondary | ICD-10-CM | POA: Diagnosis not present

## 2020-07-12 DIAGNOSIS — M6281 Muscle weakness (generalized): Secondary | ICD-10-CM | POA: Diagnosis not present

## 2020-07-12 DIAGNOSIS — M1990 Unspecified osteoarthritis, unspecified site: Secondary | ICD-10-CM | POA: Diagnosis not present

## 2020-07-12 DIAGNOSIS — E782 Mixed hyperlipidemia: Secondary | ICD-10-CM | POA: Diagnosis not present

## 2020-07-12 DIAGNOSIS — R2681 Unsteadiness on feet: Secondary | ICD-10-CM | POA: Diagnosis not present

## 2020-07-12 DIAGNOSIS — I10 Essential (primary) hypertension: Secondary | ICD-10-CM | POA: Diagnosis not present

## 2020-07-12 DIAGNOSIS — M8580 Other specified disorders of bone density and structure, unspecified site: Secondary | ICD-10-CM | POA: Diagnosis not present

## 2020-07-14 DIAGNOSIS — E782 Mixed hyperlipidemia: Secondary | ICD-10-CM | POA: Diagnosis not present

## 2020-07-14 DIAGNOSIS — M8580 Other specified disorders of bone density and structure, unspecified site: Secondary | ICD-10-CM | POA: Diagnosis not present

## 2020-07-14 DIAGNOSIS — R2681 Unsteadiness on feet: Secondary | ICD-10-CM | POA: Diagnosis not present

## 2020-07-14 DIAGNOSIS — M1990 Unspecified osteoarthritis, unspecified site: Secondary | ICD-10-CM | POA: Diagnosis not present

## 2020-07-14 DIAGNOSIS — I10 Essential (primary) hypertension: Secondary | ICD-10-CM | POA: Diagnosis not present

## 2020-07-14 DIAGNOSIS — M6281 Muscle weakness (generalized): Secondary | ICD-10-CM | POA: Diagnosis not present

## 2020-07-17 DIAGNOSIS — R2681 Unsteadiness on feet: Secondary | ICD-10-CM | POA: Diagnosis not present

## 2020-07-17 DIAGNOSIS — M1990 Unspecified osteoarthritis, unspecified site: Secondary | ICD-10-CM | POA: Diagnosis not present

## 2020-07-17 DIAGNOSIS — M6281 Muscle weakness (generalized): Secondary | ICD-10-CM | POA: Diagnosis not present

## 2020-07-17 DIAGNOSIS — E782 Mixed hyperlipidemia: Secondary | ICD-10-CM | POA: Diagnosis not present

## 2020-07-17 DIAGNOSIS — M8580 Other specified disorders of bone density and structure, unspecified site: Secondary | ICD-10-CM | POA: Diagnosis not present

## 2020-07-17 DIAGNOSIS — I10 Essential (primary) hypertension: Secondary | ICD-10-CM | POA: Diagnosis not present

## 2020-07-19 DIAGNOSIS — J383 Other diseases of vocal cords: Secondary | ICD-10-CM | POA: Diagnosis not present

## 2020-07-19 DIAGNOSIS — J3802 Paralysis of vocal cords and larynx, bilateral: Secondary | ICD-10-CM | POA: Diagnosis not present

## 2020-07-19 DIAGNOSIS — J384 Edema of larynx: Secondary | ICD-10-CM | POA: Diagnosis not present

## 2020-07-20 DIAGNOSIS — M8580 Other specified disorders of bone density and structure, unspecified site: Secondary | ICD-10-CM | POA: Diagnosis not present

## 2020-07-20 DIAGNOSIS — M6281 Muscle weakness (generalized): Secondary | ICD-10-CM | POA: Diagnosis not present

## 2020-07-20 DIAGNOSIS — M1990 Unspecified osteoarthritis, unspecified site: Secondary | ICD-10-CM | POA: Diagnosis not present

## 2020-07-20 DIAGNOSIS — E782 Mixed hyperlipidemia: Secondary | ICD-10-CM | POA: Diagnosis not present

## 2020-07-20 DIAGNOSIS — I10 Essential (primary) hypertension: Secondary | ICD-10-CM | POA: Diagnosis not present

## 2020-07-20 DIAGNOSIS — R2681 Unsteadiness on feet: Secondary | ICD-10-CM | POA: Diagnosis not present

## 2020-07-21 DIAGNOSIS — R2681 Unsteadiness on feet: Secondary | ICD-10-CM | POA: Diagnosis not present

## 2020-07-21 DIAGNOSIS — M6281 Muscle weakness (generalized): Secondary | ICD-10-CM | POA: Diagnosis not present

## 2020-07-21 DIAGNOSIS — M8580 Other specified disorders of bone density and structure, unspecified site: Secondary | ICD-10-CM | POA: Diagnosis not present

## 2020-07-21 DIAGNOSIS — M1990 Unspecified osteoarthritis, unspecified site: Secondary | ICD-10-CM | POA: Diagnosis not present

## 2020-07-21 DIAGNOSIS — I10 Essential (primary) hypertension: Secondary | ICD-10-CM | POA: Diagnosis not present

## 2020-07-21 DIAGNOSIS — E782 Mixed hyperlipidemia: Secondary | ICD-10-CM | POA: Diagnosis not present

## 2020-07-22 ENCOUNTER — Other Ambulatory Visit: Payer: Self-pay | Admitting: Internal Medicine

## 2020-07-24 NOTE — Telephone Encounter (Signed)
Refill request pended and sent to Dr. Lyndel Safe for approval because of allergy warning

## 2020-08-16 DIAGNOSIS — Z23 Encounter for immunization: Secondary | ICD-10-CM | POA: Diagnosis not present

## 2020-09-06 DIAGNOSIS — L821 Other seborrheic keratosis: Secondary | ICD-10-CM | POA: Diagnosis not present

## 2020-09-06 DIAGNOSIS — L812 Freckles: Secondary | ICD-10-CM | POA: Diagnosis not present

## 2020-09-08 ENCOUNTER — Encounter: Payer: Self-pay | Admitting: Internal Medicine

## 2020-09-08 ENCOUNTER — Non-Acute Institutional Stay: Payer: Medicare Other | Admitting: Internal Medicine

## 2020-09-08 ENCOUNTER — Other Ambulatory Visit: Payer: Self-pay

## 2020-09-08 VITALS — BP 150/72 | HR 73 | Temp 97.5°F | Ht 64.0 in | Wt 120.8 lb

## 2020-09-08 DIAGNOSIS — E785 Hyperlipidemia, unspecified: Secondary | ICD-10-CM

## 2020-09-08 DIAGNOSIS — R634 Abnormal weight loss: Secondary | ICD-10-CM | POA: Diagnosis not present

## 2020-09-08 DIAGNOSIS — I10 Essential (primary) hypertension: Secondary | ICD-10-CM

## 2020-09-08 DIAGNOSIS — F418 Other specified anxiety disorders: Secondary | ICD-10-CM

## 2020-09-08 DIAGNOSIS — H401130 Primary open-angle glaucoma, bilateral, stage unspecified: Secondary | ICD-10-CM

## 2020-09-08 DIAGNOSIS — R2681 Unsteadiness on feet: Secondary | ICD-10-CM

## 2020-09-08 DIAGNOSIS — Z8709 Personal history of other diseases of the respiratory system: Secondary | ICD-10-CM

## 2020-09-08 DIAGNOSIS — M810 Age-related osteoporosis without current pathological fracture: Secondary | ICD-10-CM

## 2020-09-08 DIAGNOSIS — K219 Gastro-esophageal reflux disease without esophagitis: Secondary | ICD-10-CM

## 2020-09-08 NOTE — Patient Instructions (Addendum)
Bring your medications to your next visit.  Continue having your blood pressure checked weekly.

## 2020-09-08 NOTE — Progress Notes (Signed)
Location:  [place] of Service:  Clinic (12)  Provider:   Code Status:  Goals of Care:  Advanced Directives 08/06/2019  Does Patient Have a Medical Advance Directive? No;Yes  Type of Advance Directive -  Does patient want to make changes to medical advance directive? -  Copy of [place] in Chart? -  Would patient like information on creating a medical advance directive? -  Pre-existing out of facility DNR order (yellow form or pink MOST form) -     Chief Complaint  Patient presents with  . Medical Management of Chronic Issues    Patient returns to the clinic to follow up on her weight.     HPI: Patient is a 86 y.o. female seen today for medical management of chronic diseases.   Patient has a history of weight loss, corneal ulcer of left eye with glaucoma, hypertension, unstable gait, osteoporosis, vocal cord paralysis s/p cordotomy in 2003, hearing loss, HLD and depression  Patient's weight has stabilized.  She is on Remeron.  She follows with ophthalmology for her corneal ulcer and states her vision is better.  Is able to stay independent. Worked with therapy and now has the walker.  Gait more stable no recent falls No other complaints today doing well. Past Medical History:  Diagnosis Date  . Anemia   . Anxiety   . Apnea 05/2000   sleep study revealed mild upper airway obstruction & apnea during speel, no set recommendations for therapy  . Asthma   . Diverticulosis   . Dyspnea 12/15/09   spirometer normal & function actually worsens with nebk may have more of a "exercise-induced" type of bronchospasm   . Fatigue 07/2015  . GERD (gastroesophageal reflux disease)   . Glaucoma    both eyes  . Hyperlipidemia   . Hypertension   . Laryngeal paresis 09/12/2016  . Loss of weight 06/20/2016  . OSA (obstructive sleep apnea)   . Osteoporosis, senile   . Vocal cord dysfunction    per ENT, pt NOT to have elective intubation without  disscussing with him    Past Surgical History:  Procedure Laterality Date  . CATARACT EXTRACTION W/ INTRAOCULAR LENS  IMPLANT, BILATERAL  2011   Dr. Charise Killian  . CERVICAL CONIZATION W/BX     due to cervical dysplasia  . COLONOSCOPY  02/2002   Diverticublosis   . EYE SURGERY Right 02/2016   laser surgery for scar tissure  . nasolaryngoscopy  2001   reflux laryngitis  . SKIN CANCER EXCISION     ?BCC  . TONSILLECTOMY    . UPPER GI ENDOSCOPY  05/2000   upper airway edema/changes that were consistent with GERD  . vocal fold cordotomy Left 2003   Dr. Denyse Dago [facility]    Allergies  Allergen Reactions  . Brimonidine Other (See Comments)  . Dorzolamide Hcl Itching    EYE PAIN    Outpatient Encounter Medications as of 09/08/2020  Medication Sig  . aspirin EC 81 MG tablet Take 81 mg by mouth daily.  . brinzolamide (AZOPT) 1 % ophthalmic suspension Place 1 drop into both eyes 3 (three) times daily.   . Calcium-Vitamin D-Vitamin K (CALCIUM SOFT CHEWS PO) Take by mouth.  . furosemide (LASIX) 20 MG tablet TAKE 1/2 TABLET BY MOUTH EVERY DAY  . ketorolac (ACULAR) 0.5 % ophthalmic solution Place 1 drop into the left eye in the morning and at bedtime.   Marland Kitchen losartan (COZAAR) 25 MG  tablet Take 25 mg by mouth in the morning and at bedtime.  Marland Kitchen loteprednol (LOTEMAX) 0.2 % SUSP Place 1 drop into the left eye 6 (six) times daily. Left eye   . mirtazapine (REMERON) 30 MG tablet TAKE 1 TABLET BY MOUTH EVERYDAY AT BEDTIME  . Netarsudil Dimesylate (RHOPRESSA) 0.02 % SOLN Apply 1 drop to eye at bedtime. Both eyes  . Omega-3 Fatty Acids (FISH OIL) 1000 MG CAPS Take 1 capsule by mouth daily.   . simvastatin (ZOCOR) 10 MG tablet TAKE ONE TABLET BY MOUTH AT BEDTIME TO CONTROL CHOLESTEROL  . Timolol Maleate 0.5 % (DAILY) SOLN Place 1 drop into both eyes 2 (two) times daily.   . vitamin C (ASCORBIC ACID) 500 MG tablet Take 500 mg by mouth daily.  . Vitamin D, Cholecalciferol, 1000 UNITS CAPS Take 1 capsule by  mouth 2 (two) times daily.   Marland Kitchen VYZULTA 0.024 % SOLN Place 1 drop into both eyes at bedtime.   . [DISCONTINUED] losartan (COZAAR) 25 MG tablet TAKE TWO TABLETS (50MG ) BY MOUTH ONCE DAILY FOR BLOOD PRESSURE (Patient taking differently: Take one tablets (25 mg ) by mouth twice daily for blood pressure)  . [DISCONTINUED] Coenzyme Q10 (COQ10) 200 MG CAPS Take 1 capsule by mouth daily.    No facility-administered encounter medications on file as of 09/08/2020.    Review of Systems:  Review of Systems  Review of Systems  Constitutional: Negative for activity change, appetite change, chills, diaphoresis, fatigue and fever.  HENT: Negative for mouth sores, postnasal drip, rhinorrhea, sinus pain and sore throat.   Respiratory: Negative for apnea, cough, chest tightness, shortness of breath and wheezing.   Cardiovascular: Negative for chest pain, palpitations and leg swelling.  Gastrointestinal: Negative for abdominal distention, abdominal pain, constipation, diarrhea, nausea and vomiting.  Genitourinary: Negative for dysuria and frequency.  Musculoskeletal: Negative for arthralgias, joint swelling and myalgias.  Skin: Negative for rash.  Neurological: Negative for dizziness, syncope, weakness, light-headedness and numbness.  Psychiatric/Behavioral: Negative for behavioral problems, confusion and sleep disturbance.     Health Maintenance  Topic Date Due  . TETANUS/TDAP  08/07/2025  . INFLUENZA VACCINE  Completed  . DEXA SCAN  Completed  . COVID-19 Vaccine  Completed  . PNA vac Low Risk Adult  Completed    Physical Exam: Vitals:   09/08/20 0915  BP: (!) 150/72  Pulse: 73  Temp: (!) 97.5 F (36.4 C)  SpO2: 95%  Weight: 120 lb 12.8 oz (54.8 kg)  Height: 5\' 4"  (1.626 m)   Body mass index is 20.74 kg/m. Physical Exam  Constitutional: Oriented to person, place, and time. Well-developed and well-nourished.  HENT:  Head: Normocephalic.  Mouth/Throat: Oropharynx is clear and moist.    Eyes: Pupils are equal, round, and reactive to light.  Neck: Neck supple.  Cardiovascular: Normal rate and normal heart sounds.  No murmur heard. Pulmonary/Chest: Effort normal and breath sounds normal. No respiratory distress. Some Expiratory wheezing due to her Vocal Cords  She has no rales.  Abdominal: Soft. Bowel sounds are normal. No distension. There is no tenderness. There is no rebound.  Musculoskeletal: No edema.  Lymphadenopathy: none Neurological: Alert and oriented to person, place, and time. No Focal deficit Has difficulty in Finding right words Skin: Skin is warm and dry.  Psychiatric: Normal mood and affect. Behavior is normal. Thought content normal.    Labs reviewed: Basic Metabolic Panel: Recent Labs    12/17/19 0843 05/17/20 0700  NA 143 140  K 4.2  4.6  CL 102 104  CO2 34* 34*  GLUCOSE 98 93  BUN 24 31*  CREATININE 0.95* 0.99*  CALCIUM 9.6 9.7  TSH 1.58  --    Liver Function Tests: Recent Labs    12/17/19 0843 05/17/20 0700  AST 21 22  ALT 12 13  BILITOT 0.4 0.4  PROT 6.4 6.6   No results for input(s): LIPASE, AMYLASE in the last 8760 hours. No results for input(s): AMMONIA in the last 8760 hours. CBC: Recent Labs    12/17/19 0843 05/17/20 0700  WBC 5.0 5.7  NEUTROABS 3,165 3,067  HGB 13.1 12.9  HCT 39.5 39.3  MCV 94.5 94.0  PLT 223 207   Lipid Panel: Recent Labs    12/17/19 0843  CHOL 170  HDL 61  LDLCALC 88  TRIG 115  CHOLHDL 2.8   Lab Results  Component Value Date   HGBA1C 5.6 05/17/2020    Procedures since last visit: No results found.  Assessment/Plan Essential hypertension Mildily elevated No change yet Continue to watch BP at home readings around 140-150  Depression with anxiety Doing well with Remeron  Hyperlipidemia, unspecified hyperlipidemia type On statin LDL Less then 100  Weight loss On Remeron Has stabilized  Osteoporosis, postmenopausal T score was -4.3 Does not want Prolia Wants to  continue Exercise and Vit D  Unsteady gait Did well after therapy Walking with the walker  H/O vocal cord paralysis Does not follow with ENT any more  Gastroesophageal reflux disease without esophagitis On Prilosec  Primary open angle glaucoma of both eyes, unspecified glaucoma stage Follows with Opthalmologist    Labs/tests ordered:  * No order type specified * Next appt:  11/30/2020

## 2020-09-12 DIAGNOSIS — Z23 Encounter for immunization: Secondary | ICD-10-CM | POA: Diagnosis not present

## 2020-09-17 ENCOUNTER — Other Ambulatory Visit: Payer: Self-pay | Admitting: Internal Medicine

## 2020-09-17 DIAGNOSIS — E785 Hyperlipidemia, unspecified: Secondary | ICD-10-CM

## 2020-10-11 DIAGNOSIS — H35352 Cystoid macular degeneration, left eye: Secondary | ICD-10-CM | POA: Diagnosis not present

## 2020-10-11 DIAGNOSIS — H35372 Puckering of macula, left eye: Secondary | ICD-10-CM | POA: Diagnosis not present

## 2020-10-15 ENCOUNTER — Other Ambulatory Visit: Payer: Self-pay | Admitting: Internal Medicine

## 2020-10-18 DIAGNOSIS — H401122 Primary open-angle glaucoma, left eye, moderate stage: Secondary | ICD-10-CM | POA: Diagnosis not present

## 2020-10-18 DIAGNOSIS — H401113 Primary open-angle glaucoma, right eye, severe stage: Secondary | ICD-10-CM | POA: Diagnosis not present

## 2020-10-18 DIAGNOSIS — Z961 Presence of intraocular lens: Secondary | ICD-10-CM | POA: Diagnosis not present

## 2020-11-28 ENCOUNTER — Other Ambulatory Visit: Payer: Self-pay | Admitting: Internal Medicine

## 2020-11-29 DIAGNOSIS — R634 Abnormal weight loss: Secondary | ICD-10-CM | POA: Diagnosis not present

## 2020-11-29 DIAGNOSIS — E785 Hyperlipidemia, unspecified: Secondary | ICD-10-CM | POA: Diagnosis not present

## 2020-11-29 DIAGNOSIS — I10 Essential (primary) hypertension: Secondary | ICD-10-CM | POA: Diagnosis not present

## 2020-11-30 ENCOUNTER — Other Ambulatory Visit: Payer: Self-pay

## 2020-11-30 DIAGNOSIS — E785 Hyperlipidemia, unspecified: Secondary | ICD-10-CM

## 2020-11-30 DIAGNOSIS — R634 Abnormal weight loss: Secondary | ICD-10-CM

## 2020-11-30 DIAGNOSIS — I10 Essential (primary) hypertension: Secondary | ICD-10-CM

## 2020-12-01 LAB — COMPLETE METABOLIC PANEL WITH GFR
AG Ratio: 2.1 (calc) (ref 1.0–2.5)
ALT: 8 U/L (ref 6–29)
AST: 21 U/L (ref 10–35)
Albumin: 4.2 g/dL (ref 3.6–5.1)
Alkaline phosphatase (APISO): 87 U/L (ref 37–153)
BUN/Creatinine Ratio: 20 (calc) (ref 6–22)
BUN: 19 mg/dL (ref 7–25)
CO2: 31 mmol/L (ref 20–32)
Calcium: 9.5 mg/dL (ref 8.6–10.4)
Chloride: 105 mmol/L (ref 98–110)
Creat: 0.96 mg/dL — ABNORMAL HIGH (ref 0.60–0.88)
GFR, Est African American: 60 mL/min/{1.73_m2} (ref >=60)
GFR, Est Non African American: 52 mL/min/{1.73_m2} — ABNORMAL LOW (ref >=60)
Globulin: 2 g/dL (calc) (ref 1.9–3.7)
Glucose, Bld: 85 mg/dL (ref 65–99)
Potassium: 4.8 mmol/L (ref 3.5–5.3)
Sodium: 145 mmol/L (ref 135–146)
Total Bilirubin: 0.5 mg/dL (ref 0.2–1.2)
Total Protein: 6.2 g/dL (ref 6.1–8.1)

## 2020-12-01 LAB — CBC WITH DIFFERENTIAL/PLATELET
Absolute Monocytes: 533 cells/uL (ref 200–950)
Basophils Absolute: 38 cells/uL (ref 0–200)
Basophils Relative: 0.8 %
Eosinophils Absolute: 202 cells/uL (ref 15–500)
Eosinophils Relative: 4.2 %
HCT: 38.4 % (ref 35.0–45.0)
Hemoglobin: 12.5 g/dL (ref 11.7–15.5)
Lymphs Abs: 1243 cells/uL (ref 850–3900)
MCH: 30.6 pg (ref 27.0–33.0)
MCHC: 32.6 g/dL (ref 32.0–36.0)
MCV: 93.9 fL (ref 80.0–100.0)
MPV: 11.9 fL (ref 7.5–12.5)
Monocytes Relative: 11.1 %
Neutro Abs: 2784 cells/uL (ref 1500–7800)
Neutrophils Relative %: 58 %
Platelets: 172 10*3/uL (ref 140–400)
RBC: 4.09 10*6/uL (ref 3.80–5.10)
RDW: 12.5 % (ref 11.0–15.0)
Total Lymphocyte: 25.9 %
WBC: 4.8 10*3/uL (ref 3.8–10.8)

## 2020-12-01 LAB — LIPID PANEL
Cholesterol: 172 mg/dL (ref <200)
HDL: 64 mg/dL (ref >=50)
LDL Cholesterol (Calc): 87 mg/dL (calc)
Non-HDL Cholesterol (Calc): 108 mg/dL (calc) (ref <130)
Total CHOL/HDL Ratio: 2.7 (calc) (ref <5.0)
Triglycerides: 112 mg/dL (ref <150)

## 2020-12-01 LAB — TSH: TSH: 1.82 mIU/L (ref 0.40–4.50)

## 2020-12-07 ENCOUNTER — Encounter: Payer: Self-pay | Admitting: Nurse Practitioner

## 2020-12-07 ENCOUNTER — Other Ambulatory Visit: Payer: Self-pay

## 2020-12-07 ENCOUNTER — Non-Acute Institutional Stay: Payer: Medicare Other | Admitting: Nurse Practitioner

## 2020-12-07 DIAGNOSIS — I10 Essential (primary) hypertension: Secondary | ICD-10-CM | POA: Diagnosis not present

## 2020-12-07 DIAGNOSIS — Z8709 Personal history of other diseases of the respiratory system: Secondary | ICD-10-CM | POA: Diagnosis not present

## 2020-12-07 DIAGNOSIS — F418 Other specified anxiety disorders: Secondary | ICD-10-CM

## 2020-12-07 DIAGNOSIS — E785 Hyperlipidemia, unspecified: Secondary | ICD-10-CM | POA: Diagnosis not present

## 2020-12-07 DIAGNOSIS — M810 Age-related osteoporosis without current pathological fracture: Secondary | ICD-10-CM

## 2020-12-07 DIAGNOSIS — K219 Gastro-esophageal reflux disease without esophagitis: Secondary | ICD-10-CM | POA: Diagnosis not present

## 2020-12-07 NOTE — Assessment & Plan Note (Signed)
GERD, takes Omeprazole, Hgb 12.5 11/29/20

## 2020-12-07 NOTE — Assessment & Plan Note (Addendum)
HTN, takes Losartan, ASA, Furosemide 10mg . Bun/creat 19/0.96 11/29/20. Occasionally SBp in 150-160s, DBp<90 consistently.

## 2020-12-07 NOTE — Assessment & Plan Note (Signed)
OP DEXA T score -4.3, declined Prolia, takes Vit D, Vit D level 40 05/17/20

## 2020-12-07 NOTE — Progress Notes (Signed)
Location:   clinic [place]   Place of Service:  Clinic (12) Provider: Marlana Latus NP  Code Status: DNR Goals of Care: [place] 08/06/2019  Does Patient Have a Medical Advance Directive? No;Yes  Type of Advance Directive -  Does patient want to make changes to medical advance directive? -  Copy of [place] in Chart? -  Would patient like information on creating a medical advance directive? -  Pre-existing out of facility DNR order (yellow form or pink MOST form) -     Chief Complaint  Patient presents with  . Medical Management of Chronic Issues    Patient returns to the office for follow up.     HPI: Patient is a 87 y.o. female seen today for medical management of chronic diseases.     HTN, takes Losartan, ASA, Furosemide 10mg . Bun/creat 19/0.96 11/29/20             Depression, takes Mirtazapine, TSH 1.82 11/29/20             Hyperlipidemia, takes Omega 3, Simvastatin. LDL 87 11/29/20  OP DEXA T score -4.3, declined Prolia, takes Vit D, Vit D level 40 05/17/20  Vocal cord paralysis, underwent ENT evaluation in the past.   GERD, takes Omeprazole, Hgb 12.5 11/29/20 Past Medical History:  Diagnosis Date  . Anemia   . Anxiety   . Apnea 05/2000   sleep study revealed mild upper airway obstruction & apnea during speel, no set recommendations for therapy  . Asthma   . Diverticulosis   . Dyspnea 12/15/09   spirometer normal & function actually worsens with nebk may have more of a "exercise-induced" type of bronchospasm   . Fatigue 07/2015  . GERD (gastroesophageal reflux disease)   . Glaucoma    both eyes  . Hyperlipidemia   . Hypertension   . Laryngeal paresis 09/12/2016  . Loss of weight 06/20/2016  . OSA (obstructive sleep apnea)   . Osteoporosis, senile   . Vocal cord dysfunction    per ENT, pt NOT to have elective intubation without disscussing with him    Past Surgical History:  Procedure Laterality Date  . CATARACT EXTRACTION W/  INTRAOCULAR LENS  IMPLANT, BILATERAL  2011   Dr. Charise Killian  . CERVICAL CONIZATION W/BX     due to cervical dysplasia  . COLONOSCOPY  02/2002   Diverticublosis   . EYE SURGERY Right 02/2016   laser surgery for scar tissure  . nasolaryngoscopy  2001   reflux laryngitis  . SKIN CANCER EXCISION     ?BCC  . TONSILLECTOMY    . UPPER GI ENDOSCOPY  05/2000   upper airway edema/changes that were consistent with GERD  . vocal fold cordotomy Left 2003   Dr. Denyse Dago [facility]    Allergies  Allergen Reactions  . Brimonidine Other (See Comments)  . Dorzolamide Hcl Itching    EYE PAIN    Allergies as of 12/07/2020      Reactions   Brimonidine Other (See Comments)   Dorzolamide Hcl Itching   EYE PAIN      Medication List       Accurate as of December 07, 2020 11:59 PM. If you have any questions, ask your nurse or doctor.        aspirin EC 81 MG tablet Take 81 mg by mouth daily.   brinzolamide 1 % ophthalmic suspension Commonly known as: AZOPT Place 1 drop into both eyes 3 (three) times daily.  CALCIUM SOFT CHEWS PO Take by mouth.   Fish Oil 1000 MG Caps Take 1 capsule by mouth daily.   furosemide 20 MG tablet Commonly known as: LASIX Take 0.5 tablets (10 mg total) by mouth daily.   ketorolac 0.5 % ophthalmic solution Commonly known as: ACULAR Place 1 drop into the left eye in the morning and at bedtime.   losartan 25 MG tablet Commonly known as: COZAAR TAKE TWO TABLETS (50MG ) BY MOUTH ONCE DAILY FOR BLOOD PRESSURE   loteprednol 0.2 % Susp Commonly known as: LOTEMAX Place 1 drop into the left eye 6 (six) times daily. Left eye   mirtazapine 30 MG tablet Commonly known as: REMERON TAKE 1 TABLET BY MOUTH EVERYDAY AT BEDTIME   Netarsudil Dimesylate 0.02 % Soln Apply 1 drop to eye at bedtime. Both eyes   simvastatin 10 MG tablet Commonly known as: ZOCOR TAKE ONE TABLET BY MOUTH AT BEDTIME TO CONTROL CHOLESTEROL   Timolol Maleate (Once-Daily) 0.5 % Soln Place 1  drop into both eyes 2 (two) times daily.   vitamin C 500 MG tablet Commonly known as: ASCORBIC ACID Take 500 mg by mouth daily.   Vitamin D (Cholecalciferol) 25 MCG (1000 UT) Caps Take 1 capsule by mouth 2 (two) times daily.   Vyzulta 0.024 % Soln Generic drug: Latanoprostene Bunod Place 1 drop into both eyes at bedtime.       Review of Systems:  Review of Systems  Constitutional: Positive for unexpected weight change. Negative for appetite change, fatigue and fever.       Gradual weight loss  HENT: Positive for hearing loss and voice change. Negative for congestion.        Vocal cord paralysis.   Respiratory: Negative for cough, shortness of breath and wheezing.   Cardiovascular: Negative for leg swelling.  Gastrointestinal: Negative for abdominal pain, constipation, nausea and vomiting.  Genitourinary: Negative for dysuria and urgency.  Musculoskeletal: Positive for arthralgias and gait problem.  Skin: Negative for color change.  Neurological: Negative for speech difficulty, weakness and headaches.       Memory lapses.   Psychiatric/Behavioral: Negative for behavioral problems, hallucinations and sleep disturbance. The patient is not nervous/anxious.     Health Maintenance  Topic Date Due  . TETANUS/TDAP  08/07/2025  . INFLUENZA VACCINE  Completed  . DEXA SCAN  Completed  . COVID-19 Vaccine  Completed  . PNA vac Low Risk Adult  Completed    Physical Exam: Vitals:   12/07/20 1458  BP: (!) 152/80  Pulse: 90  Temp: (!) 97.2 F (36.2 C)  SpO2: 97%  Weight: 114 lb 3.2 oz (51.8 kg)  Height: 5\' 4"  (1.626 m)   Body mass index is 19.6 kg/m. Physical Exam Vitals reviewed.  Constitutional:      Appearance: Normal appearance.  HENT:     Head: Normocephalic and atraumatic.     Ears:     Comments: No ear wax    Nose: Nose normal.     Mouth/Throat:     Mouth: Mucous membranes are moist.     Comments: Torus plantinus Eyes:     Extraocular Movements: Extraocular  movements intact.     Conjunctiva/sclera: Conjunctivae normal.     Pupils: Pupils are equal, round, and reactive to light.     Comments: Left lower eyelid entropion, Hx of left corneal ulceration, R+L glaucoma, able to read  Cardiovascular:     Rate and Rhythm: Normal rate and regular rhythm.     Heart sounds:  No murmur heard.   Pulmonary:     Effort: Pulmonary effort is normal.     Breath sounds: No wheezing or rales.     Comments: Anterior neck, sternal region wheezes, disappearing when breathing with mouth closed.   Abdominal:     Palpations: Abdomen is soft.     Tenderness: There is no abdominal tenderness.  Musculoskeletal:     Cervical back: Normal range of motion and neck supple.     Right lower leg: No edema.     Left lower leg: No edema.  Skin:    General: Skin is warm and dry.  Neurological:     General: No focal deficit present.     Mental Status: She is alert. Mental status is at baseline.     Motor: No weakness.     Gait: Gait normal.     Comments: Oriented to person and place. Able to walk  Psychiatric:        Mood and Affect: Mood normal.        Behavior: Behavior normal.        Thought Content: Thought content normal.        Judgment: Judgment normal.     Labs reviewed: Basic Metabolic Panel: Recent Labs    12/17/19 0843 05/17/20 0700 11/29/20 1044  NA 143 140 145  K 4.2 4.6 4.8  CL 102 104 105  CO2 34* 34* 31  GLUCOSE 98 93 85  BUN 24 31* 19  CREATININE 0.95* 0.99* 0.96*  CALCIUM 9.6 9.7 9.5  TSH 1.58  --  1.82   Liver Function Tests: Recent Labs    12/17/19 0843 05/17/20 0700 11/29/20 1044  AST 21 22 21   ALT 12 13 8   BILITOT 0.4 0.4 0.5  PROT 6.4 6.6 6.2   No results for input(s): LIPASE, AMYLASE in the last 8760 hours. No results for input(s): AMMONIA in the last 8760 hours. CBC: Recent Labs    12/17/19 0843 05/17/20 0700 11/29/20 1044  WBC 5.0 5.7 4.8  NEUTROABS 3,165 3,067 2,784  HGB 13.1 12.9 12.5  HCT 39.5 39.3 38.4   MCV 94.5 94.0 93.9  PLT 223 207 172   Lipid Panel: Recent Labs    12/17/19 0843 11/29/20 1044  CHOL 170 172  HDL 61 64  LDLCALC 88 87  TRIG 115 112  CHOLHDL 2.8 2.7   Lab Results  Component Value Date   HGBA1C 5.6 05/17/2020    Procedures since last visit: No results found.  Assessment/Plan  HTN (hypertension) HTN, takes Losartan, ASA, Furosemide 10mg . Bun/creat 19/0.96 11/29/20. Occasionally SBp in 150-160s, DBp<90 consistently.    Depression with anxiety Depression, takes Mirtazapine, TSH 1.82 11/29/20   Hyperlipidemia Hyperlipidemia, takes Omega 3, Simvastatin. LDL 87 11/29/20   Osteoporosis OP DEXA T score -4.3, declined Prolia, takes Vit D, Vit D level 40 05/17/20  H/O vocal cord paralysis Vocal cord paralysis, underwent ENT evaluation in the past.    GERD (gastroesophageal reflux disease) GERD, takes Omeprazole, Hgb 12.5 11/29/20    Labs/tests ordered:  None  Next appt:  4 months

## 2020-12-07 NOTE — Assessment & Plan Note (Signed)
Hyperlipidemia, takes Omega 3, Simvastatin. LDL 87 11/29/20

## 2020-12-07 NOTE — Assessment & Plan Note (Signed)
Vocal cord paralysis, underwent ENT evaluation in the past.  °

## 2020-12-07 NOTE — Assessment & Plan Note (Signed)
Depression, takes Mirtazapine, TSH 1.82 11/29/20

## 2020-12-08 ENCOUNTER — Encounter: Payer: Self-pay | Admitting: Nurse Practitioner

## 2020-12-08 MED ORDER — MIRTAZAPINE 30 MG PO TABS: ORAL_TABLET | ORAL | 1 refills | Status: DC

## 2020-12-08 MED ORDER — FUROSEMIDE 20 MG PO TABS: 10.0000 mg | ORAL_TABLET | Freq: Every day | ORAL | 1 refills | Status: DC

## 2020-12-10 ENCOUNTER — Other Ambulatory Visit: Payer: Self-pay | Admitting: Internal Medicine

## 2020-12-10 DIAGNOSIS — E785 Hyperlipidemia, unspecified: Secondary | ICD-10-CM

## 2021-01-25 ENCOUNTER — Non-Acute Institutional Stay: Payer: Medicare Other | Admitting: Nurse Practitioner

## 2021-01-25 ENCOUNTER — Other Ambulatory Visit: Payer: Self-pay

## 2021-01-25 ENCOUNTER — Encounter: Payer: Self-pay | Admitting: Nurse Practitioner

## 2021-01-25 DIAGNOSIS — M818 Other osteoporosis without current pathological fracture: Secondary | ICD-10-CM | POA: Diagnosis not present

## 2021-01-25 DIAGNOSIS — I10 Essential (primary) hypertension: Secondary | ICD-10-CM | POA: Diagnosis not present

## 2021-01-25 DIAGNOSIS — E785 Hyperlipidemia, unspecified: Secondary | ICD-10-CM | POA: Diagnosis not present

## 2021-01-25 DIAGNOSIS — F418 Other specified anxiety disorders: Secondary | ICD-10-CM | POA: Diagnosis not present

## 2021-01-25 DIAGNOSIS — Z8709 Personal history of other diseases of the respiratory system: Secondary | ICD-10-CM | POA: Diagnosis not present

## 2021-01-25 DIAGNOSIS — K219 Gastro-esophageal reflux disease without esophagitis: Secondary | ICD-10-CM

## 2021-01-25 NOTE — Assessment & Plan Note (Signed)
takes Mirtazapine, TSH 1.82 11/29/20

## 2021-01-25 NOTE — Progress Notes (Signed)
Location:   clinic [place]   Place of Service:  Clinic (12) Provider: Marlana Latus NP  Code Status: DNR Goals of Care[place] 08[place]06/2019  Does Patient Have a Medical Advance Directive? No;Yes  Type of Advance Directive -  Does patient want to make changes to medical advance directive? -  Copy of [place] in Chart? -  Would patient like information on creating a medical advance directive? -  Pre-existing out of facility DNR order (yellow form or pink MOST form) -     Chief Complaint  Patient presents with  . Medical Management of Chronic Issues    Patient returns to the clinic for follow up.    HPI: Patient is a 87 y.o. female seen today for medical management of chronic diseases.    HTN, takes Losartan, ASA,Furosemide79m. Bun/creat 19/0.96 11/29/20 Depression, takes Mirtazapine, TSH 1.82 11/29/20 Hyperlipidemia, takes Omega 3, Simvastatin. LDL 87 11/29/20             OP DEXA T score -4.3, declined Prolia, takes Vit D, Vit D level 40 05/17/20,  off Alendronate 2nd to jaw bone necrotic process             Vocal cord paralysis, underwent ENT evaluation in the past.              GERD, takes Omeprazole, Hgb 12.5 11/29/20  Past Medical History:  Diagnosis Date  . Anemia   . Anxiety   . Apnea 05/2000   sleep study revealed mild upper airway obstruction & apnea during speel, no set recommendations for therapy  . Asthma   . Diverticulosis   . Dyspnea 12/15/09   spirometer normal & function actually worsens with nebk may have more of a "exercise-induced" type of bronchospasm   . Fatigue 07/2015  . GERD (gastroesophageal reflux disease)   . Glaucoma    both eyes  . Hyperlipidemia   . Hypertension   . Laryngeal paresis 09/12/2016  . Loss of weight 06/20/2016  . OSA (obstructive sleep apnea)   . Osteoporosis, senile   . Vocal cord dysfunction    per ENT, pt NOT to have elective intubation without disscussing with him    Past  Surgical History:  Procedure Laterality Date  . CATARACT EXTRACTION W/ INTRAOCULAR LENS  IMPLANT, BILATERAL  2011   Dr. ECharise Killian . CERVICAL CONIZATION W/BX     due to cervical dysplasia  . COLONOSCOPY  02/2002   Diverticublosis   . EYE SURGERY Right 02/2016   laser surgery for scar tissure  . nasolaryngoscopy  2001   reflux laryngitis  . SKIN CANCER EXCISION     ?BCC  . TONSILLECTOMY    . UPPER GI ENDOSCOPY  05/2000   upper airway edema/changes that were consistent with GERD  . vocal fold cordotomy Left 2003   [facility]   Allergies  Allergen Reactions  . Brimonidine Other (See Comments)  . Dorzolamide Hcl Itching    EYE PAIN    Allergies as of 01/25/2021      Reactions   Brimonidine Other (See Comments)   Dorzolamide Hcl Itching   EYE PAIN      Medication List       Accurate as of January 25, 2021 11:59 PM. If you have any questions, ask your nurse or doctor.        STOP taking these medications   Vitamin D (Cholecalciferol) 25 MCG (1000 UT) Caps Stopped by: Ziyad Dyar X  Migdalia Olejniczak, NP     TAKE these medications   aspirin EC 81 MG tablet Take 81 mg by mouth daily.   brinzolamide 1 % ophthalmic suspension Commonly known as: AZOPT Place 1 drop into both eyes 3 (three) times daily.   CALCIUM SOFT CHEWS PO Take by mouth.   Fish Oil 1000 MG Caps Take 1 capsule by mouth daily.   furosemide 20 MG tablet Commonly known as: LASIX Take 0.5 tablets (10 mg total) by mouth daily.   ketorolac 0.5 % ophthalmic solution Commonly known as: ACULAR Place 1 drop into the left eye in the morning and at bedtime.   losartan 25 MG tablet Commonly known as: COZAAR TAKE TWO TABLETS (50MG) BY MOUTH ONCE DAILY FOR BLOOD PRESSURE   loteprednol 0.2 % Susp Commonly known as: LOTEMAX Place 1 drop into the left eye 6 (six) times daily. Left eye   mirtazapine 30 MG tablet Commonly known as: REMERON TAKE 1 TABLET BY MOUTH EVERYDAY AT BEDTIME   Netarsudil Dimesylate 0.02 %  Soln Apply 1 drop to eye at bedtime. Both eyes   simvastatin 10 MG tablet Commonly known as: ZOCOR TAKE ONE TABLET BY MOUTH AT BEDTIME TO CONTROL CHOLESTEROL   Timolol Maleate (Once-Daily) 0.5 % Soln Place 1 drop into both eyes 2 (two) times daily.   vitamin C 500 MG tablet Commonly known as: ASCORBIC ACID Take 500 mg by mouth daily.   Vyzulta 0.024 % Soln Generic drug: Latanoprostene Bunod Place 1 drop into both eyes at bedtime.       Review of Systems:  Review of Systems  Constitutional: Negative for fatigue, fever and unexpected weight change.  HENT: Positive for hearing loss and voice change. Negative for congestion.        Vocal cord paralysis.   Respiratory: Negative for shortness of breath and wheezing.   Cardiovascular: Negative for leg swelling.  Gastrointestinal: Negative for abdominal pain and constipation.  Genitourinary: Negative for dysuria and urgency.  Musculoskeletal: Positive for arthralgias and gait problem.  Skin: Negative for color change.  Neurological: Negative for speech difficulty, weakness and light-headedness.       Memory lapses.   Psychiatric/Behavioral: Negative for behavioral problems and sleep disturbance. The patient is not nervous/anxious.     Health Maintenance  Topic Date Due  . TETANUS/TDAP  08/07/2025  . INFLUENZA VACCINE  Completed  . DEXA SCAN  Completed  . COVID-19 Vaccine  Completed  . PNA vac Low Risk Adult  Completed  . HPV VACCINES  Aged Out    Physical Exam: Vitals:   01/25/21 1406  BP: 120/78  Pulse: 76  Temp: (!) 96 F (35.6 C)  SpO2: 96%  Weight: 116 lb 6.4 oz (52.8 kg)  Height: _0  (1.626 m)   Body mass index is 19.98 kg/m. Physical Exam Vitals reviewed.  Constitutional:      Appearance: Normal appearance.  HENT:     Head: Normocephalic and atraumatic.     Nose: Nose normal.     Mouth/Throat:     Mouth: Mucous membranes are moist.     Comments: Torus plantinus Eyes:     Extraocular Movements:  Extraocular movements intact.     Conjunctiva/sclera: Conjunctivae normal.     Pupils: Pupils are equal, round, and reactive to light.     Comments: Left lower eyelid entropion, Hx of left corneal ulceration, R+L glaucoma, able to read  Cardiovascular:     Rate and Rhythm: Normal rate and regular rhythm.  Heart sounds: No murmur heard.   Pulmonary:     Effort: Pulmonary effort is normal.     Breath sounds: No wheezing or rales.     Comments: Anterior neck, sternal region wheezes, disappearing when breathing with mouth closed.   Abdominal:     Palpations: Abdomen is soft.     Tenderness: There is no abdominal tenderness.  Musculoskeletal:     Cervical back: Normal range of motion and neck supple.     Right lower leg: No edema.     Left lower leg: No edema.  Skin:    General: Skin is warm and dry.  Neurological:     General: No focal deficit present.     Mental Status: She is alert. Mental status is at baseline.     Motor: No weakness.     Gait: Gait normal.     Comments: Oriented to person and place. Able to walk  Psychiatric:        Mood and Affect: Mood normal.        Behavior: Behavior normal.        Thought Content: Thought content normal.        Judgment: Judgment normal.     Labs reviewed: Basic Metabolic Panel: Recent Labs    05/17/20 0700 11/29/20 1044  NA 140 145  K 4.6 4.8  CL 104 105  CO2 34* 31  GLUCOSE 93 85  BUN 31* 19  CREATININE 0.99* 0.96*  CALCIUM 9.7 9.5  TSH  --  1.82   Liver Function Tests: Recent Labs    05/17/20 0700 11/29/20 1044  AST 22 21  ALT 13 8  BILITOT 0.4 0.5  PROT 6.6 6.2   No results for input(s): LIPASE, AMYLASE in the last 8760 hours. No results for input(s): AMMONIA in the last 8760 hours. CBC: Recent Labs    05/17/20 0700 11/29/20 1044  WBC 5.7 4.8  NEUTROABS 3,067 2,784  HGB 12.9 12.5  HCT 39.3 38.4  MCV 94.0 93.9  PLT 207 172   Lipid Panel: Recent Labs    11/29/20 1044  CHOL 172  HDL 64   LDLCALC 87  TRIG 112  CHOLHDL 2.7   Lab Results  Component Value Date   HGBA1C 5.6 05/17/2020    Procedures since last visit: No results found.  Assessment/Plan  HTN (hypertension) takes Losartan, ASA,Furosemide76m. Bun/creat 19/0.96 11/29/20, update CMP/eGFR    Depression with anxiety takes Mirtazapine, TSH 1.82 11/29/20   Hyperlipidemia takes Omega 3, Simvastatin. LDL 87 11/29/20   Age-related osteoporosis without fracture  OP DEXA T score -4.3, declined Prolia, takes Vit D, Vit D level 40 05/17/20. 06/06/14 BMD T -2.7, off Alendronate 2nd to jaw bone necrotic process   H/O vocal cord paralysis  Vocal cord paralysis, underwent ENT evaluation in the past.   GERD (gastroesophageal reflux disease)  takes Omeprazole, Hgb 12.5 11/29/20    Labs/tests ordered:  CMP/eGFR prior to the next visit.   Next appt:  4 months

## 2021-01-25 NOTE — Assessment & Plan Note (Signed)
takes Omeprazole, Hgb 12.5 11/29/20

## 2021-01-25 NOTE — Assessment & Plan Note (Signed)
takes Losartan, ASA,Furosemide75m. Bun/creat 19/0.96 11/29/20, update CMP/eGFR

## 2021-01-25 NOTE — Assessment & Plan Note (Signed)
takes Omega 3, Simvastatin. LDL 87 11/29/20

## 2021-01-25 NOTE — Assessment & Plan Note (Addendum)
OP DEXA T score -4.3, declined Prolia, takes Vit D, Vit D level 40 05/17/20. 06/06/14 BMD T -2.7, off Alendronate 2nd to jaw bone necrotic process

## 2021-01-25 NOTE — Assessment & Plan Note (Signed)
Vocal cord paralysis, underwent ENT evaluation in the past.

## 2021-01-26 ENCOUNTER — Encounter: Payer: Self-pay | Admitting: Nurse Practitioner

## 2021-02-14 DIAGNOSIS — H35372 Puckering of macula, left eye: Secondary | ICD-10-CM | POA: Diagnosis not present

## 2021-02-14 DIAGNOSIS — H35352 Cystoid macular degeneration, left eye: Secondary | ICD-10-CM | POA: Diagnosis not present

## 2021-03-06 ENCOUNTER — Other Ambulatory Visit: Payer: Self-pay | Admitting: Internal Medicine

## 2021-03-06 DIAGNOSIS — E785 Hyperlipidemia, unspecified: Secondary | ICD-10-CM

## 2021-05-10 ENCOUNTER — Other Ambulatory Visit: Payer: Self-pay

## 2021-05-10 DIAGNOSIS — I10 Essential (primary) hypertension: Secondary | ICD-10-CM | POA: Diagnosis not present

## 2021-05-10 LAB — COMPLETE METABOLIC PANEL WITH GFR
AG Ratio: 1.9 (calc) (ref 1.0–2.5)
ALT: 14 U/L (ref 6–29)
AST: 23 U/L (ref 10–35)
Albumin: 4.4 g/dL (ref 3.6–5.1)
Alkaline phosphatase (APISO): 92 U/L (ref 37–153)
BUN/Creatinine Ratio: 21 (calc) (ref 6–22)
BUN: 23 mg/dL (ref 7–25)
CO2: 32 mmol/L (ref 20–32)
Calcium: 9.8 mg/dL (ref 8.6–10.4)
Chloride: 104 mmol/L (ref 98–110)
Creat: 1.1 mg/dL — ABNORMAL HIGH (ref 0.60–0.88)
GFR, Est African American: 51 mL/min/{1.73_m2} — ABNORMAL LOW (ref >=60)
GFR, Est Non African American: 44 mL/min/{1.73_m2} — ABNORMAL LOW (ref >=60)
Globulin: 2.3 g/dL (calc) (ref 1.9–3.7)
Glucose, Bld: 103 mg/dL — ABNORMAL HIGH (ref 65–99)
Potassium: 4.4 mmol/L (ref 3.5–5.3)
Sodium: 142 mmol/L (ref 135–146)
Total Bilirubin: 0.5 mg/dL (ref 0.2–1.2)
Total Protein: 6.7 g/dL (ref 6.1–8.1)

## 2021-05-11 ENCOUNTER — Encounter: Payer: Self-pay | Admitting: Nurse Practitioner

## 2021-05-11 DIAGNOSIS — N183 Chronic kidney disease, stage 3 unspecified: Secondary | ICD-10-CM

## 2021-05-11 HISTORY — DX: Chronic kidney disease, stage 3 unspecified: N18.30

## 2021-05-17 ENCOUNTER — Encounter: Payer: Medicare Other | Admitting: Nurse Practitioner

## 2021-05-18 ENCOUNTER — Encounter: Payer: Self-pay | Admitting: Internal Medicine

## 2021-05-21 DIAGNOSIS — Z961 Presence of intraocular lens: Secondary | ICD-10-CM | POA: Diagnosis not present

## 2021-05-21 DIAGNOSIS — H401122 Primary open-angle glaucoma, left eye, moderate stage: Secondary | ICD-10-CM | POA: Diagnosis not present

## 2021-05-21 DIAGNOSIS — H401113 Primary open-angle glaucoma, right eye, severe stage: Secondary | ICD-10-CM | POA: Diagnosis not present

## 2021-05-24 ENCOUNTER — Other Ambulatory Visit: Payer: Self-pay | Admitting: Internal Medicine

## 2021-05-28 ENCOUNTER — Other Ambulatory Visit: Payer: Self-pay | Admitting: Nurse Practitioner

## 2021-05-28 DIAGNOSIS — E785 Hyperlipidemia, unspecified: Secondary | ICD-10-CM

## 2021-06-06 ENCOUNTER — Encounter: Payer: Self-pay | Admitting: Internal Medicine

## 2021-06-06 ENCOUNTER — Other Ambulatory Visit: Payer: Self-pay

## 2021-06-06 ENCOUNTER — Non-Acute Institutional Stay: Payer: Medicare Other | Admitting: Internal Medicine

## 2021-06-06 VITALS — BP 142/70 | HR 97 | Temp 96.2°F | Ht 64.0 in | Wt 116.8 lb

## 2021-06-06 DIAGNOSIS — E7849 Other hyperlipidemia: Secondary | ICD-10-CM

## 2021-06-06 DIAGNOSIS — E785 Hyperlipidemia, unspecified: Secondary | ICD-10-CM

## 2021-06-06 DIAGNOSIS — R634 Abnormal weight loss: Secondary | ICD-10-CM

## 2021-06-06 DIAGNOSIS — F418 Other specified anxiety disorders: Secondary | ICD-10-CM | POA: Diagnosis not present

## 2021-06-06 DIAGNOSIS — M818 Other osteoporosis without current pathological fracture: Secondary | ICD-10-CM | POA: Diagnosis not present

## 2021-06-06 DIAGNOSIS — I10 Essential (primary) hypertension: Secondary | ICD-10-CM

## 2021-06-06 DIAGNOSIS — K219 Gastro-esophageal reflux disease without esophagitis: Secondary | ICD-10-CM | POA: Diagnosis not present

## 2021-06-06 DIAGNOSIS — Z8709 Personal history of other diseases of the respiratory system: Secondary | ICD-10-CM | POA: Diagnosis not present

## 2021-06-06 DIAGNOSIS — H401130 Primary open-angle glaucoma, bilateral, stage unspecified: Secondary | ICD-10-CM | POA: Diagnosis not present

## 2021-06-06 MED ORDER — SIMVASTATIN 10 MG PO TABS: ORAL_TABLET | ORAL | 2 refills | Status: DC

## 2021-06-06 MED ORDER — ESCITALOPRAM OXALATE 5 MG PO TABS
5.0000 mg | ORAL_TABLET | Freq: Every day | ORAL | 3 refills | Status: DC
Start: 2021-06-06 — End: 2021-07-30

## 2021-06-06 MED ORDER — LOSARTAN POTASSIUM 25 MG PO TABS: ORAL_TABLET | ORAL | 1 refills | Status: DC

## 2021-06-06 NOTE — Progress Notes (Signed)
pro  Location:  [place] of Service:  Clinic (12)  Provider:   Code Status: DNR Goals of Care:  Advanced Directives 06/06/2021  Does Patient Have a Medical Advance Directive? Yes  Type of Advance Directive Living will  Does patient want to make changes to medical advance directive? No - Patient declined  Copy of [place] in Chart? -  Would patient like information on creating a medical advance directive? -  Pre-existing out of facility DNR order (yellow form or pink MOST form) -     Chief Complaint  Patient presents with   Medical Management of Chronic Issues    Patient returns to the clinic with daughter.   Health Maintenance    #4 Covid vaccine, influenza, Shingrix    HPI: Patient is a 87 y.o. female seen today for medical management of chronic diseases.    Patient has a history of weight loss, corneal ulcer of left eye with glaucoma, hypertension, unstable gait, osteoporosis, vocal cord paralysis s/p cordotomy in 2003, hearing loss, HLD and depression  Weight loss with Anxiety and Depression Patient has lost some weight since I saw her 8 months ago She continues to be in Remeron But Per son who was with her today very anxious Worries about everything Paitent got very anxious while giving history Stats to get frustrated when she cannot come up with the right words. Due to problems with her teeth it takes a long time to chew.  She is not able to eat with others and lunchroom and has to take her food back to her room. Denies any nausea or vomiting  does have dysphagia.  No abdominal pain bowels are moving good.  Sleeping well on Remeron Walks with a walker now and gait is more stable with no falls Her eyes have recovered well and she is able to read and take care of her ADLs with no help  Past Medical History:  Diagnosis Date   Anemia    Anxiety    Apnea 05/2000   sleep study revealed mild upper airway obstruction & apnea during speel,  no set recommendations for therapy   Asthma    Diverticulosis    Dyspnea 12/15/09   spirometer normal & function actually worsens with nebk may have more of a "exercise-induced" type of bronchospasm    Fatigue 07/2015   GERD (gastroesophageal reflux disease)    Glaucoma    both eyes   Hyperlipidemia    Hypertension    Laryngeal paresis 09/12/2016   Loss of weight 06/20/2016   OSA (obstructive sleep apnea)    Osteoporosis, senile    Vocal cord dysfunction    per ENT, pt NOT to have elective intubation without disscussing with him    Past Surgical History:  Procedure Laterality Date   CATARACT EXTRACTION W/ INTRAOCULAR LENS  IMPLANT, BILATERAL  2011   Dr. Charise Killian   CERVICAL CONIZATION W/BX     due to cervical dysplasia   COLONOSCOPY  02/2002   Diverticublosis    EYE SURGERY Right 02/2016   laser surgery for scar tissure   nasolaryngoscopy  2001   reflux laryngitis   SKIN CANCER EXCISION     ?BCC   TONSILLECTOMY     UPPER GI ENDOSCOPY  05/2000   upper airway edema/changes that were consistent with GERD   vocal fold cordotomy Left 2003   Dr. Denyse Dago [facility]    Allergies  Allergen Reactions   Brimonidine Other (See  Comments)   Dorzolamide Hcl Itching    EYE PAIN    Outpatient Encounter Medications as of 06/06/2021  Medication Sig   aspirin EC 81 MG tablet Take 81 mg by mouth daily.   brinzolamide (AZOPT) 1 % ophthalmic suspension Place 1 drop into both eyes 3 (three) times daily.    Calcium-Vitamin D-Vitamin K (CALCIUM SOFT CHEWS PO) Take by mouth.   furosemide (LASIX) 20 MG tablet Take 0.5 tablets (10 mg total) by mouth daily.   losartan (COZAAR) 25 MG tablet TAKE TWO TABLETS ('50MG'$ ) BY MOUTH ONCE DAILY FOR BLOOD PRESSURE   mirtazapine (REMERON) 30 MG tablet TAKE 1 TABLET BY MOUTH EVERYDAY AT BEDTIME   Netarsudil Dimesylate 0.02 % SOLN Apply 1 drop to eye at bedtime. Both eyes   Omega-3 Fatty Acids (FISH OIL) 1000 MG CAPS Take 1 capsule by mouth daily.    simvastatin  (ZOCOR) 10 MG tablet TAKE ONE TABLET BY MOUTH AT BEDTIME TO CONTROL CHOLESTEROL   Timolol Maleate 0.5 % (DAILY) SOLN Place 1 drop into both eyes 2 (two) times daily.    vitamin C (ASCORBIC ACID) 500 MG tablet Take 500 mg by mouth daily.   VYZULTA 0.024 % SOLN Place 1 drop into both eyes at bedtime.    loteprednol (LOTEMAX) 0.2 % SUSP Place 1 drop into the left eye 6 (six) times daily. Left eye  (Patient not taking: Reported on 06/06/2021)   [DISCONTINUED] ketorolac (ACULAR) 0.5 % ophthalmic solution Place 1 drop into the left eye in the morning and at bedtime.    No facility-administered encounter medications on file as of 06/06/2021.    Review of Systems:  Review of Systems  Constitutional:  Positive for appetite change and unexpected weight change.  HENT:  Positive for trouble swallowing.   Eyes:  Positive for visual disturbance.  Respiratory: Negative.    Cardiovascular: Negative.   Gastrointestinal: Negative.   Genitourinary: Negative.   Musculoskeletal:  Positive for gait problem.  Skin: Negative.   Neurological:  Negative for dizziness.  Psychiatric/Behavioral:  Positive for dysphoric mood. The patient is nervous/anxious.    Health Maintenance  Topic Date Due   Zoster Vaccines- Shingrix (1 of 2) Never done   COVID-19 Vaccine (4 - Booster for Moderna series) 01/10/2021   INFLUENZA VACCINE  06/04/2021   TETANUS/TDAP  08/07/2025   DEXA SCAN  Completed   PNA vac Low Risk Adult  Completed   HPV VACCINES  Aged Out    Physical Exam: Vitals:   06/06/21 1412  BP: (!) 142/70  Pulse: 97  Temp: (!) 96.2 F (35.7 C)  SpO2: 96%  Weight: 116 lb 12.8 oz (53 kg)  Height: '5\' 4"'$  (1.626 m)   Body mass index is 20.05 kg/m. Physical Exam Constitutional: Oriented to person, place, and time. Well-developed and well-nourished.  HENT:  Head: Normocephalic.  Mouth/Throat: Oropharynx is clear and moist.  Eyes: Pupils are equal, round, and reactive to light.  Neck: Neck supple.   Cardiovascular: Normal rate and normal heart sounds.  No murmur heard. Pulmonary/Chest: Effort normal and breath sounds normal. No respiratory distress. No wheezes. She has no rales.  Abdominal: Soft. Bowel sounds are normal. No distension. There is no tenderness. There is no rebound.  Musculoskeletal: No edema.  Lymphadenopathy: none Neurological: Alert and oriented to person, place, and time.  Skin: Skin is warm and dry.  Psychiatric: Normal mood and affect. Behavior is normal. Thought content normal.   Labs reviewed: Basic Metabolic Panel: Recent Labs  11/29/20 1044 05/10/21 0700  NA 145 142  K 4.8 4.4  CL 105 104  CO2 31 32  GLUCOSE 85 103*  BUN 19 23  CREATININE 0.96* 1.10*  CALCIUM 9.5 9.8  TSH 1.82  --    Liver Function Tests: Recent Labs    11/29/20 1044 05/10/21 0700  AST 21 23  ALT 8 14  BILITOT 0.5 0.5  PROT 6.2 6.7   No results for input(s): LIPASE, AMYLASE in the last 8760 hours. No results for input(s): AMMONIA in the last 8760 hours. CBC: Recent Labs    11/29/20 1044  WBC 4.8  NEUTROABS 2,784  HGB 12.5  HCT 38.4  MCV 93.9  PLT 172   Lipid Panel: Recent Labs    11/29/20 1044  CHOL 172  HDL 64  LDLCALC 87  TRIG 112  CHOLHDL 2.7   Lab Results  Component Value Date   HGBA1C 5.6 05/17/2020    Procedures since last visit: No results found.  Assessment/Plan Primary hypertension Doing well on Cozaar Hyperlipidemia - Plan: simvastatin (ZOCOR) 10 MG tablet LDL 87 in 01/22 Depression with anxiety On Remeron D/w Patient and her son will Try Lexapro 5 mg If tolerate increase in next visit with [facility] Age-related osteoporosis without fracture Has refused Prolia H/O vocal cord paralysis Stable Gastroesophageal reflux disease without esophagitis On Prilosec Weight loss Discussed again with the patient and her son Her weight loss is mostly related to her chewing issue She used to take supplements and is open to try them again At  her age and Frailty  would not recommend further imaging as she is completely asymptomatic  Primary open angle glaucoma of both eyes,  Doing better and follows with ophthalmologist   Labs/tests ordered:  * No order type specified * Next appt:  Visit date not found

## 2021-06-06 NOTE — Patient Instructions (Signed)
I am starting you in new Meds for Depression and Anxiety Take Boost every other day to see if it helps with your Calorie Intake

## 2021-06-29 ENCOUNTER — Other Ambulatory Visit: Payer: Self-pay | Admitting: Internal Medicine

## 2021-07-11 DIAGNOSIS — H35352 Cystoid macular degeneration, left eye: Secondary | ICD-10-CM | POA: Diagnosis not present

## 2021-07-11 DIAGNOSIS — H35372 Puckering of macula, left eye: Secondary | ICD-10-CM | POA: Diagnosis not present

## 2021-07-28 ENCOUNTER — Other Ambulatory Visit: Payer: Self-pay | Admitting: Internal Medicine

## 2021-08-07 ENCOUNTER — Other Ambulatory Visit: Payer: Self-pay

## 2021-08-07 ENCOUNTER — Encounter: Payer: Self-pay | Admitting: Nurse Practitioner

## 2021-08-07 ENCOUNTER — Non-Acute Institutional Stay: Payer: Medicare Other | Admitting: Nurse Practitioner

## 2021-08-07 DIAGNOSIS — I130 Hypertensive heart and chronic kidney disease with heart failure and stage 1 through stage 4 chronic kidney disease, or unspecified chronic kidney disease: Secondary | ICD-10-CM | POA: Diagnosis not present

## 2021-08-07 DIAGNOSIS — K219 Gastro-esophageal reflux disease without esophagitis: Secondary | ICD-10-CM | POA: Diagnosis not present

## 2021-08-07 DIAGNOSIS — N183 Chronic kidney disease, stage 3 unspecified: Secondary | ICD-10-CM

## 2021-08-07 DIAGNOSIS — M818 Other osteoporosis without current pathological fracture: Secondary | ICD-10-CM | POA: Diagnosis not present

## 2021-08-07 DIAGNOSIS — F418 Other specified anxiety disorders: Secondary | ICD-10-CM

## 2021-08-07 DIAGNOSIS — E7849 Other hyperlipidemia: Secondary | ICD-10-CM | POA: Diagnosis not present

## 2021-08-07 DIAGNOSIS — J3802 Paralysis of vocal cords and larynx, bilateral: Secondary | ICD-10-CM

## 2021-08-07 DIAGNOSIS — I10 Essential (primary) hypertension: Secondary | ICD-10-CM | POA: Diagnosis not present

## 2021-08-07 NOTE — Assessment & Plan Note (Signed)
Vocal cord paralysis, underwent ENT evaluation in the past.

## 2021-08-07 NOTE — Assessment & Plan Note (Signed)
,   takes Omega 3, Simvastatin. LDL 87 11/29/20

## 2021-08-07 NOTE — Assessment & Plan Note (Signed)
Stable, takes Omeprazole, Hgb 12.5 11/29/20

## 2021-08-07 NOTE — Assessment & Plan Note (Signed)
Doing better,  takes Lexapro since 06/06/21, already on Mirtazapine 30mg  qd, TSH 1.82 11/29/20

## 2021-08-07 NOTE — Assessment & Plan Note (Signed)
Blood pressure is controlled, takes Losartan, ASA, Furosemide 64m. Bun/creat 23/1.10, eGFR 51 05/10/21

## 2021-08-07 NOTE — Progress Notes (Signed)
Location:  clinic [place]    Place of Service:   clinic [place] Provider: Marlana Latus NP  Code Status: DNR Goals of Care: [place] Advanced Directives 08/07/2021  Does Patient Have a Medical Advance Directive? Yes  Type of Paramedic of [place];Living will  Does patient want to make changes to medical advance directive? No - Patient declined  Copy of [place] in Chart? Yes - validated most recent copy scanned in chart (See row information)  Would patient like information on creating a medical advance directive? -  Pre-existing out of facility DNR order (yellow form or pink MOST form) -     Chief Complaint  Patient presents with   Medical Management of Chronic Issues    2 month follow up.   Immunizations    Discuss the need for Shingrix vaccine, Influenza vaccine, and 2nd Covid Booster.    HPI: Patient is a 87 y.o. female seen today for medical management of chronic diseases.      HTN, takes Losartan, ASA, Furosemide 81m. Bun/creat 23/1.10, eGFR 51 05/10/21  CKD Bun/creat 23/1.10, eGFR 51 05/10/21             Depression, takes Lexapro since 06/06/21, already on Mirtazapine 346mqd, TSH 1.82 11/29/20             Hyperlipidemia, takes Omega 3, Simvastatin. LDL 87 11/29/20             OP DEXA T score -4.3, declined Prolia, takes Vit D, Vit D level 40 05/17/20,  off Alendronate 2nd to jaw bone necrotic process             Vocal cord paralysis, underwent ENT evaluation in the past.              GERD, takes Omeprazole, Hgb 12.5 11/29/20 Past Medical History:  Diagnosis Date   Anemia    Anxiety    Apnea 05/2000   sleep study revealed mild upper airway obstruction & apnea during speel, no set recommendations for therapy   Asthma    Diverticulosis    Dyspnea 12/15/09   spirometer normal & function actually worsens with nebk may have more of a "exercise-induced" type of bronchospasm    Fatigue 07/2015   GERD (gastroesophageal reflux disease)    Glaucoma     both eyes   Hyperlipidemia    Hypertension    Laryngeal paresis 09/12/2016   Loss of weight 06/20/2016   OSA (obstructive sleep apnea)    Osteoporosis, senile    Vocal cord dysfunction    per ENT, pt NOT to have elective intubation without disscussing with him    Past Surgical History:  Procedure Laterality Date   CATARACT EXTRACTION W/ INTRAOCULAR LENS  IMPLANT, BILATERAL  2011   Dr. EpCharise Killian CERVICAL CONIZATION W/BX     due to cervical dysplasia   COLONOSCOPY  02/2002   Diverticublosis    EYE SURGERY Right 02/2016   laser surgery for scar tissure   nasolaryngoscopy  2001   reflux laryngitis   SKIN CANCER EXCISION     ?BCC   TONSILLECTOMY     UPPER GI ENDOSCOPY  05/2000   upper airway edema/changes that were consistent with GERD   [facility]  Allergies  Allergen Reactions   Brimonidine Other (See Comments)   Dorzolamide Hcl Itching    EYE PAIN    Allergies as of 08/07/2021  Reactions   Brimonidine Other (See Comments)   Dorzolamide Hcl Itching   EYE PAIN        Medication List        Accurate as of August 07, 2021 11:59 PM. If you have any questions, ask your nurse or doctor.          STOP taking these medications    Vyzulta 0.024 % Soln Generic drug: Latanoprostene Bunod Stopped by: Meyah Corle X Karol Skarzynski, NP       TAKE these medications    aspirin EC 81 MG tablet Take 81 mg by mouth daily.   brinzolamide 1 % ophthalmic suspension Commonly known as: AZOPT Place 1 drop into both eyes 3 (three) times daily.   CALCIUM SOFT CHEWS PO Take by mouth.   escitalopram 5 MG tablet Commonly known as: LEXAPRO TAKE 1 TABLET (5 MG TOTAL) BY MOUTH DAILY.   Fish Oil 1000 MG Caps Take 1 capsule by mouth daily.   furosemide 20 MG tablet Commonly known as: LASIX Take 0.5 tablets (10 mg total) by mouth daily.   ketorolac 0.4 % Soln Commonly known as: ACULAR Place 1 drop into the left eye 4 (four) times  daily.   losartan 25 MG tablet Commonly known as: COZAAR TAKE TWO TABLETS (50MG) BY MOUTH ONCE DAILY FOR BLOOD PRESSURE   loteprednol 0.2 % Susp Commonly known as: LOTEMAX Place 1 drop into the left eye in the morning, at noon, in the evening, and at bedtime. What changed: Another medication with the same name was removed. Continue taking this medication, and follow the directions you see here. Changed by: Liandro Thelin X Caillou Minus, NP   mirtazapine 30 MG tablet Commonly known as: REMERON TAKE 1 TABLET BY MOUTH EVERYDAY AT BEDTIME   Netarsudil Dimesylate 0.02 % Soln Apply 1 drop to eye at bedtime. Both eyes   simvastatin 10 MG tablet Commonly known as: ZOCOR Take one tablet every day   Timolol Maleate (Once-Daily) 0.5 % Soln Place 1 drop into both eyes 2 (two) times daily.   vitamin C 500 MG tablet Commonly known as: ASCORBIC ACID Take 500 mg by mouth daily.        Review of Systems:  Review of Systems  Constitutional:  Negative for fatigue, fever and unexpected weight change.  HENT:  Positive for hearing loss and voice change. Negative for congestion.        Vocal cord paralysis.   Respiratory:  Negative for cough and wheezing.   Cardiovascular:  Positive for leg swelling.  Gastrointestinal:  Negative for abdominal pain and constipation.  Genitourinary:  Negative for dysuria and urgency.  Musculoskeletal:  Positive for arthralgias and gait problem.  Skin:  Negative for color change.  Neurological:  Negative for speech difficulty, weakness and light-headedness.       Memory lapses.   Psychiatric/Behavioral:  Negative for confusion and sleep disturbance. The patient is not nervous/anxious.    Health Maintenance  Topic Date Due   Zoster Vaccines- Shingrix (1 of 2) Never done   COVID-19 Vaccine (4 - Booster for Moderna series) 01/10/2021   INFLUENZA VACCINE  06/04/2021   TETANUS/TDAP  08/07/2025   DEXA SCAN  Completed   HPV VACCINES  Aged Out    Physical Exam: Vitals:    08/07/21 1333  BP: 130/88  Pulse: 90  Resp: 16  Temp: 98.9 F (37.2 C)  SpO2: 94%  Weight: 119 lb 3.2 oz (54.1 kg)  Height: 5' 4"  (1.626 m)   Body mass index  is 20.46 kg/m. Physical Exam Vitals reviewed.  Constitutional:      Appearance: Normal appearance.  HENT:     Head: Normocephalic and atraumatic.     Nose: Nose normal.     Mouth/Throat:     Mouth: Mucous membranes are moist.     Comments: Torus plantinus Eyes:     Extraocular Movements: Extraocular movements intact.     Conjunctiva/sclera: Conjunctivae normal.     Pupils: Pupils are equal, round, and reactive to light.     Comments: Left lower eyelid entropion, Hx of left corneal ulceration, R+L glaucoma, able to read  Cardiovascular:     Rate and Rhythm: Normal rate and regular rhythm.     Heart sounds: No murmur heard. Pulmonary:     Effort: Pulmonary effort is normal.     Breath sounds: No wheezing or rales.     Comments: Anterior neck, sternal region wheezes, disappearing when breathing with mouth closed.   Abdominal:     Palpations: Abdomen is soft.     Tenderness: There is no abdominal tenderness.  Musculoskeletal:     Cervical back: Normal range of motion and neck supple.     Right lower leg: Edema present.     Left lower leg: Edema present.     Comments: Trace edema BLE  Skin:    General: Skin is warm and dry.  Neurological:     General: No focal deficit present.     Mental Status: She is alert. Mental status is at baseline.     Motor: No weakness.     Gait: Gait normal.     Comments: Oriented to person and place. Able to walk  Psychiatric:        Mood and Affect: Mood normal.        Behavior: Behavior normal.        Thought Content: Thought content normal.        Judgment: Judgment normal.    Labs reviewed: Basic Metabolic Panel: Recent Labs    11/29/20 1044 05/10/21 0700  NA 145 142  K 4.8 4.4  CL 105 104  CO2 31 32  GLUCOSE 85 103*  BUN 19 23  CREATININE 0.96* 1.10*  CALCIUM 9.5  9.8  TSH 1.82  --    Liver Function Tests: Recent Labs    11/29/20 1044 05/10/21 0700  AST 21 23  ALT 8 14  BILITOT 0.5 0.5  PROT 6.2 6.7   No results for input(s): LIPASE, AMYLASE in the last 8760 hours. No results for input(s): AMMONIA in the last 8760 hours. CBC: Recent Labs    11/29/20 1044  WBC 4.8  NEUTROABS 2,784  HGB 12.5  HCT 38.4  MCV 93.9  PLT 172   Lipid Panel: Recent Labs    11/29/20 1044  CHOL 172  HDL 64  LDLCALC 87  TRIG 112  CHOLHDL 2.7   Lab Results  Component Value Date   HGBA1C 5.6 05/17/2020    Procedures since last visit: No results found.  Assessment/Plan  HTN (hypertension) Blood pressure is controlled, takes Losartan, ASA, Furosemide 35m. Bun/creat 23/1.10, eGFR 51 05/10/21  Benign hypertensive heart and CKD, stage 3 (GFR 30-59), w CHF ([place] Bun/creat 23/1.10, eGFR 51 05/10/21  Depression with anxiety Doing better,  takes Lexapro since 06/06/21, already on Mirtazapine 329mqd, TSH 1.82 11/29/20  Hyperlipidemia , takes Omega 3, Simvastatin. LDL 87 11/29/20  Age-related osteoporosis without fracture DEXA T score -4.3, declined Prolia, takes Vit D, Vit D  level 40 05/17/20,  off Alendronate 2nd to jaw bone necrotic process  Vocal fold paralysis, bilateral Vocal cord paralysis, underwent ENT evaluation in the past.   GERD (gastroesophageal reflux disease) Stable, takes Omeprazole, Hgb 12.5 11/29/20   Labs/tests ordered:  CBC/diff, CMP/eGFR  Next appt:  3 months.

## 2021-08-07 NOTE — Assessment & Plan Note (Signed)
Bun/creat 23/1.10, eGFR 51 05/10/21 

## 2021-08-07 NOTE — Assessment & Plan Note (Signed)
DEXA T score -4.3, declined Prolia, takes Vit D, Vit D level 40 05/17/20,  off Alendronate 2nd to jaw bone necrotic process 

## 2021-08-08 ENCOUNTER — Encounter: Payer: Self-pay | Admitting: Nurse Practitioner

## 2021-08-08 DIAGNOSIS — H401122 Primary open-angle glaucoma, left eye, moderate stage: Secondary | ICD-10-CM | POA: Diagnosis not present

## 2021-08-08 DIAGNOSIS — H401113 Primary open-angle glaucoma, right eye, severe stage: Secondary | ICD-10-CM | POA: Diagnosis not present

## 2021-08-08 DIAGNOSIS — Z961 Presence of intraocular lens: Secondary | ICD-10-CM | POA: Diagnosis not present

## 2021-08-14 DIAGNOSIS — J3802 Paralysis of vocal cords and larynx, bilateral: Secondary | ICD-10-CM | POA: Diagnosis not present

## 2021-08-14 DIAGNOSIS — J384 Edema of larynx: Secondary | ICD-10-CM | POA: Diagnosis not present

## 2021-08-14 DIAGNOSIS — J383 Other diseases of vocal cords: Secondary | ICD-10-CM | POA: Diagnosis not present

## 2021-08-20 DIAGNOSIS — Z23 Encounter for immunization: Secondary | ICD-10-CM | POA: Diagnosis not present

## 2021-09-01 ENCOUNTER — Other Ambulatory Visit: Payer: Self-pay | Admitting: Nurse Practitioner

## 2021-09-02 ENCOUNTER — Other Ambulatory Visit: Payer: Self-pay | Admitting: Nurse Practitioner

## 2021-09-03 NOTE — Telephone Encounter (Signed)
Patient has request refill on medication "Mirtazapine". Patient last refill was 12/08/2020. Medication has warnings. Medication pend and sent to PCP Mast, Man X, NP for approval.

## 2021-10-10 DIAGNOSIS — H35372 Puckering of macula, left eye: Secondary | ICD-10-CM | POA: Diagnosis not present

## 2021-10-10 DIAGNOSIS — H35352 Cystoid macular degeneration, left eye: Secondary | ICD-10-CM | POA: Diagnosis not present

## 2021-10-18 DIAGNOSIS — Z23 Encounter for immunization: Secondary | ICD-10-CM | POA: Diagnosis not present

## 2021-10-31 DIAGNOSIS — H401113 Primary open-angle glaucoma, right eye, severe stage: Secondary | ICD-10-CM | POA: Diagnosis not present

## 2021-11-06 ENCOUNTER — Other Ambulatory Visit: Payer: Self-pay

## 2021-11-06 DIAGNOSIS — K219 Gastro-esophageal reflux disease without esophagitis: Secondary | ICD-10-CM | POA: Diagnosis not present

## 2021-11-06 DIAGNOSIS — I130 Hypertensive heart and chronic kidney disease with heart failure and stage 1 through stage 4 chronic kidney disease, or unspecified chronic kidney disease: Secondary | ICD-10-CM

## 2021-11-06 DIAGNOSIS — N183 Chronic kidney disease, stage 3 unspecified: Secondary | ICD-10-CM | POA: Diagnosis not present

## 2021-11-06 LAB — COMPLETE METABOLIC PANEL WITH GFR
AG Ratio: 1.6 (calc) (ref 1.0–2.5)
ALT: 9 U/L (ref 6–29)
AST: 21 U/L (ref 10–35)
Albumin: 4.1 g/dL (ref 3.6–5.1)
Alkaline phosphatase (APISO): 106 U/L (ref 37–153)
BUN/Creatinine Ratio: 17 (calc) (ref 6–22)
BUN: 18 mg/dL (ref 7–25)
CO2: 35 mmol/L — ABNORMAL HIGH (ref 20–32)
Calcium: 9.5 mg/dL (ref 8.6–10.4)
Chloride: 104 mmol/L (ref 98–110)
Creat: 1.03 mg/dL — ABNORMAL HIGH (ref 0.60–0.95)
Globulin: 2.5 g/dL (calc) (ref 1.9–3.7)
Glucose, Bld: 98 mg/dL (ref 65–99)
Potassium: 4.1 mmol/L (ref 3.5–5.3)
Sodium: 144 mmol/L (ref 135–146)
Total Bilirubin: 0.4 mg/dL (ref 0.2–1.2)
Total Protein: 6.6 g/dL (ref 6.1–8.1)
eGFR: 51 mL/min/{1.73_m2} — ABNORMAL LOW (ref >=60)

## 2021-11-06 LAB — CBC WITH DIFFERENTIAL/PLATELET
Absolute Monocytes: 529 cells/uL (ref 200–950)
Basophils Absolute: 51 cells/uL (ref 0–200)
Basophils Relative: 1.1 %
Eosinophils Absolute: 262 cells/uL (ref 15–500)
Eosinophils Relative: 5.7 %
HCT: 40.1 % (ref 35.0–45.0)
Hemoglobin: 13.1 g/dL (ref 11.7–15.5)
Lymphs Abs: 1523 cells/uL (ref 850–3900)
MCH: 30.8 pg (ref 27.0–33.0)
MCHC: 32.7 g/dL (ref 32.0–36.0)
MCV: 94.4 fL (ref 80.0–100.0)
MPV: 11.1 fL (ref 7.5–12.5)
Monocytes Relative: 11.5 %
Neutro Abs: 2236 cells/uL (ref 1500–7800)
Neutrophils Relative %: 48.6 %
Platelets: 188 10*3/uL (ref 140–400)
RBC: 4.25 10*6/uL (ref 3.80–5.10)
RDW: 12.5 % (ref 11.0–15.0)
Total Lymphocyte: 33.1 %
WBC: 4.6 10*3/uL (ref 3.8–10.8)

## 2021-11-07 ENCOUNTER — Other Ambulatory Visit: Payer: Medicare Other

## 2021-11-14 ENCOUNTER — Other Ambulatory Visit: Payer: Self-pay

## 2021-11-14 ENCOUNTER — Non-Acute Institutional Stay: Payer: Medicare Other | Admitting: Internal Medicine

## 2021-11-14 ENCOUNTER — Encounter: Payer: Self-pay | Admitting: Internal Medicine

## 2021-11-14 VITALS — BP 148/79 | HR 91 | Temp 98.3°F | Ht 64.0 in | Wt 119.3 lb

## 2021-11-14 DIAGNOSIS — I10 Essential (primary) hypertension: Secondary | ICD-10-CM

## 2021-11-14 DIAGNOSIS — J3802 Paralysis of vocal cords and larynx, bilateral: Secondary | ICD-10-CM

## 2021-11-14 DIAGNOSIS — M818 Other osteoporosis without current pathological fracture: Secondary | ICD-10-CM

## 2021-11-14 DIAGNOSIS — F418 Other specified anxiety disorders: Secondary | ICD-10-CM | POA: Diagnosis not present

## 2021-11-14 DIAGNOSIS — R634 Abnormal weight loss: Secondary | ICD-10-CM

## 2021-11-14 DIAGNOSIS — E7849 Other hyperlipidemia: Secondary | ICD-10-CM

## 2021-11-17 NOTE — Progress Notes (Signed)
Location:  [place] of Service:  Clinic (12)  Provider:   Code Status: DNR Goals of Care:  Advanced Directives 08/07/2021  Does Patient Have a Medical Advance Directive? Yes  Type of Paramedic of [place];Living will  Does patient want to make changes to medical advance directive? No - Patient declined  Copy of [place] in Chart? Yes - validated most recent copy scanned in chart (See row information)  Would patient like information on creating a medical advance directive? -  Pre-existing out of facility DNR order (yellow form or pink MOST form) -     Chief Complaint  Patient presents with   Medical Management of [place] resident here today with her son for 3 month follow up. She would like to discuss getting a handicap sticker.    Quality Metric Gaps    Shingrix    HPI: Patient is a 88 y.o. female seen today for medical management of chronic diseases.    Patient has a history of weight loss, corneal ulcer of left eye with glaucoma, hypertension, unstable gait, osteoporosis, vocal cord paralysis s/p cordotomy in 2003, hearing loss, HLD and depression   Weight loss with Anxiety and Depression Started on Lexapro Seems to have done well Son thinks she is better but ? If we can increase the dose but patient wants ot wait Due to problems with her teeth it takes a long time to chew.  She is not able to eat with others and lunchroom and has to take her food back to her room. Denies any nausea or vomiting  But her weight is stable recently Looks more calm today  Continues ot have issues with her vision and has to take many Eye drops Recent surgery for Glaucoma Managing well with her Childrens helps Walks with her walker but does not need any assist Past Medical History:  Diagnosis Date   Anemia    Anxiety    Apnea 05/2000   sleep study revealed mild upper airway obstruction & apnea  during speel, no set recommendations for therapy   Asthma    Diverticulosis    Dyspnea 12/15/09   spirometer normal & function actually worsens with nebk may have more of a "exercise-induced" type of bronchospasm    Fatigue 07/2015   GERD (gastroesophageal reflux disease)    Glaucoma    both eyes   Hyperlipidemia    Hypertension    Laryngeal paresis 09/12/2016   Loss of weight 06/20/2016   OSA (obstructive sleep apnea)    Osteoporosis, senile    Vocal cord dysfunction    per ENT, pt NOT to have elective intubation without disscussing with him    Past Surgical History:  Procedure Laterality Date   CATARACT EXTRACTION W/ INTRAOCULAR LENS  IMPLANT, BILATERAL  2011   Dr. Charise Killian   CERVICAL CONIZATION W/BX     due to cervical dysplasia   COLONOSCOPY  02/2002   Diverticublosis    EYE SURGERY Right 02/2016   laser surgery for scar tissure   nasolaryngoscopy  2001   reflux laryngitis   SKIN CANCER EXCISION     ?BCC   TONSILLECTOMY     UPPER GI ENDOSCOPY  05/2000   upper airway edema/changes that were consistent with GERD   vocal fold cordotomy Left 2003   Dr. Denyse Dago [facility]    Allergies  Allergen Reactions   Brimonidine Other (See Comments)  Dorzolamide Hcl Itching    EYE PAIN    Outpatient Encounter Medications as of 11/14/2021  Medication Sig   aspirin EC 81 MG tablet Take 81 mg by mouth daily.   brinzolamide (AZOPT) 1 % ophthalmic suspension Place 1 drop into both eyes 3 (three) times daily.    Calcium-Vitamin D-Vitamin K (CALCIUM SOFT CHEWS PO) Take by mouth.   escitalopram (LEXAPRO) 5 MG tablet TAKE 1 TABLET (5 MG TOTAL) BY MOUTH DAILY.   furosemide (LASIX) 20 MG tablet TAKE 1/2 TABLET BY MOUTH DAILY   ketorolac (ACULAR) 0.4 % SOLN Place 1 drop into the left eye 4 (four) times daily.   losartan (COZAAR) 25 MG tablet TAKE TWO TABLETS (50MG ) BY MOUTH ONCE DAILY FOR BLOOD PRESSURE   loteprednol (LOTEMAX) 0.2 % SUSP Place 1 drop into the left eye in the morning, at noon,  in the evening, and at bedtime.   mirtazapine (REMERON) 30 MG tablet TAKE 1 TABLET BY MOUTH EVERYDAY AT BEDTIME   Netarsudil Dimesylate 0.02 % SOLN Apply 1 drop to eye at bedtime. Both eyes   Omega-3 Fatty Acids (FISH OIL) 1000 MG CAPS Take 1 capsule by mouth daily.    simvastatin (ZOCOR) 10 MG tablet Take one tablet every day   Timolol Maleate 0.5 % (DAILY) SOLN Place 1 drop into both eyes 2 (two) times daily.    vitamin C (ASCORBIC ACID) 500 MG tablet Take 500 mg by mouth daily.   No facility-administered encounter medications on file as of 11/14/2021.    Review of Systems:  Review of Systems  Constitutional:  Negative for activity change and appetite change.  HENT: Negative.    Eyes:  Positive for visual disturbance.  Respiratory:  Negative for cough and shortness of breath.   Cardiovascular:  Negative for leg swelling.  Gastrointestinal:  Negative for constipation.  Genitourinary: Negative.   Musculoskeletal:  Negative for arthralgias, gait problem and myalgias.  Skin: Negative.   Neurological:  Negative for dizziness and weakness.  Psychiatric/Behavioral:  Positive for dysphoric mood. Negative for confusion and sleep disturbance. The patient is nervous/anxious.    Health Maintenance  Topic Date Due   Zoster Vaccines- Shingrix (1 of 2) Never done   TETANUS/TDAP  08/07/2025   Pneumonia Vaccine 47+ Years old  Completed   INFLUENZA VACCINE  Completed   DEXA SCAN  Completed   COVID-19 Vaccine  Completed   HPV VACCINES  Aged Out    Physical Exam: Vitals:   11/14/21 1551  BP: (!) 148/79  Pulse: 91  Temp: 98.3 F (36.8 C)  SpO2: 97%  Weight: 119 lb 4.8 oz (54.1 kg)  Height: 5\' 4"  (1.626 m)   Body mass index is 20.48 kg/m. Physical Exam Vitals reviewed.  Constitutional:      Appearance: Normal appearance.     Comments: Frail  HENT:     Head: Normocephalic.     Nose: Nose normal.     Mouth/Throat:     Mouth: Mucous membranes are moist.     Pharynx: Oropharynx is  clear.  Eyes:     Pupils: Pupils are equal, round, and reactive to light.  Cardiovascular:     Rate and Rhythm: Normal rate and regular rhythm.     Pulses: Normal pulses.     Heart sounds: Normal heart sounds. No murmur heard. Pulmonary:     Effort: Pulmonary effort is normal.     Breath sounds: Normal breath sounds.     Comments: Audible Wheezing due to Vocal  cord dysfunction Abdominal:     General: Abdomen is flat. Bowel sounds are normal.     Palpations: Abdomen is soft.  Musculoskeletal:        General: No swelling.     Cervical back: Neck supple.  Skin:    General: Skin is warm.  Neurological:     General: No focal deficit present.     Mental Status: She is alert and oriented to person, place, and time.  Psychiatric:        Mood and Affect: Mood normal.        Thought Content: Thought content normal.    Labs reviewed: Basic Metabolic Panel: Recent Labs    11/29/20 1044 05/10/21 0700 11/06/21 0735  NA 145 142 144  K 4.8 4.4 4.1  CL 105 104 104  CO2 31 32 35*  GLUCOSE 85 103* 98  BUN 19 23 18   CREATININE 0.96* 1.10* 1.03*  CALCIUM 9.5 9.8 9.5  TSH 1.82  --   --    Liver Function Tests: Recent Labs    11/29/20 1044 05/10/21 0700 11/06/21 0735  AST 21 23 21   ALT 8 14 9   BILITOT 0.5 0.5 0.4  PROT 6.2 6.7 6.6   No results for input(s): LIPASE, AMYLASE in the last 8760 hours. No results for input(s): AMMONIA in the last 8760 hours. CBC: Recent Labs    11/29/20 1044 11/06/21 0735  WBC 4.8 4.6  NEUTROABS 2,784 2,236  HGB 12.5 13.1  HCT 38.4 40.1  MCV 93.9 94.4  PLT 172 188   Lipid Panel: Recent Labs    11/29/20 1044  CHOL 172  HDL 64  LDLCALC 87  TRIG 112  CHOLHDL 2.7   Lab Results  Component Value Date   HGBA1C 5.6 05/17/2020    Procedures since last visit: No results found.  Assessment/Plan 1. Primary hypertension On Cozaar  2. Depression with anxiety Continue Lexapro Can increase the dose if she wants next visit Also on  Remeron  3. Other hyperlipidemia On statin  4. Age-related osteoporosis without fracture Refused Prolia  5. Vocal fold paralysis, bilateral stable  6. LE edema On Low dose of Lasix 7. Weight loss Stabilized Her weight loss is mostly related to her chewing issue At her age and Frailty  would not recommend further imaging as she is completely asymptomatic Primary open angle glaucoma of both eyes,  Doing better and follows with ophthalmologist  Labs/tests ordered:  * No order type specified * Next appt:  Visit date not found

## 2021-12-10 DIAGNOSIS — Z961 Presence of intraocular lens: Secondary | ICD-10-CM | POA: Diagnosis not present

## 2021-12-10 DIAGNOSIS — H401113 Primary open-angle glaucoma, right eye, severe stage: Secondary | ICD-10-CM | POA: Diagnosis not present

## 2021-12-10 DIAGNOSIS — H401122 Primary open-angle glaucoma, left eye, moderate stage: Secondary | ICD-10-CM | POA: Diagnosis not present

## 2022-01-09 DIAGNOSIS — H35372 Puckering of macula, left eye: Secondary | ICD-10-CM | POA: Diagnosis not present

## 2022-01-09 DIAGNOSIS — H35352 Cystoid macular degeneration, left eye: Secondary | ICD-10-CM | POA: Diagnosis not present

## 2022-02-14 ENCOUNTER — Other Ambulatory Visit: Payer: Self-pay | Admitting: Internal Medicine

## 2022-04-17 ENCOUNTER — Other Ambulatory Visit: Payer: Self-pay | Admitting: Orthopedic Surgery

## 2022-04-17 ENCOUNTER — Other Ambulatory Visit: Payer: Self-pay | Admitting: Nurse Practitioner

## 2022-04-17 NOTE — Telephone Encounter (Signed)
High risk warning populated when attempting to approve refill request. Will send to covering provider, Yvonna Alanis, NP to review.

## 2022-05-01 DIAGNOSIS — H401122 Primary open-angle glaucoma, left eye, moderate stage: Secondary | ICD-10-CM | POA: Diagnosis not present

## 2022-05-01 DIAGNOSIS — Z961 Presence of intraocular lens: Secondary | ICD-10-CM | POA: Diagnosis not present

## 2022-05-01 DIAGNOSIS — H401113 Primary open-angle glaucoma, right eye, severe stage: Secondary | ICD-10-CM | POA: Diagnosis not present

## 2022-05-09 ENCOUNTER — Other Ambulatory Visit: Payer: Self-pay | Admitting: Internal Medicine

## 2022-05-09 DIAGNOSIS — E7849 Other hyperlipidemia: Secondary | ICD-10-CM

## 2022-05-14 ENCOUNTER — Other Ambulatory Visit: Payer: Self-pay | Admitting: Internal Medicine

## 2022-05-14 DIAGNOSIS — I10 Essential (primary) hypertension: Secondary | ICD-10-CM

## 2022-05-14 DIAGNOSIS — F418 Other specified anxiety disorders: Secondary | ICD-10-CM

## 2022-05-14 DIAGNOSIS — E7849 Other hyperlipidemia: Secondary | ICD-10-CM | POA: Diagnosis not present

## 2022-05-14 LAB — CBC WITH DIFFERENTIAL/PLATELET
Absolute Monocytes: 480 cells/uL (ref 200–950)
Basophils Absolute: 29 cells/uL (ref 0–200)
Basophils Relative: 0.6 %
Eosinophils Absolute: 120 cells/uL (ref 15–500)
Eosinophils Relative: 2.5 %
HCT: 40.5 % (ref 35.0–45.0)
Hemoglobin: 12.8 g/dL (ref 11.7–15.5)
Lymphs Abs: 1382 cells/uL (ref 850–3900)
MCH: 30.1 pg (ref 27.0–33.0)
MCHC: 31.6 g/dL — ABNORMAL LOW (ref 32.0–36.0)
MCV: 95.3 fL (ref 80.0–100.0)
MPV: 11 fL (ref 7.5–12.5)
Monocytes Relative: 10 %
Neutro Abs: 2789 cells/uL (ref 1500–7800)
Neutrophils Relative %: 58.1 %
Platelets: 211 10*3/uL (ref 140–400)
RBC: 4.25 10*6/uL (ref 3.80–5.10)
RDW: 12.6 % (ref 11.0–15.0)
Total Lymphocyte: 28.8 %
WBC: 4.8 10*3/uL (ref 3.8–10.8)

## 2022-05-14 LAB — COMPLETE METABOLIC PANEL WITH GFR
AG Ratio: 2 (calc) (ref 1.0–2.5)
ALT: 10 U/L (ref 6–29)
AST: 21 U/L (ref 10–35)
Albumin: 4.3 g/dL (ref 3.6–5.1)
Alkaline phosphatase (APISO): 108 U/L (ref 37–153)
BUN/Creatinine Ratio: 20 (calc) (ref 6–22)
BUN: 20 mg/dL (ref 7–25)
CO2: 35 mmol/L — ABNORMAL HIGH (ref 20–32)
Calcium: 9.2 mg/dL (ref 8.6–10.4)
Chloride: 103 mmol/L (ref 98–110)
Creat: 0.99 mg/dL — ABNORMAL HIGH (ref 0.60–0.95)
Globulin: 2.2 g/dL (calc) (ref 1.9–3.7)
Glucose, Bld: 79 mg/dL (ref 65–99)
Potassium: 4.1 mmol/L (ref 3.5–5.3)
Sodium: 144 mmol/L (ref 135–146)
Total Bilirubin: 0.4 mg/dL (ref 0.2–1.2)
Total Protein: 6.5 g/dL (ref 6.1–8.1)
eGFR: 53 mL/min/{1.73_m2} — ABNORMAL LOW (ref >=60)

## 2022-05-14 LAB — TSH: TSH: 2.01 mIU/L (ref 0.40–4.50)

## 2022-05-14 LAB — LIPID PANEL
Cholesterol: 175 mg/dL (ref <200)
HDL: 66 mg/dL (ref >=50)
LDL Cholesterol (Calc): 85 mg/dL (calc)
Non-HDL Cholesterol (Calc): 109 mg/dL (calc) (ref <130)
Total CHOL/HDL Ratio: 2.7 (calc) (ref <5.0)
Triglycerides: 140 mg/dL (ref <150)

## 2022-05-16 ENCOUNTER — Encounter: Payer: Medicare Other | Admitting: Nurse Practitioner

## 2022-05-30 ENCOUNTER — Non-Acute Institutional Stay: Payer: Medicare Other | Admitting: Nurse Practitioner

## 2022-05-30 ENCOUNTER — Encounter: Payer: Self-pay | Admitting: Nurse Practitioner

## 2022-05-30 DIAGNOSIS — N183 Chronic kidney disease, stage 3 unspecified: Secondary | ICD-10-CM

## 2022-05-30 DIAGNOSIS — I130 Hypertensive heart and chronic kidney disease with heart failure and stage 1 through stage 4 chronic kidney disease, or unspecified chronic kidney disease: Secondary | ICD-10-CM

## 2022-05-30 DIAGNOSIS — H3530 Unspecified macular degeneration: Secondary | ICD-10-CM | POA: Diagnosis not present

## 2022-05-30 DIAGNOSIS — R609 Edema, unspecified: Secondary | ICD-10-CM | POA: Diagnosis not present

## 2022-05-30 DIAGNOSIS — M7121 Synovial cyst of popliteal space [Baker], right knee: Secondary | ICD-10-CM

## 2022-05-30 DIAGNOSIS — E7849 Other hyperlipidemia: Secondary | ICD-10-CM | POA: Diagnosis not present

## 2022-05-30 DIAGNOSIS — I10 Essential (primary) hypertension: Secondary | ICD-10-CM | POA: Diagnosis not present

## 2022-05-30 DIAGNOSIS — J3802 Paralysis of vocal cords and larynx, bilateral: Secondary | ICD-10-CM | POA: Diagnosis not present

## 2022-05-30 DIAGNOSIS — M818 Other osteoporosis without current pathological fracture: Secondary | ICD-10-CM

## 2022-05-30 DIAGNOSIS — F418 Other specified anxiety disorders: Secondary | ICD-10-CM

## 2022-05-30 DIAGNOSIS — K219 Gastro-esophageal reflux disease without esophagitis: Secondary | ICD-10-CM | POA: Diagnosis not present

## 2022-05-30 NOTE — Assessment & Plan Note (Signed)
Stable,  takes Omeprazole, Hgb 12.8 05/14/22 

## 2022-05-30 NOTE — Assessment & Plan Note (Signed)
Blood pressure is controlled,  takes Losartan, ASA, Furosemide 10mg. Bun/creat 20/0.99 eGFR 53 05/14/22 

## 2022-05-30 NOTE — Assessment & Plan Note (Addendum)
More swelling RLE>LLE, no pain in the right calf or cord like findings. Wt gained about #4Ibs in the  past 6 months, denied SOB or cough, sleeps at 2 pillows as usual. Weight 1-2 x/wk in am ac breakfast, report if 2-3 Ibs per week or respiratory symptoms develop.  US venous RLE 4.1 x 0.6 x 2.1 cm fluid collection in the right posterior knee likely a Baker cyst. No right lower extremity DVT.

## 2022-05-30 NOTE — Assessment & Plan Note (Signed)
DEXA T score -4.3, declined Prolia, takes Vit D, Vit D level 40 05/17/20,  off Alendronate 2nd to jaw bone necrotic process 

## 2022-05-30 NOTE — Progress Notes (Addendum)
Location:   clinic [place]   Place of Service:  Clinic (12)clinic [facility] Provider: Marlana Latus NP  Code Status: DNR Goals of Care: [place]    05/30/2022    8:51 AM  Advanced Directives  Does Patient Have a Medical Advance Directive? Yes  Type of Paramedic of [place];Living will;Out of facility DNR (pink MOST or yellow form)  Does patient want to make changes to medical advance directive? No - Patient declined  Copy of [place] in Chart? Yes - validated most recent copy scanned in chart (See row information)     Chief Complaint  Patient presents with   Medical Management of Chronic Issues    Patient is here for a follow up for chronic conditions, Discuss need for shingrix, vaccines up to date [facility] verified     HPI: Patient is a 88 y.o. Angela [name] seen today for medical management of chronic diseases.    HTN, takes Losartan, ASA, Furosemide 58m. Bun/creat 20/0.99 eGFR 53 05/14/22             CKD Bun/creat 20/0.99 eGFR 53 7/11/2             Depression, takes Lexapro since 06/06/21, already on Mirtazapine 362mqd, TSH 2.01 05/14/22             Hyperlipidemia, takes Omega 3, Simvastatin. LDL 85 05/14/22             OP DEXA T score -4.3, declined Prolia, takes Vit D, Vit D level 40 05/17/20,  off Alendronate 2nd to jaw bone necrotic process             Vocal cord paralysis, underwent ENT evaluation in the past.              GERD, takes Omeprazole, Hgb 12.8 05/14/22  Glaucoma R+L follow Ophthalmology    Past Medical History:  Diagnosis Date   Anemia    Anxiety    Apnea 05/2000   sleep study revealed mild upper airway obstruction & apnea during speel, no set recommendations for therapy   Asthma    Diverticulosis    Dyspnea 12/15/09   spirometer normal & function actually worsens with nebk may have more of a "exercise-induced" type of bronchospasm    Fatigue 07/2015   GERD (gastroesophageal reflux disease)    Glaucoma    both eyes   Hyperlipidemia     Hypertension    Laryngeal paresis 09/12/2016   Loss of weight 06/20/2016   OSA (obstructive sleep apnea)    Osteoporosis, senile    Vocal cord dysfunction    per ENT, pt NOT to have elective intubation without disscussing with him    Past Surgical History:  Procedure Laterality Date   CATARACT EXTRACTION W/ INTRAOCULAR LENS  IMPLANT, BILATERAL  2011   Dr. EpCharise Killian CERVICAL CONIZATION W/BX     due to cervical dysplasia   COLONOSCOPY  02/2002   Diverticublosis    EYE SURGERY Right 02/2016   laser surgery for scar tissure   nasolaryngoscopy  2001   reflux laryngitis   SKIN CANCER EXCISION     ?BCC   TONSILLECTOMY     UPPER GI ENDOSCOPY  05/2000   upper airway edema/changes that were consistent with GERD   [facility] 2003   [facility]    Allergies  Allergen Reactions   Brimonidine Other (See Comments)   Dorzolamide Hcl Itching    EYE PAIN  Allergies as of 05/30/2022       Reactions   Brimonidine Other (See Comments)   Dorzolamide Hcl Itching   EYE PAIN        Medication List        Accurate as of May 30, 2022 11:59 PM. If you have any questions, ask your nurse or doctor.          ascorbic acid 500 MG tablet Commonly known as: VITAMIN C Take 500 mg by mouth daily.   aspirin EC 81 MG tablet Take 81 mg by mouth daily.   brinzolamide 1 % ophthalmic suspension Commonly known as: AZOPT Place 1 drop into both eyes 3 (three) times daily.   CALCIUM SOFT CHEWS PO Take by mouth.   escitalopram 5 MG tablet Commonly known as: LEXAPRO TAKE 1 TABLET (5 MG TOTAL) BY MOUTH DAILY.   Fish Oil 1000 MG Caps Take 1 capsule by mouth daily.   furosemide 20 MG tablet Commonly known as: LASIX TAKE 1/2 TABLET BY MOUTH DAILY   ketorolac 0.4 % Soln Commonly known as: ACULAR Place 1 drop into the left eye 4 (four) times daily.   losartan 25 MG tablet Commonly known as: COZAAR TAKE TWO TABLETS (50MG) BY MOUTH ONCE DAILY FOR BLOOD  PRESSURE   loteprednol 0.2 % Susp Commonly known as: LOTEMAX Place 1 drop into the left eye in the morning, at noon, in the evening, and at bedtime.   mirtazapine 30 MG tablet Commonly known as: REMERON TAKE 1 TABLET BY MOUTH EVERYDAY AT BEDTIME   Netarsudil Dimesylate 0.02 % Soln Apply 1 drop to eye at bedtime. Both eyes   simvastatin 10 MG tablet Commonly known as: ZOCOR Take 1 tablet (10 mg total) by mouth daily at 12 noon. OVERDUE FOR LABS   Timolol Maleate (Once-Daily) 0.5 % Soln Place 1 drop into both eyes 2 (two) times daily.        Review of Systems:  Review of Systems  Constitutional:  Negative for fatigue and fever.  HENT:  Positive for hearing loss and voice change. Negative for congestion.        Vocal cord paralysis.   Respiratory:  Negative for cough and wheezing.   Cardiovascular:  Positive for leg swelling.  Gastrointestinal:  Negative for abdominal pain and constipation.  Genitourinary:  Negative for dysuria and urgency.  Musculoskeletal:  Positive for arthralgias and gait problem.  Skin:  Negative for color change.  Neurological:  Negative for speech difficulty, weakness and light-headedness.       Memory lapses.   Psychiatric/Behavioral:  Negative for confusion and sleep disturbance. The patient is not nervous/anxious.     Health Maintenance  Topic Date Due   Zoster Vaccines- Shingrix (1 of 2) Never done   INFLUENZA VACCINE  06/04/2022   TETANUS/TDAP  08/07/2025   Pneumonia Vaccine 85+ Years old  Completed   DEXA SCAN  Completed   COVID-19 Vaccine  Completed   HPV VACCINES  Aged Out    Physical Exam: Vitals:   05/30/22 1358  BP: (!) 150/70  Pulse: 74  Resp: 16  Temp: (!) 96.9 F (36.1 C)  TempSrc: Temporal  SpO2: 97%  Weight: 123 lb (55.8 kg)  Height: 5' 4"  (1.626 m)   Body mass index is 21.11 kg/m. Physical Exam Vitals reviewed.  Constitutional:      Appearance: Normal appearance.  HENT:     Head: Normocephalic and atraumatic.      Nose: Nose normal.  Mouth/Throat:     Mouth: Mucous membranes are moist.     Comments: Torus plantinus Eyes:     Extraocular Movements: Extraocular movements intact.     Conjunctiva/sclera: Conjunctivae normal.     Pupils: Pupils are equal, round, and reactive to light.     Comments: Left lower eyelid entropion, Hx of left corneal ulceration, R+L glaucoma, able to read  Cardiovascular:     Rate and Rhythm: Normal rate and regular rhythm.     Heart sounds: No murmur heard. Pulmonary:     Effort: Pulmonary effort is normal.     Breath sounds: Wheezing present. No rales.     Comments: Anterior neck, sternal region wheezes, disappearing when breathing with mouth closed.   Abdominal:     Palpations: Abdomen is soft.     Tenderness: There is no abdominal tenderness.  Musculoskeletal:     Cervical back: Normal range of motion and neck supple.     Right lower leg: Edema present.     Left lower leg: Edema present.     Comments: Trace edema BLE  Skin:    General: Skin is warm and dry.  Neurological:     General: No focal deficit present.     Mental Status: She is alert. Mental status is at baseline.     Motor: No weakness.     Gait: Gait normal.     Comments: Oriented to person and place. Able to walk  Psychiatric:        Mood and Affect: Mood normal.        Behavior: Behavior normal.        Thought Content: Thought content normal.        Judgment: Judgment normal.     Labs reviewed: Basic Metabolic Panel: Recent Labs    11/06/21 0735 05/14/22 0700  NA 144 144  K 4.1 4.1  CL 104 103  CO2 35* 35*  GLUCOSE 98 79  BUN 18 20  CREATININE 1.03* 0.99*  CALCIUM 9.5 9.2  TSH  --  2.01   Liver Function Tests: Recent Labs    11/06/21 0735 05/14/22 0700  AST 21 21  ALT 9 10  BILITOT 0.4 0.4  PROT 6.6 6.5   No results for input(s): "LIPASE", "AMYLASE" in the last 8760 hours. No results for input(s): "AMMONIA" in the last 8760 hours. CBC: Recent Labs     11/06/21 0735 05/14/22 0700  WBC 4.6 4.8  NEUTROABS 2,236 2,789  HGB 13.1 12.8  HCT 40.1 40.5  MCV 94.4 95.3  PLT 188 211   Lipid Panel: Recent Labs    05/14/22 0700  CHOL 175  HDL 66  LDLCALC 85  TRIG 140  CHOLHDL 2.7   Lab Results  Component Value Date   HGBA1C 5.6 05/17/2020    Procedures since last visit: US Venous Img Lower Unilateral Right  Result Date: 06/03/2022 CLINICAL DATA:  Right leg swelling EXAM: RIGHT LOWER EXTREMITY VENOUS DOPPLER ULTRASOUND TECHNIQUE: Gray-scale sonography with compression, as well as color and duplex ultrasound, were performed to evaluate the deep venous system(s) from the level of the common femoral vein through the popliteal and proximal calf veins. COMPARISON:  None available FINDINGS: VENOUS Normal compressibility of the common femoral, superficial femoral, and popliteal veins, as well as the visualized calf veins. Visualized portions of profunda femoral vein and great saphenous vein unremarkable. No filling defects to suggest DVT on grayscale or color Doppler imaging. Doppler waveforms show normal direction of venous flow, normal  respiratory plasticity and response to augmentation. Limited views of the contralateral common femoral vein are unremarkable. OTHER 4.1 x 0.6 x 2.1 cm fluid collection in the right posterior knee likely a Baker cyst. Limitations: none IMPRESSION: No right lower extremity DVT. Electronically Signed   By: Miachel Roux M.D.   On: 06/03/2022 13:48    Assessment/Plan  HTN (hypertension) Blood pressure is controlled,  takes Losartan, ASA, Furosemide 55m. Bun/creat 20/0.99 eGFR 53 05/14/22  Benign hypertensive heart and CKD, stage 3 (GFR 30-59), w CHF ([facility] [place] Bun/creat 20/0.99 eGFR 53 7/11/2  Depression with anxiety Her mood is stable, takes Lexapro since 06/06/21, already on Mirtazapine 327mqd, TSH 2.01 05/14/22  Hyperlipidemia  takes Omega 3, Simvastatin. LDL 85 05/14/22  Age-related macular degeneration Follows  ophthalmology  Age-related osteoporosis without fracture DEXA T score -4.3, declined Prolia, takes Vit D, Vit D level 40 05/17/20,  off Alendronate 2nd to jaw bone necrotic process  Vocal fold paralysis, bilateral underwent ENT evaluation in the past.   GERD (gastroesophageal reflux disease) Stable, takes Omeprazole, Hgb 12.8 05/14/22  Edema More swelling RLE>LLE, no pain in the right calf or cord like findings. Wt gained about #4Ibs in the  past 6 months, denied SOB or cough, sleeps at 2 pillows as usual. Weight 1-2 x/wk in am ac breakfast, report if 2-3 Ibs per week or respiratory symptoms develop.  [place]ous RLE 4.1 x 0.6 x 2.1 cm fluid collection in the right posterior knee likely a Baker cyst. No right lower extremity DVT.  Baker cyst, right [place]ous RLE 4.1 x 0.6 x 2.1 cm fluid collection in the right posterior knee likely a Baker cyst. No right lower extremity DVT. Orthopedic referral.     Labs/tests ordered: venous [place]LE  Next appt: 3 months.

## 2022-05-30 NOTE — Assessment & Plan Note (Signed)
Her mood is stable, takes Lexapro since 06/06/21, already on Mirtazapine '30mg'$  qd, TSH 2.01 05/14/22

## 2022-05-30 NOTE — Assessment & Plan Note (Signed)
[facility]

## 2022-05-30 NOTE — Assessment & Plan Note (Signed)
takes Omega 3, Simvastatin. LDL 85 05/14/22 

## 2022-05-30 NOTE — Assessment & Plan Note (Signed)
Bun/creat 20/0.99 eGFR 53 7/11/2

## 2022-05-30 NOTE — Assessment & Plan Note (Signed)
underwent ENT evaluation in the past.  

## 2022-05-31 ENCOUNTER — Telehealth: Payer: Self-pay | Admitting: *Deleted

## 2022-05-31 DIAGNOSIS — R600 Localized edema: Secondary | ICD-10-CM

## 2022-05-31 NOTE — Telephone Encounter (Signed)
Mast, Man X, NP  You 11 minutes ago (11:17 AM)   Go ahead with an order of venous US to r/o blood clots, thanks.      Daughter notified. Right Leg.  Order Placed and Pended and sent to [facility] for approval.  Brother will be taking patient [phone] Quillian Quince.

## 2022-05-31 NOTE — Telephone Encounter (Signed)
Angela [name], daughter called and stated that patient was seen in office yesterday at [facility]. Daughter is  Worried about a blood clot and wants to know why patient wasn't sent for testing if you thought she [name] have a blood clot.   Daughter stated that the swelling was better today. Stated that patient's son was with patient yesterday at appointment and stated that you had concern for blood clot.    Please Advise.

## 2022-06-03 ENCOUNTER — Ambulatory Visit
Admission: RE | Admit: 2022-06-03 | Discharge: 2022-06-03 | Disposition: A | Discharge status: Home / Self Care | Payer: Medicare Other | Source: Ambulatory Visit | Attending: Nurse Practitioner | Admitting: Nurse Practitioner

## 2022-06-03 DIAGNOSIS — M7989 Other specified soft tissue disorders: Secondary | ICD-10-CM | POA: Diagnosis not present

## 2022-06-03 DIAGNOSIS — R600 Localized edema: Secondary | ICD-10-CM

## 2022-06-04 ENCOUNTER — Telehealth: Payer: Self-pay | Admitting: *Deleted

## 2022-06-04 DIAGNOSIS — M7121 Synovial cyst of popliteal space [Baker], right knee: Secondary | ICD-10-CM

## 2022-06-04 HISTORY — DX: Synovial cyst of popliteal space (Baker), right knee: M71.21

## 2022-06-04 NOTE — Addendum Note (Signed)
Addended by: Teneka Malmberg X on: 06/04/2022 12:59 PM   Modules accepted: Orders

## 2022-06-04 NOTE — Assessment & Plan Note (Addendum)
US venous RLE 4.1 x 0.6 x 2.1 cm fluid collection in the right posterior knee likely a Baker cyst. No right lower extremity DVT. Orthopedic referral.

## 2022-06-04 NOTE — Telephone Encounter (Signed)
Angela [name], daughter called requesting for you to call them with patient's Ultrasound results she had done yesterday.   Requesting you to call her at [phone]

## 2022-06-04 NOTE — Telephone Encounter (Signed)
Mast, Man X, NP  You 15 minutes ago (1:01 PM)   Reviewed RLE venous US result with [name]. Orthopedic referral made for the right knee pain. Thanks.     Mast, Man X, NP  You 27 minutes ago (12:49 PM)    I will call her.

## 2022-06-06 ENCOUNTER — Other Ambulatory Visit: Payer: Self-pay | Admitting: Nurse Practitioner

## 2022-06-06 DIAGNOSIS — E7849 Other hyperlipidemia: Secondary | ICD-10-CM

## 2022-06-11 ENCOUNTER — Ambulatory Visit (INDEPENDENT_AMBULATORY_CARE_PROVIDER_SITE_OTHER): Payer: Medicare Other

## 2022-06-11 ENCOUNTER — Ambulatory Visit (INDEPENDENT_AMBULATORY_CARE_PROVIDER_SITE_OTHER): Payer: Medicare Other | Admitting: Orthopaedic Surgery

## 2022-06-11 DIAGNOSIS — M1711 Unilateral primary osteoarthritis, right knee: Secondary | ICD-10-CM

## 2022-06-11 NOTE — Progress Notes (Unsigned)
Office Visit Note   Patient: Angela [name]           Date of Birth: 04-02-1927           MRN: [id number] Visit Date: 06/11/2022              Requested by: Mast, Man X, NP [id number],  [place] [id number] PCP: Mast, Man X, NP   Assessment & Plan: Visit Diagnoses:  1. Unilateral primary osteoarthritis, right knee     Plan: Impression is right knee arthritis with subsequent Baker's cyst.  Today, I discussed with patient that the Baker's cyst is the effect of something going on within the knee which is likely in her case the underlying arthritis.  I discussed that we could proceed with intra-articular cortisone injection in hopes of settling this down.  She is currently not interested in cortisone injection but will follow-up with [place] if she changes her mind.  Call with concerns or questions in the meantime.  Follow-Up Instructions: Return if symptoms worsen or fail to improve.   Orders:  Orders Placed This Encounter  Procedures   XR KNEE 3 VIEW RIGHT   No orders of the defined types were placed in this encounter.     Procedures: No procedures performed   Clinical Data: No additional findings.   Subjective: Chief Complaint  Patient presents with   Right Knee - Pain    HPI patient is a pleasant 88 year old female who comes in today with right knee pain.  This is been ongoing for years.  She denies any injury or change in activity.  She is here primarily today because of incidental finding of a Baker's cyst was found during an ultrasound of the right lower extremity.  She lives at friend's home and is fairly low demand.  The pain she has been having is to the entire knee worse with activity.  She has been using various rubs without significant relief.  Review of Systems as detailed in HPI.  All others reviewed are negative.   Objective: Vital Signs: There were no vitals taken for this visit.  Physical Exam well-developed well-nourished female no acute distress.   Alert and oriented x 3.  Ortho Exam right knee exam shows trace effusion.  Range of motion 0 to 110 degrees.  No joint line tenderness.  Mild patellofemoral crepitus.  She is neurovascular intact distally.  Specialty Comments:  No specialty comments available.  Imaging: XR KNEE 3 VIEW RIGHT  Result Date: 06/11/2022 X-rays demonstrate advanced degenerative changes to the medial patellofemoral compartments    PMFS History: Patient Active Problem List   Diagnosis Date Noted   Baker cyst, right 06/04/2022   Edema 05/30/2022   CKD (chronic kidney disease) stage 3, GFR 30-59 ml/min (HCC) 05/11/2021   Benign hypertensive heart and CKD, stage 3 (GFR 30-59), w CHF ([place]) 03/18/2019   Entropion eyelid, left 03/18/2019   Primary open angle glaucoma (POAG) of both eyes 04/21/2018   Chronic depression 04/21/2018   Dry eyes 04/21/2018   Anxiety 12/08/2015   Age-related osteoporosis without fracture 09/22/2015   GERD (gastroesophageal reflux disease) 09/22/2015   Obstructive sleep apnea 09/22/2015   Reflux laryngitis 09/22/2015   Depression with anxiety 09/22/2015   HTN (hypertension) 09/22/2015   Osteoarthritis 09/22/2015   Balance problem 09/22/2015   Dizziness and giddiness 09/22/2015   Hyperlipidemia 09/22/2015   Allergic rhinitis 09/22/2015   Vocal fold paralysis, bilateral 09/22/2015   Cloudy posterior capsule 08/17/2014  Entropion of lower eyelid 02/21/2014   Exposure keratitis 12/26/2013   Entropion, spastic 12/26/2013   Myogenic ptosis 11/11/2013   Blepharoptosis 10/12/2013   Age-related macular degeneration 06/03/2012   Blepharitis 06/03/2012   Pseudoaphakia 06/03/2012   H/O nonmelanoma skin cancer 06/03/2012   H/O vocal cord paralysis 06/03/2012   Past Medical History:  Diagnosis Date   Anemia    Anxiety    Apnea 05/2000   sleep study revealed mild upper airway obstruction & apnea during speel, no set recommendations for therapy   Asthma    Diverticulosis    Dyspnea  12/15/09   spirometer normal & function actually worsens with nebk may have more of a "exercise-induced" type of bronchospasm    Fatigue 07/2015   GERD (gastroesophageal reflux disease)    Glaucoma    both eyes   Hyperlipidemia    Hypertension    Laryngeal paresis 09/12/2016   Loss of weight 06/20/2016   OSA (obstructive sleep apnea)    Osteoporosis, senile    Vocal cord dysfunction    per ENT, pt NOT to have elective intubation without disscussing with him    Family History  Problem Relation Age of Onset   Melanoma Mother    Transient ischemic attack Father    Multiple sclerosis Sister    Cancer Brother    Colon cancer Neg Hx     Past Surgical History:  Procedure Laterality Date   CATARACT EXTRACTION W/ INTRAOCULAR LENS  IMPLANT, BILATERAL  2011   Dr. Charise Killian   CERVICAL CONIZATION W/BX     due to cervical dysplasia   COLONOSCOPY  02/2002   Diverticublosis    EYE SURGERY Right 02/2016   laser surgery for scar tissure   nasolaryngoscopy  2001   reflux laryngitis   SKIN CANCER EXCISION     ?[place]   TONSILLECTOMY     UPPER GI ENDOSCOPY  05/2000   upper airway edema/changes that were consistent with GERD   vocal fold cordotomy Left 2003   Dr. Denyse Dago [facility]   Social History   Occupational History   Occupation: Retired  Tobacco Use   Smoking status: Never   Smokeless tobacco: Current    Types: Chew, Snuff  Vaping Use   Vaping Use: Never used  Substance and Sexual Activity   Alcohol use: No   Drug use: No   Sexual activity: Never

## 2022-07-24 DIAGNOSIS — H35372 Puckering of macula, left eye: Secondary | ICD-10-CM | POA: Diagnosis not present

## 2022-07-24 DIAGNOSIS — H35352 Cystoid macular degeneration, left eye: Secondary | ICD-10-CM | POA: Diagnosis not present

## 2022-07-29 DIAGNOSIS — H401113 Primary open-angle glaucoma, right eye, severe stage: Secondary | ICD-10-CM | POA: Diagnosis not present

## 2022-07-29 DIAGNOSIS — H401122 Primary open-angle glaucoma, left eye, moderate stage: Secondary | ICD-10-CM | POA: Diagnosis not present

## 2022-07-29 DIAGNOSIS — Z961 Presence of intraocular lens: Secondary | ICD-10-CM | POA: Diagnosis not present

## 2022-08-20 DIAGNOSIS — D485 Neoplasm of uncertain behavior of skin: Secondary | ICD-10-CM | POA: Diagnosis not present

## 2022-08-20 DIAGNOSIS — Z23 Encounter for immunization: Secondary | ICD-10-CM | POA: Diagnosis not present

## 2022-08-20 DIAGNOSIS — L821 Other seborrheic keratosis: Secondary | ICD-10-CM | POA: Diagnosis not present

## 2022-08-20 DIAGNOSIS — L218 Other seborrheic dermatitis: Secondary | ICD-10-CM | POA: Diagnosis not present

## 2022-08-20 DIAGNOSIS — L820 Inflamed seborrheic keratosis: Secondary | ICD-10-CM | POA: Diagnosis not present

## 2022-08-29 ENCOUNTER — Encounter: Payer: Medicare Other | Admitting: Nurse Practitioner

## 2022-08-30 NOTE — Progress Notes (Signed)
This encounter was created in error - please disregard.

## 2022-09-02 ENCOUNTER — Other Ambulatory Visit: Payer: Self-pay | Admitting: Nurse Practitioner

## 2022-09-02 NOTE — Telephone Encounter (Signed)
Pharmacy requested refill.  Pended Rx and sent to [facility] for approval due to [place].

## 2022-09-12 ENCOUNTER — Encounter: Payer: Self-pay | Admitting: Nurse Practitioner

## 2022-09-12 ENCOUNTER — Non-Acute Institutional Stay: Payer: Medicare Other | Admitting: Nurse Practitioner

## 2022-09-12 VITALS — BP 142/70 | HR 78 | Temp 97.1°F | Resp 18 | Ht 63.0 in | Wt 120.0 lb

## 2022-09-12 DIAGNOSIS — I10 Essential (primary) hypertension: Secondary | ICD-10-CM | POA: Diagnosis not present

## 2022-09-12 DIAGNOSIS — E7849 Other hyperlipidemia: Secondary | ICD-10-CM

## 2022-09-12 DIAGNOSIS — N183 Chronic kidney disease, stage 3 unspecified: Secondary | ICD-10-CM

## 2022-09-12 DIAGNOSIS — J3802 Paralysis of vocal cords and larynx, bilateral: Secondary | ICD-10-CM | POA: Diagnosis not present

## 2022-09-12 DIAGNOSIS — I130 Hypertensive heart and chronic kidney disease with heart failure and stage 1 through stage 4 chronic kidney disease, or unspecified chronic kidney disease: Secondary | ICD-10-CM | POA: Diagnosis not present

## 2022-09-12 DIAGNOSIS — M818 Other osteoporosis without current pathological fracture: Secondary | ICD-10-CM | POA: Diagnosis not present

## 2022-09-12 DIAGNOSIS — H3530 Unspecified macular degeneration: Secondary | ICD-10-CM | POA: Diagnosis not present

## 2022-09-12 DIAGNOSIS — K219 Gastro-esophageal reflux disease without esophagitis: Secondary | ICD-10-CM | POA: Diagnosis not present

## 2022-09-12 DIAGNOSIS — F418 Other specified anxiety disorders: Secondary | ICD-10-CM | POA: Diagnosis not present

## 2022-09-12 NOTE — Assessment & Plan Note (Signed)
DEXA T score -4.3, declined Prolia, takes Vit D, Vit D level 40 05/17/20,  off Alendronate 2nd to jaw bone necrotic process

## 2022-09-12 NOTE — Assessment & Plan Note (Signed)
Bun/creat 20/0.99 eGFR 53 05/14/22 

## 2022-09-12 NOTE — Assessment & Plan Note (Signed)
takes Omega 3, Simvastatin. LDL 85 05/14/22

## 2022-09-12 NOTE — Assessment & Plan Note (Addendum)
Mood is stable, takes Lexapro since 06/06/21, already on Mirtazapine '30mg'$  qd, TSH 2.01 05/14/22, anxious episodes, increase Lxapro '10mg'$ . Obsreve .

## 2022-09-12 NOTE — Assessment & Plan Note (Signed)
underwent ENT evaluation in the past.

## 2022-09-12 NOTE — Assessment & Plan Note (Signed)
takes Losartan, ASA, Furosemide 58m. Bun/creat 20/0.99 eGFR 53 05/14/22

## 2022-09-12 NOTE — Assessment & Plan Note (Signed)
Stable,  takes Omeprazole, Hgb 12.8 05/14/22

## 2022-09-12 NOTE — Progress Notes (Signed)
Location:   clinic [place]   Place of Service:  Clinic (12) Provider: Marlana Latus NP  Code Status: DNR Goals of Care: [place]    09/12/2022    9:22 AM  Advanced Directives  Does Patient Have a Medical Advance Directive? Yes  Type of Paramedic of [place];Living will  Does patient want to make changes to medical advance directive? No - Patient declined  Copy of [place] in Chart? Yes - validated most recent copy scanned in chart (See row information)     Chief Complaint  Patient presents with   Medical Management of Chronic Issues    Patient is here for a follow up for chronic conditions    Quality Metric Gaps    Patient is also due for AWV and updated flu vaccine    HPI: Patient is a 88 y.o. female seen today for medical management of chronic diseases.       HTN, takes Losartan, ASA, Furosemide 56m. Bun/creat 20/0.99 eGFR 53 05/14/22             CKD Bun/creat 20/0.99 eGFR 53 05/14/22             Depression, takes Lexapro since 06/06/21, already on Mirtazapine 373mqd, TSH 2.01 05/14/22             Hyperlipidemia, takes Omega 3, Simvastatin. LDL 85 05/14/22             OP DEXA T score -4.3, declined Prolia, takes Vit D, Vit D level 40 05/17/20,  off Alendronate 2nd to jaw bone necrotic process             Vocal cord paralysis, underwent ENT evaluation in the past.              GERD, takes Omeprazole, Hgb 12.8 05/14/22             Glaucoma R+L follow Ophthalmology  Past Medical History:  Diagnosis Date   Anemia    Anxiety    Apnea 05/2000   sleep study revealed mild upper airway obstruction & apnea during speel, no set recommendations for therapy   Asthma    Diverticulosis    Dyspnea 12/15/09   spirometer normal & function actually worsens with nebk may have more of a "exercise-induced" type of bronchospasm    Fatigue 07/2015   GERD (gastroesophageal reflux disease)    Glaucoma    both eyes   Hyperlipidemia    Hypertension    Laryngeal  paresis 09/12/2016   Loss of weight 06/20/2016   OSA (obstructive sleep apnea)    Osteoporosis, senile    Vocal cord dysfunction    per ENT, pt NOT to have elective intubation without disscussing with him    Past Surgical History:  Procedure Laterality Date   CATARACT EXTRACTION W/ INTRAOCULAR LENS  IMPLANT, BILATERAL  2011   Dr. EpCharise Killian CERVICAL CONIZATION W/BX     due to cervical dysplasia   COLONOSCOPY  02/2002   Diverticublosis    EYE SURGERY Right 02/2016   laser surgery for scar tissure   nasolaryngoscopy  2001   reflux laryngitis   SKIN CANCER EXCISION     ?[facility]   TONSILLECTOMY     UPPER GI ENDOSCOPY  05/2000   upper airway edema/changes that were consistent with GERD   vocal [facility] 2003   [facility]  Allergies  Allergen Reactions   Brimonidine Other (See Comments)  Dorzolamide Hcl Itching    EYE PAIN    Allergies as of 09/12/2022       Reactions   Brimonidine Other (See Comments)   Dorzolamide Hcl Itching   EYE PAIN        Medication List        Accurate as of September 12, 2022 11:59 PM. If you have any questions, ask your nurse or doctor.          ascorbic acid 500 MG tablet Commonly known as: VITAMIN C Take 500 mg by mouth daily.   aspirin EC 81 MG tablet Take 81 mg by mouth daily.   brinzolamide 1 % ophthalmic suspension Commonly known as: AZOPT Place 1 drop into both eyes 3 (three) times daily.   CALCIUM SOFT CHEWS PO Take by mouth.   escitalopram 10 MG tablet Commonly known as: Lexapro Take 1 tablet (10 mg total) by mouth daily. What changed:  medication strength how much to take Changed by: Brodi Kari X Kallen Mccrystal, NP   Fish Oil 1000 MG Caps Take 1 capsule by mouth daily.   furosemide 20 MG tablet Commonly known as: LASIX TAKE 1/2 TABLET BY MOUTH EVERY DAY   ketorolac 0.4 % Soln Commonly known as: ACULAR Place 1 drop into the left eye 4 (four) times daily.   losartan 25 MG tablet Commonly known as:  COZAAR TAKE TWO TABLETS (50MG) BY MOUTH ONCE DAILY FOR BLOOD PRESSURE   loteprednol 0.2 % Susp Commonly known as: LOTEMAX Place 1 drop into the left eye in the morning, at noon, in the evening, and at bedtime.   mirtazapine 30 MG tablet Commonly known as: REMERON TAKE 1 TABLET BY MOUTH EVERYDAY AT BEDTIME   Netarsudil Dimesylate 0.02 % Soln Apply 1 drop to eye at bedtime. Both eyes   simvastatin 10 MG tablet Commonly known as: ZOCOR TAKE 1 TABLET (10 MG TOTAL) BY MOUTH DAILY AT 12 NOON. OVERDUE FOR LABS   Timolol Maleate (Once-Daily) 0.5 % Soln Place 1 drop into both eyes 2 (two) times daily.        Review of Systems:  Review of Systems  Constitutional:  Negative for fatigue and fever.  HENT:  Positive for hearing loss and voice change. Negative for congestion.        Vocal cord paralysis.   Respiratory:  Negative for cough and wheezing.   Cardiovascular:  Positive for leg swelling.  Gastrointestinal:  Negative for abdominal pain and constipation.  Genitourinary:  Negative for dysuria and urgency.  Musculoskeletal:  Positive for arthralgias and gait problem.  Skin:  Negative for color change.  Neurological:  Negative for speech difficulty, weakness and light-headedness.       Memory lapses.   Psychiatric/Behavioral:  Positive for sleep disturbance. Negative for confusion. The patient is nervous/anxious.     Health Maintenance  Topic Date Due   Zoster Vaccines- Shingrix (1 of 2) Never done   Medicare Annual Wellness (AWV)  10/06/2019   INFLUENZA VACCINE  06/04/2022   TETANUS/TDAP  08/07/2025   Pneumonia Vaccine 12+ Years old  Completed   DEXA SCAN  Completed   COVID-19 Vaccine  Completed   HPV VACCINES  Aged Out    Physical Exam: Vitals:   09/12/22 1411  BP: (!) 142/70  Pulse: 78  Resp: 18  Temp: (!) 97.1 F (36.2 C)  SpO2: 94%  Weight: 120 lb (54.4 kg)  Height: _0  (1.6 m)   Body mass index is 21.26 kg/m. Physical Exam Vitals reviewed.  Constitutional:      Appearance: Normal appearance.  HENT:     Head: Normocephalic and atraumatic.     Nose: Nose normal.     Mouth/Throat:     Mouth: Mucous membranes are moist.     Comments: Torus plantinus Eyes:     Extraocular Movements: Extraocular movements intact.     Conjunctiva/sclera: Conjunctivae normal.     Pupils: Pupils are equal, round, and reactive to light.     Comments: Left lower eyelid entropion, Hx of left corneal ulceration, R+L glaucoma, able to read  Cardiovascular:     Rate and Rhythm: Normal rate and regular rhythm.     Heart sounds: No murmur heard. Pulmonary:     Effort: Pulmonary effort is normal.     Breath sounds: Wheezing present. No rales.     Comments: Anterior neck, chronic sternal region wheezes, disappearing when breathing with mouth closed.   Abdominal:     Palpations: Abdomen is soft.     Tenderness: There is no abdominal tenderness.  Musculoskeletal:     Cervical back: Normal range of motion and neck supple.     Right lower leg: Edema present.     Left lower leg: Edema present.     Comments: Trace edema BLE  Skin:    General: Skin is warm and dry.  Neurological:     General: No focal deficit present.     Mental Status: She is alert. Mental status is at baseline.     Motor: No weakness.     Gait: Gait normal.     Comments: Oriented to person and place. Able to walk  Psychiatric:        Mood and Affect: Mood normal.        Behavior: Behavior normal.        Thought Content: Thought content normal.        Judgment: Judgment normal.     Labs reviewed: Basic Metabolic Panel: Recent Labs    11/06/21 0735 05/14/22 0700  NA 144 144  K 4.1 4.1  CL 104 103  CO2 35* 35*  GLUCOSE 98 79  BUN 18 20  CREATININE 1.03* 0.99*  CALCIUM 9.5 9.2  TSH  --  2.01   Liver Function Tests: Recent Labs    11/06/21 0735 05/14/22 0700  AST 21 21  ALT 9 10  BILITOT 0.4 0.4  PROT 6.6 6.5   No results for input(s): "LIPASE", "AMYLASE" in  the last 8760 hours. No results for input(s): "AMMONIA" in the last 8760 hours. CBC: Recent Labs    11/06/21 0735 05/14/22 0700  WBC 4.6 4.8  NEUTROABS 2,236 2,789  HGB 13.1 12.8  HCT 40.1 40.5  MCV 94.4 95.3  PLT 188 211   Lipid Panel: Recent Labs    05/14/22 0700  CHOL 175  HDL 66  LDLCALC 85  TRIG 140  CHOLHDL 2.7   Lab Results  Component Value Date   HGBA1C 5.6 05/17/2020    Procedures since last visit: No results found.  Assessment/Plan  HTN (hypertension) takes Losartan, ASA, Furosemide 91m. Bun/creat 20/0.99 eGFR 53 05/14/22  Benign hypertensive heart and CKD, stage 3 (GFR 30-59), [place]un/creat 20/0.99 eGFR 53 05/14/22  Depression with anxiety Mood is stable, takes Lexapro since 06/06/21, already on Mirtazapine 346mqd, TSH 2.01 05/14/22, anxious episodes, increase Lxapro 1032mObsreve .  Hyperlipidemia  takes Omega 3, Simvastatin. LDL 85 05/14/22  Age-related osteoporosis without fracture DEXA T score -4.3, declined Prolia,  takes Vit D, Vit D level 40 05/17/20,  off Alendronate 2nd to jaw bone necrotic process  Vocal fold paralysis, bilateral underwent ENT evaluation in the past.   GERD (gastroesophageal reflux disease) Stable,  takes Omeprazole, Hgb 12.8 05/14/22  Age-related macular degeneration Both eyes, followed by Ophthalmology.    Labs/tests ordered: none  Next appt:  3 months. AWV 2 weeks.

## 2022-09-12 NOTE — Assessment & Plan Note (Signed)
Both eyes, followed by Ophthalmology.

## 2022-09-13 ENCOUNTER — Encounter: Payer: Self-pay | Admitting: Nurse Practitioner

## 2022-09-13 MED ORDER — ESCITALOPRAM OXALATE 10 MG PO TABS: 10.0000 mg | ORAL_TABLET | Freq: Every day | ORAL | 2 refills | Status: DC

## 2022-09-18 DIAGNOSIS — H35352 Cystoid macular degeneration, left eye: Secondary | ICD-10-CM | POA: Diagnosis not present

## 2022-10-03 ENCOUNTER — Encounter: Payer: Self-pay | Admitting: Nurse Practitioner

## 2022-10-03 ENCOUNTER — Other Ambulatory Visit: Payer: Self-pay | Admitting: Nurse Practitioner

## 2022-10-03 ENCOUNTER — Ambulatory Visit (INDEPENDENT_AMBULATORY_CARE_PROVIDER_SITE_OTHER): Payer: Medicare Other | Admitting: Nurse Practitioner

## 2022-10-03 VITALS — BP 120/60 | HR 84 | Temp 97.7°F | Resp 18 | Ht 63.0 in | Wt 120.0 lb

## 2022-10-03 DIAGNOSIS — Z0000 Encounter for general adult medical examination without abnormal findings: Secondary | ICD-10-CM | POA: Diagnosis not present

## 2022-10-03 MED ORDER — MIRTAZAPINE 30 MG PO TABS: ORAL_TABLET | ORAL | 1 refills | Status: DC

## 2022-10-03 NOTE — Progress Notes (Signed)
Subjective:   Angela [name] is a 88 y.o. female who presents for Medicare Annual (Subsequent) preventive examination in the clinic [facility]      Objective:    Today's Vitals   10/03/22 1323  BP: 120/60  Pulse: 84  Resp: 18  Temp: 97.7 F (36.5 C)  SpO2: 97%  Weight: 120 lb (54.4 kg)  Height: '5\' 3"'$  (1.6 m)   Body mass index is 21.26 kg/m.     10/03/2022    9:05 AM 09/12/2022    9:22 AM 05/30/2022    8:51 AM 08/07/2021    1:35 PM 06/06/2021    1:49 PM 08/06/2019    9:21 AM 03/18/2019   12:58 PM  Advanced Directives  Does Patient Have a Medical Advance Directive? Yes Yes Yes Yes Yes No;Yes No  Type of Paramedic of [place];Living will [place];Living will [place];Living will;Out of facility DNR (pink MOST or yellow form) [place];Living will Living will    Does patient want to make changes to medical advance directive? No - Patient declined No - Patient declined No - Patient declined No - Patient declined No - Patient declined    Copy of [place] in Chart? Yes - validated most recent copy scanned in chart (See row information) Yes - validated most recent copy scanned in chart (See row information) Yes - validated most recent copy scanned in chart (See row information) Yes - validated most recent copy scanned in chart (See row information)     Would patient like information on creating a medical advance directive?       No - Patient declined    Current Medications (verified) Outpatient Encounter Medications as of 10/03/2022  Medication Sig   aspirin EC 81 MG tablet Take 81 mg by mouth daily.   brinzolamide (AZOPT) 1 % ophthalmic suspension Place 1 drop into both eyes 3 (three) times daily.    Calcium-Vitamin D-Vitamin K (CALCIUM SOFT CHEWS PO) Take by mouth.   escitalopram (LEXAPRO) 10 MG tablet Take 1 tablet (10 mg total) by mouth daily.   furosemide (LASIX) 20 MG tablet TAKE 1/2  TABLET BY MOUTH EVERY DAY   ketorolac (ACULAR) 0.4 % SOLN Place 1 drop into the left eye 4 (four) times daily.   losartan (COZAAR) 25 MG tablet TAKE TWO TABLETS ('50MG'$ ) BY MOUTH ONCE DAILY FOR BLOOD PRESSURE   loteprednol (LOTEMAX) 0.2 % SUSP Place 1 drop into the left eye in the morning, at noon, in the evening, and at bedtime.   mirtazapine (REMERON) 30 MG tablet TAKE 1 TABLET BY MOUTH EVERYDAY AT BEDTIME   Netarsudil Dimesylate 0.02 % SOLN Apply 1 drop to eye at bedtime. Both eyes   Omega-3 Fatty Acids (FISH OIL) 1000 MG CAPS Take 1 capsule by mouth daily.    simvastatin (ZOCOR) 10 MG tablet TAKE 1 TABLET (10 MG TOTAL) BY MOUTH DAILY AT 12 NOON. OVERDUE FOR LABS   Timolol Maleate 0.5 % (DAILY) SOLN Place 1 drop into both eyes 2 (two) times daily.    vitamin C (ASCORBIC ACID) 500 MG tablet Take 500 mg by mouth daily.   No facility-administered encounter medications on file as of 10/03/2022.    Allergies (verified) Brimonidine and Dorzolamide hcl   History: Past Medical History:  Diagnosis Date   Anemia    Anxiety    Apnea 05/2000   sleep study revealed mild upper airway obstruction & apnea during speel,  no set recommendations for therapy   Asthma    Diverticulosis    Dyspnea 12/15/09   spirometer normal & function actually worsens with nebk may have more of a "exercise-induced" type of bronchospasm    Fatigue 07/2015   GERD (gastroesophageal reflux disease)    Glaucoma    both eyes   Hyperlipidemia    Hypertension    Laryngeal paresis 09/12/2016   Loss of weight 06/20/2016   OSA (obstructive sleep apnea)    Osteoporosis, senile    Vocal cord dysfunction    per ENT, pt NOT to have elective intubation without disscussing with him   Past Surgical History:  Procedure Laterality Date   CATARACT EXTRACTION W/ INTRAOCULAR LENS  IMPLANT, BILATERAL  2011   Dr. Charise Killian   CERVICAL CONIZATION W/BX     due to cervical dysplasia   COLONOSCOPY  02/2002   Diverticublosis    EYE SURGERY Right  02/2016   laser surgery for scar tissure   nasolaryngoscopy  2001   reflux laryngitis   SKIN CANCER EXCISION     ?[facility]   TONSILLECTOMY     UPPER GI ENDOSCOPY  05/2000   upper airway edema/changes that were consistent with GERD   vocal fold cordotomy Left 2003   Dr. Denyse Dago [facility]   Family History  Problem Relation Age of Onset   Melanoma Mother    Transient ischemic attack Father    Multiple sclerosis Sister    Cancer Brother    Colon cancer Neg Hx    Social History   Socioeconomic History   Marital status: Widowed    Spouse name: Not on file   Number of children: 3   Years of education: Not on file   Highest education level: Not on file  Occupational History   Occupation: Retired  Tobacco Use   Smoking status: Never   Smokeless tobacco: Current    Types: Chew, Snuff  Vaping Use   Vaping Use: Never used  Substance and Sexual Activity   Alcohol use: No   Drug use: No   Sexual activity: Never  Other Topics Concern   Not on file  Social History Narrative   Lives at [facility]   Widowed   Previously smoked stopped 1986   Alcohol none   Caffeine occasionally   Exercise walk   Social Determinants of Health   Financial Resource Strain: Medium Risk (10/03/2017)   Overall Financial Resource Strain (CARDIA)    Difficulty of Paying Living Expenses: Somewhat hard  Food Insecurity: No Food Insecurity (10/03/2017)   Hunger Vital Sign    Worried About Running Out of Food in the Last Year: Never true    Ran Out of Food in the Last Year: Never true  Transportation Needs: No Transportation Needs (10/03/2017)   PRAPARE - Hydrologist (Medical): No    Lack of Transportation (Non-Medical): No  Physical Activity: Insufficiently Active (10/03/2017)   Exercise Vital Sign    Days of Exercise per Week: 3 days    Minutes of Exercise per Session: 30 min  Stress: Stress Concern Present (10/05/2018)   [place]    Feeling of Stress : Rather much  Social Connections: Somewhat Isolated (10/03/2017)   Social Connection and Isolation Panel [NHANES]    Frequency of Communication with Friends and Family: More than three times a week    Frequency of Social Gatherings with Friends and Family: More  than three times a week    Attends Religious Services: 1 to 4 times per year    Active Member of Clubs or Organizations: No    Attends Archivist Meetings: Never    Marital Status: Widowed    Tobacco Counseling Ready to quit: Not Answered Counseling given: Not Answered   Clinical Intake:  Pre-visit preparation completed: Yes  Pain : No/denies pain     BMI - recorded: 21.26 Nutritional Status: BMI of 19-24  Normal Nutritional Risks: None Diabetes: No  How often do you need to have someone help you when you read instructions, pamphlets, or other written materials from your doctor or pharmacy?: 1 - Never What is the last grade level you completed in school?: high school  Diabetic? no  Interpreter Needed?: No      Activities of Daily Living    10/03/2022    1:38 PM  In your present state of health, do you have any difficulty performing the following activities:  Hearing? 1  Vision? 0  Difficulty concentrating or making decisions? 0  Walking or climbing stairs? 1  Dressing or bathing? 0  Doing errands, shopping? 0  Preparing Food and eating ? N  Using the Toilet? N  In the past six months, have you accidently leaked urine? Y  Do you have problems with loss of bowel control? N  Managing your Medications? N  Managing your Finances? Y  Housekeeping or managing your Housekeeping? Y    Patient Care Team: Xiara Knisley X, NP as PCP - General (Internal Medicine) Moya, Carlisle Beers, MD as Referring Physician (Ophthalmology) Jarome Matin, MD as Consulting Physician (Dermatology) Ernestine Conrad, MD as Consulting Physician  (Otolaryngology) Caylon Saine X, NP as Nurse Practitioner (Internal Medicine)  Indicate any recent Medical Services you may have received from other than [facility] providers in the past year (date may be approximate).     Assessment:   This is a routine wellness examination for Nithila.  Hearing/Vision screen No results found.  Dietary issues and exercise activities discussed: Current Exercise Habits: The patient does not participate in regular exercise at present, Exercise limited by: orthopedic condition(s)   Goals Addressed   None    Depression Screen    10/03/2022    1:41 PM 10/05/2018   10:49 AM 10/21/2017    9:04 AM 10/03/2017   10:29 AM 03/14/2016    2:57 PM  PHQ 2/9 Scores  PHQ - 2 Score  0 0 1 0  Exception Documentation Other- indicate reason in comment box      Not completed says her daughter knows these questions.        Fall Risk    10/03/2022    9:06 AM 09/12/2022    9:23 AM 05/30/2022    1:57 PM 08/07/2021    1:35 PM 06/06/2021    2:11 PM  [place] in the past year? 0 0 0 0 0  Number falls in past yr: 0 0 0 0 0  Injury with Fall? 0 0 0 0 0  Risk for fall due to : No Fall Risks No Fall Risks No Fall Risks No Fall Risks   Follow up Falls evaluation completed Falls evaluation completed Falls evaluation completed Falls evaluation completed Falls evaluation completed    [place]:  Any stairs in or around the home? Yes  If so, are there any without handrails? No  Home free of loose throw rugs in  walkways, pet beds, electrical cords, etc? Yes  Adequate lighting in your home to reduce risk of falls? Yes   ASSISTIVE DEVICES UTILIZED TO PREVENT FALLS:  Life alert? No  Use of a cane, walker or w/c? Yes  Grab bars in the bathroom? Yes  Shower chair or bench in shower? Yes  Elevated toilet seat or a handicapped toilet? Yes   TIMED UP AND GO:  Was the test performed? No .    Gait slow and steady with assistive  device  Cognitive Function:    10/03/2022    1:29 PM 10/05/2018   11:04 AM 10/03/2017   10:35 AM  MMSE - Mini Mental State Exam  Orientation to time '5 4 5  '$ Orientation to Place '5 5 5  '$ Registration '3 3 3  '$ Attention/ Calculation '5 5 5  '$ Recall '3 2 3  '$ Language- name 2 objects '2 2 2  '$ Language- repeat '1 1 1  '$ Language- follow 3 step command '3 3 3  '$ Language- read & follow direction '1 1 1  '$ Write a sentence '1 1 1  '$ Copy design '1 1 1  '$ Total score '30 28 30        '$ Immunizations Immunization History  Administered Date(s) Administered   DTaP 08/08/2015   Fluad Quad(high Dose 65+) 08/20/2021, 08/28/2022   Influenza Whole 08/06/2018   Influenza, High Dose Seasonal PF 08/13/2017, 08/21/2019, 08/16/2020   Influenza-Unspecified 08/02/2015, 08/15/2016   Moderna Covid-19 Vaccine Bivalent Booster 50yr & up 09/05/2022   Moderna Sars-Covid-2 Vaccination 11/08/2019, 12/06/2019, 09/12/2020   Pfizer Covid-19 Vaccine Bivalent Booster 126yr& up 10/18/2021, 02/16/2022   Pneumococcal Conjugate-13 08/02/2015   Pneumococcal Polysaccharide-23 09/05/1999   Tdap 08/08/2015    TDAP status: Up to date  Flu Vaccine status: Up to date  Pneumococcal vaccine status: Up to date  Covid-19 vaccine status: Completed vaccines  Qualifies for Shingles Vaccine? Yes   Zostavax completed No   Shingrix Completed?: No.    Education has been provided regarding the importance of this vaccine. Patient has been advised to call insurance company to determine out of pocket expense if they have not yet received this vaccine. Advised may also receive vaccine at local pharmacy or Health Dept. Verbalized acceptance and understanding.  Screening Tests Health Maintenance  Topic Date Due   Zoster Vaccines- Shingrix (1 of 2) Never done   COVID-19 Vaccine (7 - 2023-24 season) 10/31/2022   Medicare Annual Wellness (AWV)  10/04/2023   DTaP/Tdap/Td (3 - Td or Tdap) 08/07/2025   Pneumonia Vaccine 6541Years old  Completed    INFLUENZA VACCINE  Completed   DEXA SCAN  Completed   HPV VACCINES  Aged Out    Health Maintenance  Health Maintenance Due  Topic Date Due   Zoster Vaccines- Shingrix (1 of 2) Never done    Colorectal cancer screening: No longer required.      Lung Cancer Screening: (Low Dose CT Chest recommended if Age 86-80ears, 30 pack-year currently smoking OR have quit w/in 15years.) does not qualify.     Additional Screening:  Hepatitis C Screening: does not qualify; Completed   Vision Screening: Recommended annual ophthalmology exams for early detection of glaucoma and other disorders of the eye. Is the patient up to date with their annual eye exam?  Yes  Who is the provider or what is the name of the office in which the patient attends annual eye exams? Moya FaAdelina Mingsf pt is not established with a provider, would they like to  be referred to a provider to establish care? No .   Dental Screening: Recommended annual dental exams for proper oral hygiene  Community Resource Referral / Chronic Care Management: CRR required this visit?  No   CCM required this visit?  No      Plan:   The patient desires delay DEXA and Shingrix  I have personally reviewed and noted the following in the patient's chart:   Medical and social history Use of alcohol, tobacco or illicit drugs  Current medications and supplements including opioid prescriptions. Patient is not currently taking opioid prescriptions. Functional ability and status Nutritional status Physical activity Advanced directives List of other physicians Hospitalizations, surgeries, and ER visits in previous 12 months Vitals Screenings to include cognitive, depression, and falls Referrals and appointments  In addition, I have reviewed and discussed with patient certain preventive protocols, quality metrics, and best practice recommendations. A written personalized care plan for preventive services as well as general  preventive health recommendations were provided to patient.     Christion Leonhard X Kerra Guilfoil, NP   10/03/2022

## 2022-10-15 ENCOUNTER — Other Ambulatory Visit: Payer: Self-pay | Admitting: Orthopedic Surgery

## 2022-10-15 DIAGNOSIS — J3802 Paralysis of vocal cords and larynx, bilateral: Secondary | ICD-10-CM | POA: Diagnosis not present

## 2022-10-24 ENCOUNTER — Other Ambulatory Visit: Payer: Self-pay | Admitting: Nurse Practitioner

## 2022-10-24 DIAGNOSIS — E7849 Other hyperlipidemia: Secondary | ICD-10-CM

## 2022-11-20 DIAGNOSIS — H35352 Cystoid macular degeneration, left eye: Secondary | ICD-10-CM | POA: Diagnosis not present

## 2022-12-09 DIAGNOSIS — Z961 Presence of intraocular lens: Secondary | ICD-10-CM | POA: Diagnosis not present

## 2022-12-09 DIAGNOSIS — H02403 Unspecified ptosis of bilateral eyelids: Secondary | ICD-10-CM | POA: Diagnosis not present

## 2022-12-09 DIAGNOSIS — H401122 Primary open-angle glaucoma, left eye, moderate stage: Secondary | ICD-10-CM | POA: Diagnosis not present

## 2022-12-09 DIAGNOSIS — H401113 Primary open-angle glaucoma, right eye, severe stage: Secondary | ICD-10-CM | POA: Diagnosis not present

## 2022-12-12 ENCOUNTER — Encounter: Payer: Medicare Other | Admitting: Nurse Practitioner

## 2022-12-19 ENCOUNTER — Non-Acute Institutional Stay: Payer: Medicare Other | Admitting: Nurse Practitioner

## 2022-12-19 ENCOUNTER — Encounter: Payer: Self-pay | Admitting: Nurse Practitioner

## 2022-12-19 VITALS — BP 134/86 | HR 86 | Temp 97.3°F | Resp 18 | Ht 63.0 in | Wt 120.0 lb

## 2022-12-19 DIAGNOSIS — E7849 Other hyperlipidemia: Secondary | ICD-10-CM

## 2022-12-19 DIAGNOSIS — M839 Adult osteomalacia, unspecified: Secondary | ICD-10-CM | POA: Diagnosis not present

## 2022-12-19 DIAGNOSIS — I10 Essential (primary) hypertension: Secondary | ICD-10-CM

## 2022-12-19 DIAGNOSIS — H35352 Cystoid macular degeneration, left eye: Secondary | ICD-10-CM | POA: Diagnosis not present

## 2022-12-19 DIAGNOSIS — N1831 Chronic kidney disease, stage 3a: Secondary | ICD-10-CM

## 2022-12-19 DIAGNOSIS — M818 Other osteoporosis without current pathological fracture: Secondary | ICD-10-CM

## 2022-12-19 DIAGNOSIS — H5712 Ocular pain, left eye: Secondary | ICD-10-CM | POA: Diagnosis not present

## 2022-12-19 DIAGNOSIS — F418 Other specified anxiety disorders: Secondary | ICD-10-CM

## 2022-12-19 DIAGNOSIS — H04122 Dry eye syndrome of left lacrimal gland: Secondary | ICD-10-CM | POA: Diagnosis not present

## 2022-12-19 NOTE — Assessment & Plan Note (Signed)
Blood pressure is controlled, takes Losartan, ASA, Furosemide.  Bun/creat 20/0.99 eGFR 53 05/14/22, update CMP/eGFR 05/29/23

## 2022-12-19 NOTE — Assessment & Plan Note (Signed)
Her mood is stable, takes Lexapro since 06/06/21, already on Mirtazapine 60m qd, TSH 2.01 05/14/22, update TSH, Vit B12, Vit D 05/29/23

## 2022-12-19 NOTE — Assessment & Plan Note (Signed)
OP DEXA T score -4.3, declined Prolia, takes Vit D, Vit D level 40 05/17/20,  off Alendronate 2nd to jaw bone necrotic process

## 2022-12-19 NOTE — Assessment & Plan Note (Signed)
takes Omeprazole, Hgb 12.8 05/14/22, stable, repeat CBC/diff 05/29/23

## 2022-12-19 NOTE — Assessment & Plan Note (Signed)
takes Omega 3, Simvastatin. LDL 85 05/14/22, update lipid panel 05/29/23

## 2022-12-19 NOTE — Progress Notes (Signed)
Location:   [place] Room Number: [id number] Place of Service:  Clinic (12) Provider: Marlana Latus NP  Code Status: DNR Goals of Care:     10/03/2022    9:05 AM  Advanced Directives  Does Patient Have a Medical Advance Directive? Yes  Type of Paramedic of [place];Living will  Does patient want to make changes to medical advance directive? No - Patient declined  Copy of [place] in Chart? Yes - validated most recent copy scanned in chart (See row information)     Chief Complaint  Patient presents with   Medical Management of Chronic Issues    Patient is here for a follow up for chronic conditions     HPI: Patient is a 89 y.o. female seen today for medical management of chronic diseases.       HTN, takes Losartan, ASA, Furosemide.  Bun/creat 20/0.99 eGFR 53 05/14/22             CKD Bun/creat 20/0.99 eGFR 53 05/14/22             Depression, takes Lexapro since 06/06/21, already on Mirtazapine 37m qd, TSH 2.01 05/14/22             Hyperlipidemia, takes Omega 3, Simvastatin. LDL 85 05/14/22             OP DEXA T score -4.3, declined Prolia, takes Vit D, Vit D level 40 05/17/20,  off Alendronate 2nd to jaw bone necrotic process             Vocal cord paralysis, underwent ENT evaluation in the past.              GERD, takes Omeprazole, Hgb 12.8 05/14/22             Glaucoma R+L follow Ophthalmology Past Medical History:  Diagnosis Date   Anemia    Anxiety    Apnea 05/2000   sleep study revealed mild upper airway obstruction & apnea during speel, no set recommendations for therapy   Asthma    Diverticulosis    Dyspnea 12/15/09   spirometer normal & function actually worsens with nebk may have more of a "exercise-induced" type of bronchospasm    Fatigue 07/2015   GERD (gastroesophageal reflux disease)    Glaucoma    both eyes   Hyperlipidemia    Hypertension    Laryngeal paresis 09/12/2016   Loss of weight 06/20/2016   OSA  (obstructive sleep apnea)    Osteoporosis, senile    Vocal cord dysfunction    per ENT, pt NOT to have elective intubation without disscussing with him    Past Surgical History:  Procedure Laterality Date   CATARACT EXTRACTION W/ INTRAOCULAR LENS  IMPLANT, BILATERAL  2011   Dr. ECharise Killian  CERVICAL CONIZATION W/BX     due to cervical dysplasia   COLONOSCOPY  02/2002   Diverticublosis    EYE SURGERY Right 02/2016   laser surgery for scar tissure   nasolaryngoscopy  2001   reflux laryngitis   SKIN CANCER EXCISION     ?[place]  TONSILLECTOMY     UPPER GI ENDOSCOPY  05/2000   upper airway edema/changes that were consistent with GERD   v[facility] Left 2003   [facility]    Allergies  Allergen Reactions   Brimonidine Other (See Comments)   Dorzolamide Hcl Itching    EYE PAIN    Allergies as of  12/19/2022       Reactions   Brimonidine Other (See Comments)   Dorzolamide Hcl Itching   EYE PAIN        Medication List        Accurate as of December 19, 2022 11:59 PM. If you have any questions, ask your nurse or doctor.          ascorbic acid 500 MG tablet Commonly known as: VITAMIN C Take 500 mg by mouth daily.   aspirin EC 81 MG tablet Take 81 mg by mouth daily.   brinzolamide 1 % ophthalmic suspension Commonly known as: AZOPT Place 1 drop into both eyes 3 (three) times daily.   CALCIUM SOFT CHEWS PO Take by mouth.   escitalopram 10 MG tablet Commonly known as: Lexapro Take 1 tablet (10 mg total) by mouth daily.   escitalopram 5 MG tablet Commonly known as: LEXAPRO TAKE 1 TABLET (5 MG TOTAL) BY MOUTH DAILY.   Fish Oil 1000 MG Caps Take 1 capsule by mouth daily.   furosemide 20 MG tablet Commonly known as: LASIX TAKE 1/2 TABLET BY MOUTH EVERY DAY   ketorolac 0.4 % Soln Commonly known as: ACULAR Place 1 drop into the left eye 4 (four) times daily.   losartan 25 MG tablet Commonly known as: COZAAR TAKE TWO TABLETS (50MG) BY MOUTH  ONCE DAILY FOR BLOOD PRESSURE   loteprednol 0.2 % Susp Commonly known as: LOTEMAX Place 1 drop into the left eye in the morning, at noon, in the evening, and at bedtime.   mirtazapine 30 MG tablet Commonly known as: REMERON Take one a day   Netarsudil Dimesylate 0.02 % Soln Apply 1 drop to eye at bedtime. Both eyes   simvastatin 10 MG tablet Commonly known as: ZOCOR TAKE 1 TABLET (10 MG TOTAL) BY MOUTH DAILY AT 12 NOON. OVERDUE FOR LABS   Timolol Maleate (Once-Daily) 0.5 % Soln Place 1 drop into both eyes 2 (two) times daily.        Review of Systems:  Review of Systems  Constitutional:  Negative for fatigue and fever.  HENT:  Positive for hearing loss and voice change. Negative for congestion.        Vocal cord paralysis.   Respiratory:  Negative for cough and wheezing.   Cardiovascular:  Positive for leg swelling.  Gastrointestinal:  Negative for abdominal pain and constipation.  Genitourinary:  Negative for dysuria and urgency.  Musculoskeletal:  Positive for arthralgias and gait problem.  Skin:  Negative for color change.  Neurological:  Negative for speech difficulty, weakness and light-headedness.       Memory lapses.   Psychiatric/Behavioral:  Positive for sleep disturbance. Negative for confusion. The patient is nervous/anxious.     Health Maintenance  Topic Date Due   COVID-19 Vaccine (7 - 2023-24 season) 02/03/2023 (Originally 10/31/2022)   Zoster Vaccines- Shingrix (1 of 2) 11/04/2094 (Originally 08/03/1979)   Medicare Annual Wellness (AWV)  10/04/2023   DTaP/Tdap/Td (3 - Td or Tdap) 08/07/2025   Pneumonia Vaccine 16+ Years old  Completed   INFLUENZA VACCINE  Completed   DEXA SCAN  Completed   HPV VACCINES  Aged Out    Physical Exam: Vitals:   12/19/22 1407  BP: 134/86  Pulse: 86  Resp: 18  Temp: (!) 97.3 F (36.3 C)  SpO2: 95%  Weight: 120 lb (54.4 kg)  Height: 5' 3"$  (1.6 m)   Body mass index is 21.26 kg/m. Physical Exam Vitals reviewed.   Constitutional:  Appearance: Normal appearance.  HENT:     Head: Normocephalic and atraumatic.     Nose: Nose normal.     Mouth/Throat:     Mouth: Mucous membranes are moist.     Comments: Torus plantinus Eyes:     Extraocular Movements: Extraocular movements intact.     Conjunctiva/sclera: Conjunctivae normal.     Pupils: Pupils are equal, round, and reactive to light.     Comments: Left lower eyelid entropion, Hx of left corneal ulceration, R+L glaucoma, able to read  Cardiovascular:     Rate and Rhythm: Normal rate and regular rhythm.     Heart sounds: No murmur heard. Pulmonary:     Effort: Pulmonary effort is normal.     Breath sounds: Wheezing present. No rales.     Comments: Anterior neck, chronic sternal region wheezes, disappearing when breathing with mouth closed.   Abdominal:     Palpations: Abdomen is soft.     Tenderness: There is no abdominal tenderness.  Musculoskeletal:     Cervical back: Normal range of motion and neck supple.     Right lower leg: Edema present.     Left lower leg: Edema present.     Comments: Trace edema BLE  Skin:    General: Skin is warm and dry.  Neurological:     General: No focal deficit present.     Mental Status: She is alert. Mental status is at baseline.     Motor: No weakness.     Gait: Gait normal.     Comments: Oriented to person and place. Able to walk  Psychiatric:        Mood and Affect: Mood normal.        Behavior: Behavior normal.        Thought Content: Thought content normal.        Judgment: Judgment normal.     Labs reviewed: Basic Metabolic Panel: Recent Labs    05/14/22 0700  NA 144  K 4.1  CL 103  CO2 35*  GLUCOSE 79  BUN 20  CREATININE 0.99*  CALCIUM 9.2  TSH 2.01   Liver Function Tests: Recent Labs    05/14/22 0700  AST 21  ALT 10  BILITOT 0.4  PROT 6.5   No results for input(s): "LIPASE", "AMYLASE" in the last 8760 hours. No results for input(s): "AMMONIA" in the last 8760  hours. CBC: Recent Labs    05/14/22 0700  WBC 4.8  NEUTROABS 2,789  HGB 12.8  HCT 40.5  MCV 95.3  PLT 211   Lipid Panel: Recent Labs    05/14/22 0700  CHOL 175  HDL 66  LDLCALC 85  TRIG 140  CHOLHDL 2.7   Lab Results  Component Value Date   HGBA1C 5.6 05/17/2020    Procedures since last visit: No results found.  Assessment/Plan  HTN (hypertension)  Blood pressure is controlled, takes Losartan, ASA, Furosemide.  Bun/creat 20/0.99 eGFR 53 05/14/22, update CMP/eGFR 05/29/23  CKD (chronic kidney disease) stage 3, GFR 30-59 ml/min (HCC) Bun/creat 20/0.99 eGFR 53 05/14/22  Depression with anxiety Her mood is stable, takes Lexapro since 06/06/21, already on Mirtazapine 64m qd, TSH 2.01 05/14/22, update TSH, Vit B12, Vit D 05/29/23  Hyperlipidemia  takes Omega 3, Simvastatin. LDL 85 05/14/22, update lipid panel 05/29/23  Age-related osteoporosis without fracture   OP DEXA T score -4.3, declined Prolia, takes Vit D, Vit D level 40 05/17/20,  off Alendronate 2nd to jaw bone necrotic process  GERD (  gastroesophageal reflux disease)  takes Omeprazole, Hgb 12.8 05/14/22, stable, repeat CBC/diff 05/29/23   Labs/tests ordered: CBC/diff, CMP/eGFR, lipid panel, TSH, Vit D, Vit B12  Next appt:  6 months.

## 2022-12-19 NOTE — Assessment & Plan Note (Signed)
Bun/creat 20/0.99 eGFR 53 05/14/22 

## 2022-12-20 ENCOUNTER — Encounter: Payer: Self-pay | Admitting: Nurse Practitioner

## 2022-12-24 ENCOUNTER — Other Ambulatory Visit: Payer: Medicare Other

## 2023-02-19 DIAGNOSIS — H35352 Cystoid macular degeneration, left eye: Secondary | ICD-10-CM | POA: Diagnosis not present

## 2023-02-19 DIAGNOSIS — H35372 Puckering of macula, left eye: Secondary | ICD-10-CM | POA: Diagnosis not present

## 2023-03-05 DIAGNOSIS — H35352 Cystoid macular degeneration, left eye: Secondary | ICD-10-CM | POA: Diagnosis not present

## 2023-03-26 ENCOUNTER — Encounter: Payer: Self-pay | Admitting: Family Medicine

## 2023-03-26 ENCOUNTER — Non-Acute Institutional Stay: Payer: Medicare Other | Admitting: Family Medicine

## 2023-03-26 VITALS — BP 148/92 | HR 73 | Ht 63.0 in | Wt 116.4 lb

## 2023-03-26 DIAGNOSIS — R609 Edema, unspecified: Secondary | ICD-10-CM

## 2023-03-26 DIAGNOSIS — N1831 Chronic kidney disease, stage 3a: Secondary | ICD-10-CM

## 2023-03-26 DIAGNOSIS — R634 Abnormal weight loss: Secondary | ICD-10-CM | POA: Diagnosis not present

## 2023-03-26 MED ORDER — MIRTAZAPINE 7.5 MG PO TABS: 7.5000 mg | ORAL_TABLET | Freq: Every day | ORAL | 1 refills | Status: DC

## 2023-03-26 NOTE — Progress Notes (Signed)
Provider:  Jacalyn Lefevre, MD  Careteam: Patient Care Team: Mast, Man X, NP as PCP - General (Internal Medicine) Loraine Grip, Harrietta Guardian, MD as Referring Physician (Ophthalmology) Donzetta Starch, MD as Consulting Physician (Dermatology) Barnie Alderman, MD as Consulting Physician (Otolaryngology) Mast, Man X, NP as Nurse Practitioner (Internal Medicine)  PLACE OF SERVICE:  [facility]  Advanced Directive information    Allergies  Allergen Reactions   Brimonidine Other (See Comments)   Dorzolamide Hcl Itching    EYE PAIN    No chief complaint on file.    HPI: Patient is a 89 y.o. female presents today with swelling in her ankles.  Was first noticed on 03/22/2023 by daughter.  She does take Lasix 10 mg a day.  She has a history of mild chronic kidney disease stage IIIa.  There is no history of heart failure.  Review of Systems:  Review of Systems  Constitutional:  Positive for weight loss.  Respiratory: Negative.    Cardiovascular:  Positive for leg swelling.  Genitourinary: Negative.   Musculoskeletal: Negative.   Neurological: Negative.   Psychiatric/Behavioral: Negative.    All other systems reviewed and are negative.   Past Medical History:  Diagnosis Date   Anemia    Anxiety    Apnea 05/2000   sleep study revealed mild upper airway obstruction & apnea during speel, no set recommendations for therapy   Asthma    Diverticulosis    Dyspnea 12/15/09   spirometer normal & function actually worsens with nebk may have more of a "exercise-induced" type of bronchospasm    Fatigue 07/2015   GERD (gastroesophageal reflux disease)    Glaucoma    both eyes   Hyperlipidemia    Hypertension    Laryngeal paresis 09/12/2016   Loss of weight 06/20/2016   OSA (obstructive sleep apnea)    Osteoporosis, senile    Vocal cord dysfunction    per ENT, pt NOT to have elective intubation without disscussing with him   Past Surgical History:  Procedure Laterality Date    CATARACT EXTRACTION W/ INTRAOCULAR LENS  IMPLANT, BILATERAL  2011   Dr. Emmit Pomfret   CERVICAL CONIZATION W/BX     due to cervical dysplasia   COLONOSCOPY  02/2002   Diverticublosis    EYE SURGERY Right 02/2016   laser surgery for scar tissure   nasolaryngoscopy  2001   reflux laryngitis   SKIN CANCER EXCISION     ?BCC   TONSILLECTOMY     UPPER GI ENDOSCOPY  05/2000   upper airway edema/changes that were consistent with GERD   vocal fold cordotomy Left 2003   Dr. Viann Shove [facility]   Social History:   reports that she has never smoked. Her smokeless tobacco use includes chew and snuff. She reports that she does not drink alcohol and does not use drugs.  Family History  Problem Relation Age of Onset   Melanoma Mother    Transient ischemic attack Father    Multiple sclerosis Sister    Cancer Brother    Colon cancer Neg Hx     Medications: Patient's Medications  New Prescriptions   No medications on file  Previous Medications   ASPIRIN EC 81 MG TABLET    Take 81 mg by mouth daily.   BRINZOLAMIDE (AZOPT) 1 % OPHTHALMIC SUSPENSION    Place 1 drop into both eyes 3 (three) times daily.    CALCIUM-VITAMIN D-VITAMIN K (CALCIUM SOFT CHEWS PO)    Take by mouth.  ESCITALOPRAM (LEXAPRO) 10 MG TABLET    Take 1 tablet (10 mg total) by mouth daily.   ESCITALOPRAM (LEXAPRO) 5 MG TABLET    TAKE 1 TABLET (5 MG TOTAL) BY MOUTH DAILY.   FUROSEMIDE (LASIX) 20 MG TABLET    TAKE 1/2 TABLET BY MOUTH EVERY DAY   KETOROLAC (ACULAR) 0.4 % SOLN    Place 1 drop into the left eye 4 (four) times daily.   LOSARTAN (COZAAR) 25 MG TABLET    TAKE TWO TABLETS (50MG ) BY MOUTH ONCE DAILY FOR BLOOD PRESSURE   LOTEPREDNOL (LOTEMAX) 0.2 % SUSP    Place 1 drop into the left eye in the morning, at noon, in the evening, and at bedtime.   MIRTAZAPINE (REMERON) 30 MG TABLET    Take one a day   NETARSUDIL DIMESYLATE 0.02 % SOLN    Apply 1 drop to eye at bedtime. Both eyes   OMEGA-3 FATTY ACIDS (FISH OIL) 1000 MG CAPS     Take 1 capsule by mouth daily.    SIMVASTATIN (ZOCOR) 10 MG TABLET    TAKE 1 TABLET (10 MG TOTAL) BY MOUTH DAILY AT 12 NOON. OVERDUE FOR LABS   TIMOLOL MALEATE 0.5 % (DAILY) SOLN    Place 1 drop into both eyes 2 (two) times daily.    VITAMIN C (ASCORBIC ACID) 500 MG TABLET    Take 500 mg by mouth daily.  Modified Medications   No medications on file  Discontinued Medications   No medications on file    Physical Exam:  There were no vitals filed for this visit. There is no height or weight on file to calculate BMI. Wt Readings from Last 3 Encounters:  12/19/22 120 lb (54.4 kg)  10/03/22 120 lb (54.4 kg)  09/12/22 120 lb (54.4 kg)    Physical Exam Vitals and nursing note reviewed.  Constitutional:      Appearance: Normal appearance.  Cardiovascular:     Rate and Rhythm: Normal rate and regular rhythm.  Pulmonary:     Effort: Pulmonary effort is normal.     Breath sounds: Normal breath sounds.  Musculoskeletal:     Right lower leg: Edema present.     Left lower leg: Edema present.     Comments: Edema is mild, may be bilaterally 1+  Neurological:     General: No focal deficit present.     Mental Status: She is alert and oriented to person, place, and time.     Labs reviewed: Basic Metabolic Panel: Recent Labs    05/14/22 0700  NA 144  K 4.1  CL 103  CO2 35*  GLUCOSE 79  BUN 20  CREATININE 0.99*  CALCIUM 9.2  TSH 2.01   Liver Function Tests: Recent Labs    05/14/22 0700  AST 21  ALT 10  BILITOT 0.4  PROT 6.5   No results for input(s): "LIPASE", "AMYLASE" in the last 8760 hours. No results for input(s): "AMMONIA" in the last 8760 hours. CBC: Recent Labs    05/14/22 0700  WBC 4.8  NEUTROABS 2,789  HGB 12.8  HCT 40.5  MCV 95.3  PLT 211   Lipid Panel: Recent Labs    05/14/22 0700  CHOL 175  HDL 66  LDLCALC 85  TRIG 140  CHOLHDL 2.7   TSH: Recent Labs    05/14/22 0700  TSH 2.01   A1C: Lab Results  Component Value Date   HGBA1C 5.6  05/17/2020     Assessment/Plan  1. Weight  loss Patient does not eat in the dining room secondary to denture problems.  Seems to be an issue with anorexia as well.  Have suggested supplements such as Ensure or boost but also will change mirtazapine from 30 mg to 7.5 mg  2. Stage 3a chronic kidney disease (HCC) This is mild.  Encouraged increased fluid intake which should also help her edema  3. Edema, unspecified type Try the typical 9 medication approach such as elevating feet, walking, decreasing salt, support hose, vascular to monitor blood pressure with nurse and return to clinic in 2 weeks with readings of her blood pressure   Jacalyn Lefevre, MD [facility] [phone]

## 2023-03-26 NOTE — Patient Instructions (Signed)
Walk Decrease salt intake Elevate feet when resting Monitor BP for 2 weeks and bring readings at next appointment

## 2023-04-02 ENCOUNTER — Telehealth: Payer: Self-pay

## 2023-04-02 NOTE — Telephone Encounter (Signed)
Sorry for confusion but 7.5 mg is the dose I think she needs

## 2023-04-02 NOTE — Telephone Encounter (Addendum)
Call returned to patients daughter, Dr.Miller's response was relayed, and Dois Davenport questioned if Dr.Miller could call her directly @ [phone] . Dois Davenport asked that it be recorded in her mothers record " I don't like the fact that we were told one thing and when we went to the pharmacy we received a different dose than what was discussed." Dois Davenport states she needs Dr.Miller to explain to her if his decision to reduce medication was based on the patients weight or a psychological evaluation.

## 2023-04-02 NOTE — Telephone Encounter (Signed)
Patient daughter Dois Davenport called and states that mirtazapine went from 30mg  to 7.5mg . She states that 15mg  is what was told to her by Dr.Miller. PLEASE CALL PATIENT DAUGHTER!!!

## 2023-04-02 NOTE — Telephone Encounter (Signed)
Spoke to daughter on phone about concerns with her mom.tried to address these. Plans to see Dr Leanor Rubenstein for another opinion re her mom's behavior changes. I t seems like when routines are changed, pt does not do as well, suggesting possible underlying cognitive issue

## 2023-04-09 ENCOUNTER — Encounter: Payer: Medicare Other | Admitting: Family Medicine

## 2023-04-17 ENCOUNTER — Encounter: Payer: Self-pay | Admitting: Nurse Practitioner

## 2023-04-17 ENCOUNTER — Non-Acute Institutional Stay: Payer: Medicare Other | Admitting: Nurse Practitioner

## 2023-04-17 VITALS — BP 108/69 | HR 69 | Temp 97.5°F | Resp 20 | Ht 63.0 in | Wt 113.0 lb

## 2023-04-17 DIAGNOSIS — R634 Abnormal weight loss: Secondary | ICD-10-CM

## 2023-04-17 DIAGNOSIS — F418 Other specified anxiety disorders: Secondary | ICD-10-CM | POA: Diagnosis not present

## 2023-04-17 DIAGNOSIS — E7849 Other hyperlipidemia: Secondary | ICD-10-CM

## 2023-04-17 DIAGNOSIS — M818 Other osteoporosis without current pathological fracture: Secondary | ICD-10-CM

## 2023-04-17 DIAGNOSIS — I10 Essential (primary) hypertension: Secondary | ICD-10-CM

## 2023-04-17 DIAGNOSIS — K219 Gastro-esophageal reflux disease without esophagitis: Secondary | ICD-10-CM | POA: Diagnosis not present

## 2023-04-17 DIAGNOSIS — J3802 Paralysis of vocal cords and larynx, bilateral: Secondary | ICD-10-CM

## 2023-04-17 DIAGNOSIS — N1831 Chronic kidney disease, stage 3a: Secondary | ICD-10-CM | POA: Diagnosis not present

## 2023-04-17 NOTE — Progress Notes (Signed)
Location:   Clinic [facility] Room Number: FOX/1103/P Place of Service:  Clinic (12) Provider: Chipper Oman NP  Code Status: DNR Goals of Care:     04/17/2023    8:37 AM  Advanced Directives  Does Angela [name] Have a Medical Advance Directive? Yes  Type of Advance Directive Living will  Does Angela [name] want to make changes to medical advance directive? No - Angela [name] declined     Chief Complaint  Angela [name] presents with   Acute Visit    Angela [name] is here for a 2 WK F/U for weightloss    HPI: Angela [name] is a 89 y.o. female seen today for medical management of chronic diseases.     Weight loss, saw Dr. Hyacinth Meeker 03/26/23 changed Mirtazapine to 7.5mg /30mg  HTN, takes Losartan, ASA, Furosemide.  Bun/creat 20/0.99 eGFR 53 05/14/22             CKD Bun/creat 20/0.99 eGFR 53 05/14/22             Depression, takes Lexapro and Mirtazapine, TSH 2.01 05/14/22             Hyperlipidemia, takes Omega 3, Simvastatin. LDL 85 05/14/22             OP DEXA T score -4.3, declined Prolia, takes Vit D, Vit D level 40 05/17/20,  off Alendronate 2nd to jaw bone necrotic process             Vocal cord paralysis, underwent ENT evaluation in the past.              GERD, takes Omeprazole, Hgb 12.8 05/14/22             Glaucoma R+L follow Ophthalmology  Past Medical History:  Diagnosis Date   Anemia    Anxiety    Apnea 05/2000   sleep study revealed mild upper airway obstruction & apnea during speel, no set recommendations for therapy   Asthma    Diverticulosis    Dyspnea 12/15/09   spirometer normal & function actually worsens with nebk may have more of a "exercise-induced" type of bronchospasm    Fatigue 07/2015   GERD (gastroesophageal reflux disease)    Glaucoma    both eyes   Hyperlipidemia    Hypertension    Laryngeal paresis 09/12/2016   Loss of weight 06/20/2016   OSA (obstructive sleep apnea)    Osteoporosis, senile    Vocal cord dysfunction    per ENT, pt NOT to have elective intubation without  disscussing with him    Past Surgical History:  Procedure Laterality Date   CATARACT EXTRACTION W/ INTRAOCULAR LENS  IMPLANT, BILATERAL  2011   Dr. Emmit Pomfret   CERVICAL CONIZATION W/BX     due to cervical dysplasia   COLONOSCOPY  02/2002   Diverticublosis    EYE SURGERY Right 02/2016   laser surgery for scar tissure   nasolaryngoscopy  2001   reflux laryngitis   SKIN CANCER EXCISION     ?BCC   TONSILLECTOMY     UPPER GI ENDOSCOPY  05/2000   upper airway edema/changes that were consistent with GERD   vocal fold cordotomy Left 2003   Dr. Viann Shove [facility]    Allergies  Allergen Reactions   Brimonidine Other (See Comments)   Dorzolamide Hcl Itching    EYE PAIN    Allergies as of 04/17/2023       Reactions   Brimonidine Other (See Comments)   Dorzolamide Hcl Itching   EYE PAIN  Medication List        Accurate as of April 17, 2023  3:03 PM. If you have any questions, ask your nurse or doctor.          STOP taking these medications    ascorbic acid 500 MG tablet Commonly known as: VITAMIN C Stopped by: Mlissa Tamayo X Tanishka Drolet, NP   CALCIUM SOFT CHEWS PO Stopped by: Shaley Leavens X Shamirah Ivan, NP   Fish Oil 1000 MG Caps Stopped by: Falecia Vannatter X Baylyn Sickles, NP       TAKE these medications    aspirin EC 81 MG tablet Take 81 mg by mouth daily.   brinzolamide 1 % ophthalmic suspension Commonly known as: AZOPT Place 1 drop into both eyes 3 (three) times daily.   escitalopram 10 MG tablet Commonly known as: Lexapro Take 1 tablet (10 mg total) by mouth daily.   furosemide 20 MG tablet Commonly known as: LASIX TAKE 1/2 TABLET BY MOUTH EVERY DAY   ketorolac 0.4 % Soln Commonly known as: ACULAR Place 1 drop into the left eye 4 (four) times daily.   losartan 25 MG tablet Commonly known as: COZAAR TAKE TWO TABLETS (50MG ) BY MOUTH ONCE DAILY FOR BLOOD PRESSURE   loteprednol 0.2 % Susp Commonly known as: LOTEMAX Place 1 drop into the left eye in the morning, at noon, in the evening, and  at bedtime.   mirtazapine 7.5 MG tablet Commonly known as: REMERON Take 1 tablet (7.5 mg total) by mouth at bedtime. For appetite   Netarsudil Dimesylate 0.02 % Soln Apply 1 drop to eye at bedtime. Both eyes   simvastatin 10 MG tablet Commonly known as: ZOCOR TAKE 1 TABLET (10 MG TOTAL) BY MOUTH DAILY AT 12 NOON. OVERDUE FOR LABS   Timolol Maleate (Once-Daily) 0.5 % Soln Place 1 drop into both eyes 2 (two) times daily.        Review of Systems:  Review of Systems  Constitutional:  Positive for unexpected weight change. Negative for fatigue and fever.  HENT:  Positive for hearing loss and voice change. Negative for congestion and trouble swallowing.        Vocal cord paralysis.   Respiratory:  Negative for cough and wheezing.   Cardiovascular:  Positive for leg swelling.  Gastrointestinal:  Negative for abdominal pain and constipation.  Genitourinary:  Negative for dysuria and urgency.  Musculoskeletal:  Positive for arthralgias and gait problem.  Skin:  Negative for color change.  Neurological:  Negative for speech difficulty, weakness and light-headedness.       Memory lapses.   Psychiatric/Behavioral:  Positive for sleep disturbance. Negative for confusion. The Angela [name] is nervous/anxious.     Health Maintenance  Topic Date Due   COVID-19 Vaccine (7 - 2023-24 season) 06/05/2023 (Originally 10/31/2022)   Zoster Vaccines- Shingrix (1 of 2) 11/04/2094 (Originally [birth year]/1979)   INFLUENZA VACCINE  06/05/2023   Medicare Annual Wellness (AWV)  10/04/2023   DTaP/Tdap/Td (3 - Td or Tdap) 08/07/2025   Pneumonia Vaccine 70+ Years old  Completed   DEXA SCAN  Completed   HPV VACCINES  Aged Out    Physical Exam: Vitals:   04/17/23 1359  BP: 108/69  Pulse: 69  Resp: 20  Temp: (!) 97.5 F (36.4 C)  SpO2: 93%  Weight: 113 lb (51.3 kg)  Height: 5\' 3"  (1.6 m)   Body mass index is 20.02 kg/m. Physical Exam Vitals reviewed.  Constitutional:      Appearance: Normal  appearance.  HENT:  Head: Normocephalic and atraumatic.     Nose: Nose normal.     Mouth/Throat:     Mouth: Mucous membranes are moist.     Comments: Torus plantinus Eyes:     Extraocular Movements: Extraocular movements intact.     Conjunctiva/sclera: Conjunctivae normal.     Pupils: Pupils are equal, round, and reactive to light.     Comments: Left lower eyelid entropion, Hx of left corneal ulceration, R+L glaucoma, able to read  Cardiovascular:     Rate and Rhythm: Normal rate and regular rhythm.     Heart sounds: No murmur heard. Pulmonary:     Effort: Pulmonary effort is normal.     Breath sounds: No wheezing or rales.     Comments: Anterior neck, chronic sternal region wheezes, disappearing when breathing with mouth closed.   Abdominal:     Palpations: Abdomen is soft.     Tenderness: There is no abdominal tenderness.  Musculoskeletal:     Cervical back: Normal range of motion and neck supple.     Right lower leg: Edema present.     Left lower leg: Edema present.     Comments: Trace edema BLE  Skin:    General: Skin is warm and dry.  Neurological:     General: No focal deficit present.     Mental Status: She is alert. Mental status is at baseline.     Motor: No weakness.     Gait: Gait normal.     Comments: Oriented to person and place. Able to walk  Psychiatric:        Mood and Affect: Mood normal.        Behavior: Behavior normal.        Thought Content: Thought content normal.        Judgment: Judgment normal.     Labs reviewed: Basic Metabolic Panel: Recent Labs    05/14/22 0700  NA 144  K 4.1  CL 103  CO2 35*  GLUCOSE 79  BUN 20  CREATININE 0.99*  CALCIUM 9.2  TSH 2.01   Liver Function Tests: Recent Labs    05/14/22 0700  AST 21  ALT 10  BILITOT 0.4  PROT 6.5   No results for input(s): "LIPASE", "AMYLASE" in the last 8760 hours. No results for input(s): "AMMONIA" in the last 8760 hours. CBC: Recent Labs    05/14/22 0700  WBC 4.8   NEUTROABS 2,789  HGB 12.8  HCT 40.5  MCV 95.3  PLT 211   Lipid Panel: Recent Labs    05/14/22 0700  CHOL 175  HDL 66  LDLCALC 85  TRIG 140  CHOLHDL 2.7   Lab Results  Component Value Date   HGBA1C 5.6 05/17/2020    Procedures since last visit: No results found.  Assessment/Plan  HTN (hypertension) Low Bps, takes Losartan, ASA, dc Furosemide.  Bun/creat 20/0.99 eGFR 53 05/14/22  CKD (chronic kidney disease) stage 3, GFR 30-59 ml/min (HCC) Bun/creat 20/0.99 eGFR 53 05/14/22  Depression with anxiety  takes Lexapro and Mirtazapine, TSH 2.01 05/14/22  Hyperlipidemia takes Omega 3, Simvastatin. LDL 85 05/14/22  Age-related osteoporosis without fracture DEXA T score -4.3, declined Prolia, takes Vit D, Vit D level 40 05/17/20,  off Alendronate 2nd to jaw bone necrotic process  Vocal fold paralysis, bilateral underwent ENT evaluation in the past.   GERD (gastroesophageal reflux disease) takes Omeprazole, Hgb 12.8 05/14/22, ST to evaluate and treat.   Weight loss saw Dr. Hyacinth Meeker 03/26/23 changed Mirtazapine to 7.5mg /30mg ,  dc Furosemide-minimal edema in ankles, update CBC/diff, CMP/eGFR, TSH, lipids, Vit B12, Vit D next week   Labs/tests ordered:  CBC/diff, CMP/eGFR, TSH, Lipids, Vit B12, Vit D  Next appt:  05/27/2023

## 2023-04-17 NOTE — Assessment & Plan Note (Signed)
takes Omega 3, Simvastatin. LDL 85 05/14/22 

## 2023-04-17 NOTE — Assessment & Plan Note (Addendum)
takes Omeprazole, Hgb 12.8 05/14/22, ST to evaluate and treat.

## 2023-04-17 NOTE — Assessment & Plan Note (Signed)
Bun/creat 20/0.99 eGFR 53 05/14/22 

## 2023-04-17 NOTE — Assessment & Plan Note (Addendum)
Low Bps, takes Losartan, ASA, dc Furosemide.  Bun/creat 20/0.99 eGFR 53 05/14/22

## 2023-04-17 NOTE — Assessment & Plan Note (Signed)
underwent ENT evaluation in the past.  

## 2023-04-17 NOTE — Assessment & Plan Note (Signed)
DEXA T score -4.3, declined Prolia, takes Vit D, Vit D level 40 05/17/20,  off Alendronate 2nd to jaw bone necrotic process 

## 2023-04-17 NOTE — Assessment & Plan Note (Addendum)
saw Dr. Hyacinth Meeker 03/26/23 changed Mirtazapine to 7.5mg /30mg , dc Furosemide-minimal edema in ankles, update CBC/diff, CMP/eGFR, TSH, lipids, Vit B12, Vit D next week

## 2023-04-17 NOTE — Assessment & Plan Note (Signed)
takes Lexapro and Mirtazapine, TSH 2.01 05/14/22

## 2023-04-23 ENCOUNTER — Encounter: Payer: Medicare Other | Admitting: Family Medicine

## 2023-04-23 DIAGNOSIS — H35352 Cystoid macular degeneration, left eye: Secondary | ICD-10-CM | POA: Diagnosis not present

## 2023-04-24 ENCOUNTER — Encounter: Payer: Self-pay | Admitting: Nurse Practitioner

## 2023-04-24 DIAGNOSIS — E785 Hyperlipidemia, unspecified: Secondary | ICD-10-CM | POA: Diagnosis not present

## 2023-04-24 DIAGNOSIS — I10 Essential (primary) hypertension: Secondary | ICD-10-CM | POA: Diagnosis not present

## 2023-04-24 DIAGNOSIS — Z1321 Encounter for screening for nutritional disorder: Secondary | ICD-10-CM | POA: Diagnosis not present

## 2023-04-24 DIAGNOSIS — E039 Hypothyroidism, unspecified: Secondary | ICD-10-CM | POA: Diagnosis not present

## 2023-04-24 DIAGNOSIS — M810 Age-related osteoporosis without current pathological fracture: Secondary | ICD-10-CM | POA: Diagnosis not present

## 2023-04-24 LAB — HEPATIC FUNCTION PANEL
ALT: 8 U/L (ref 7–35)
AST: 18 (ref 13–35)
Alkaline Phosphatase: 94 (ref 25–125)
Bilirubin, Total: 0.6

## 2023-04-24 LAB — LIPID PANEL
Cholesterol: 163 (ref 0–200)
HDL: 61 (ref 35–70)
LDL Cholesterol: 83
LDl/HDL Ratio: 2.7
Triglycerides: 101 (ref 40–160)

## 2023-04-24 LAB — BASIC METABOLIC PANEL
BUN: 20 (ref 4–21)
CO2: 32 — AB (ref 13–22)
Chloride: 105 (ref 99–108)
Creatinine: 0.8 (ref 0.5–1.1)
Glucose: 97
Potassium: 4.2 mEq/L (ref 3.5–5.1)
Sodium: 142 (ref 137–147)

## 2023-04-24 LAB — COMPREHENSIVE METABOLIC PANEL
Albumin: 4 (ref 3.5–5.0)
Calcium: 9.2 (ref 8.7–10.7)
Globulin: 2.2
eGFR: 70

## 2023-04-24 LAB — CBC AND DIFFERENTIAL
HCT: 41 (ref 36–46)
Hemoglobin: 13.1 (ref 12.0–16.0)
Neutrophils Absolute: 2724
Platelets: 215 10*3/uL (ref 150–400)
WBC: 4.4

## 2023-04-24 LAB — VITAMIN B12: Vitamin B-12: 307

## 2023-04-24 LAB — CBC: RBC: 4.3 (ref 3.87–5.11)

## 2023-04-28 ENCOUNTER — Encounter: Payer: Self-pay | Admitting: Nurse Practitioner

## 2023-04-28 ENCOUNTER — Non-Acute Institutional Stay: Payer: Medicare Other | Admitting: Nurse Practitioner

## 2023-04-28 ENCOUNTER — Telehealth: Payer: Self-pay

## 2023-04-28 DIAGNOSIS — R609 Edema, unspecified: Secondary | ICD-10-CM | POA: Diagnosis not present

## 2023-04-28 DIAGNOSIS — R634 Abnormal weight loss: Secondary | ICD-10-CM | POA: Diagnosis not present

## 2023-04-28 DIAGNOSIS — I10 Essential (primary) hypertension: Secondary | ICD-10-CM

## 2023-04-28 DIAGNOSIS — J9811 Atelectasis: Secondary | ICD-10-CM | POA: Diagnosis not present

## 2023-04-28 DIAGNOSIS — K219 Gastro-esophageal reflux disease without esophagitis: Secondary | ICD-10-CM

## 2023-04-28 DIAGNOSIS — N1831 Chronic kidney disease, stage 3a: Secondary | ICD-10-CM | POA: Diagnosis not present

## 2023-04-28 DIAGNOSIS — F418 Other specified anxiety disorders: Secondary | ICD-10-CM

## 2023-04-28 DIAGNOSIS — E7849 Other hyperlipidemia: Secondary | ICD-10-CM

## 2023-04-28 NOTE — Assessment & Plan Note (Signed)
takes Omeprazole, Hgb 13.1 04/24/23

## 2023-04-28 NOTE — Assessment & Plan Note (Signed)
More noticeable, expiratory wheezes too, denied dizziness, change of vision, cough, chest pain, phlegm production, chest pain, palpitation, O2 desaturation, resume Furosemide 20mg  every day, obtain CXR ap/lateral.

## 2023-04-28 NOTE — Assessment & Plan Note (Signed)
saw Dr. Hyacinth Meeker 03/26/23 changed Mirtazapine to 7.5mg /30mg , TSH 1.5 04/24/23, supportive care.

## 2023-04-28 NOTE — Assessment & Plan Note (Signed)
Bun/creat 20/0.79 04/24/23

## 2023-04-28 NOTE — Assessment & Plan Note (Signed)
takes Lexapro and Mirtazapine, TSH 1.5 04/24/23, MMSE 30/30 04/22/23, more anxious, ? Moving into [place] [facility] contributory

## 2023-04-28 NOTE — Telephone Encounter (Signed)
Mast, Man X, NP  You2 minutes ago (4:55 PM)    I will change the order for her. Thanks.  Patient daughter was advised via voicemail.

## 2023-04-28 NOTE — Assessment & Plan Note (Addendum)
Takes Simvastatin. LDL 83 04/24/23

## 2023-04-28 NOTE — Progress Notes (Unsigned)
Location:   [facility] Room Number: AL/915/A Place of Service:  ALF (13) Provider: Arna Snipe Marylene Masek NP  Lamount Bankson X, NP  Patient Care Team: Schyler Counsell X, NP as PCP - General (Internal Medicine) Moya, Harrietta Guardian, MD as Referring Physician (Ophthalmology) Donzetta Starch, MD as Consulting Physician (Dermatology) Barnie Alderman, MD as Consulting Physician (Otolaryngology) Hanna Aultman X, NP as Nurse Practitioner (Internal Medicine)  Extended Emergency Contact Information Primary Emergency Contact: Lagrea,Sandra Address: [address]          [place], [place] [address] D[place] of [place] Home Phone: [phone] Mobile Phone: [phone] Relation: Daughter Secondary Emergency Contact: Alejo,Daniel Mobile Phone: [phone] Relation: Son  Code Status: DNR Goals of care: Advanced Directive information    04/28/2023   10:47 AM  Advanced Directives  Does Patient Have a Medical Advance Directive? Yes  Type of Advance Directive Living will  Does patient want to make changes to medical advance directive? No - Patient declined     Chief Complaint  Patient presents with   Acute Visit    Elevated Bp.    HPI:  Pt is a 89 y.o. female seen today for an acute visit for elevated Bp, swelling legs, expiratory wheezes, denied dizziness, change of vision, cough, chest pain, phlegm production, chest pain, palpitation, O2 desaturation    Weight loss, saw Dr. Hyacinth Meeker 03/26/23 changed Mirtazapine to 7.5mg /30mg , TSH 1.5 04/24/23 HTN, takes Losartan, ASA, off Furosemide since 04/17/23 2/2 to low Bp and no apparent swelling             CKD Bun/creat 20/0.79 04/24/23             Depression, takes Lexapro and Mirtazapine, TSH 1.5 04/24/23             Hyperlipidemia, takes Simvastatin. LDL 83 04/24/23             OP DEXA T score -4.3, declined Prolia, takes Vit D, Ca,  Vit D level 40 05/17/20,  off Alendronate 2nd to jaw bone necrotic process             Vocal cord paralysis, underwent ENT  evaluation in the past.              GERD, takes Omeprazole, Hgb 13.1 04/24/23             Glaucoma R+L follow Ophthalmology Past Medical History:  Diagnosis Date   Anemia    Anxiety    Apnea 05/2000   sleep study revealed mild upper airway obstruction & apnea during speel, no set recommendations for therapy   Asthma    Diverticulosis    Dyspnea 12/15/09   spirometer normal & function actually worsens with nebk may have more of a "exercise-induced" type of bronchospasm    Fatigue 07/2015   GERD (gastroesophageal reflux disease)    Glaucoma    both eyes   Hyperlipidemia    Hypertension    Laryngeal paresis 09/12/2016   Loss of weight 06/20/2016   OSA (obstructive sleep apnea)    Osteoporosis, senile    Vocal cord dysfunction    per ENT, pt NOT to have elective intubation without disscussing with him   Past Surgical History:  Procedure Laterality Date   CATARACT EXTRACTION W/ INTRAOCULAR LENS  IMPLANT, BILATERAL  2011   Dr. Emmit Pomfret   CERVICAL CONIZATION W/BX     due to cervical dysplasia   COLONOSCOPY  02/2002   Diverticublosis    EYE SURGERY Right 02/2016  laser surgery for scar tissure   nasolaryngoscopy  2001   reflux laryngitis   SKIN CANCER EXCISION     ?BCC   TONSILLECTOMY     UPPER GI ENDOSCOPY  05/2000   upper airway edema/changes that were consistent with GERD   vocal fold cordotomy Left 2003   Dr. Viann Shove [facility]    Allergies  Allergen Reactions   Brimonidine Other (See Comments)   Dorzolamide Hcl Itching    EYE PAIN    Allergies as of 04/28/2023       Reactions   Brimonidine Other (See Comments)   Dorzolamide Hcl Itching   EYE PAIN        Medication List        Accurate as of April 28, 2023 11:59 PM. If you have any questions, ask your nurse or doctor.          STOP taking these medications    furosemide 20 MG tablet Commonly known as: LASIX Stopped by: Woodie Trusty X Fidelis Loth, NP       TAKE these medications    ascorbic acid 500 MG  tablet Commonly known as: VITAMIN C Take 500 mg by mouth daily.   aspirin EC 81 MG tablet Take 81 mg by mouth daily.   brinzolamide 1 % ophthalmic suspension Commonly known as: AZOPT Place 1 drop into both eyes 3 (three) times daily.   escitalopram 10 MG tablet Commonly known as: Lexapro Take 1 tablet (10 mg total) by mouth daily.   Fish Oil 1000 MG Caps Take 1 capsule by mouth daily.   ketorolac 0.4 % Soln Commonly known as: ACULAR Place 1 drop into the left eye in the morning and at bedtime.   losartan 25 MG tablet Commonly known as: COZAAR TAKE TWO TABLETS (50MG ) BY MOUTH ONCE DAILY FOR BLOOD PRESSURE   loteprednol 0.2 % Susp Commonly known as: LOTEMAX Place 1 drop into the left eye 2 (two) times daily.   mirtazapine 7.5 MG tablet Commonly known as: REMERON Take 1 tablet (7.5 mg total) by mouth at bedtime. For appetite   Netarsudil Dimesylate 0.02 % Soln Apply 1 drop to eye at bedtime. Both eyes   Polyethyl Glycol-Propyl Glycol 0.4-0.3 % Soln Apply 1 drop to eye 2 (two) times daily. In both eyes   simvastatin 10 MG tablet Commonly known as: ZOCOR TAKE 1 TABLET (10 MG TOTAL) BY MOUTH DAILY AT 12 NOON. OVERDUE FOR LABS   Timolol Maleate (Once-Daily) 0.5 % Soln Place 1 drop into both eyes 2 (two) times daily.        Review of Systems  Constitutional:  Positive for unexpected weight change. Negative for fatigue and fever.  HENT:  Positive for hearing loss and voice change. Negative for congestion and trouble swallowing.        Vocal cord paralysis.   Respiratory:  Positive for wheezing. Negative for cough.   Cardiovascular:  Positive for leg swelling.  Gastrointestinal:  Negative for abdominal pain and constipation.  Genitourinary:  Negative for dysuria and urgency.  Musculoskeletal:  Positive for arthralgias and gait problem.  Skin:  Negative for color change.  Neurological:  Negative for speech difficulty, weakness and light-headedness.       Memory  lapses.   Psychiatric/Behavioral:  Positive for sleep disturbance. Negative for confusion. The patient is nervous/anxious.     Immunization History  Administered Date(s) Administered   DTaP 08/08/2015   Fluad Quad(high Dose 65+) 08/20/2021, 08/28/2022   Influenza Whole 08/06/2018   Influenza,  High Dose Seasonal PF 08/13/2017, 08/21/2019, 08/16/2020   Influenza-Unspecified 08/02/2015, 08/15/2016   Moderna Covid-19 Vaccine Bivalent Booster 34yrs & up 09/05/2022   Moderna Sars-Covid-2 Vaccination 11/08/2019, 12/06/2019, 09/12/2020   Pfizer Covid-19 Vaccine Bivalent Booster 55yrs & up 10/18/2021, 02/16/2022   Pneumococcal Conjugate-13 08/02/2015   Pneumococcal Polysaccharide-23 09/05/1999   Tdap 08/08/2015   Pertinent  Health Maintenance Due  Topic Date Due   INFLUENZA VACCINE  06/05/2023   DEXA SCAN  Completed      05/30/2022    1:57 PM 09/12/2022    9:23 AM 10/03/2022    9:06 AM 03/26/2023    2:59 PM 04/17/2023    8:37 AM  Fall Risk  Falls in the past year? 0 0 0 0 0  Was there an injury with Fall? 0 0 0 0 0  Fall Risk Category Calculator 0 0 0 0 0  Fall Risk Category (Retired) Low Low Low    (RETIRED) Patient Fall Risk Level Low fall risk Low fall risk Low fall risk    Patient at Risk for Falls Due to No Fall Risks No Fall Risks No Fall Risks No Fall Risks No Fall Risks  Fall risk Follow up Falls evaluation completed Falls evaluation completed Falls evaluation completed Falls evaluation completed Falls evaluation completed   Functional Status Survey:    Vitals:   04/28/23 1050  BP: (!) 192/88  Pulse: 64  Resp: 18  Temp: (!) 96.1 F (35.6 C)  SpO2: (!) 18%  Weight: 113 lb (51.3 kg)  Height: 5\' 3"  (1.6 m)   Body mass index is 20.02 kg/m. Physical Exam Vitals reviewed.  Constitutional:      Appearance: Normal appearance.  HENT:     Head: Normocephalic and atraumatic.     Nose: Nose normal.     Mouth/Throat:     Mouth: Mucous membranes are moist.     Comments:  Torus plantinus Eyes:     Extraocular Movements: Extraocular movements intact.     Conjunctiva/sclera: Conjunctivae normal.     Pupils: Pupils are equal, round, and reactive to light.     Comments: Left lower eyelid entropion, Hx of left corneal ulceration, R+L glaucoma, able to read  Cardiovascular:     Rate and Rhythm: Normal rate and regular rhythm.     Heart sounds: No murmur heard. Pulmonary:     Effort: Pulmonary effort is normal.     Breath sounds: No wheezing or rales.     Comments: Anterior neck, chronic sternal region wheezes, disappearing when breathing with mouth closed.   Abdominal:     Palpations: Abdomen is soft.     Tenderness: There is no abdominal tenderness.  Musculoskeletal:     Cervical back: Normal range of motion and neck supple.     Right lower leg: Edema present.     Left lower leg: Edema present.     Comments: 1+ edema BLE  Skin:    General: Skin is warm and dry.  Neurological:     General: No focal deficit present.     Mental Status: She is alert. Mental status is at baseline.     Motor: No weakness.     Gait: Gait normal.     Comments: Oriented to person and place. Able to walk  Psychiatric:        Mood and Affect: Mood normal.        Behavior: Behavior normal.        Thought Content: Thought content normal.  Judgment: Judgment normal.     Labs reviewed: Recent Labs    05/14/22 0700 04/24/23 0000  NA 144 142  K 4.1 4.2  CL 103 105  CO2 35* 32*  GLUCOSE 79  --   BUN 20 20  CREATININE 0.99* 0.8  CALCIUM 9.2 9.2   Recent Labs    05/14/22 0700 04/24/23 0000  AST 21 18  ALT 10 8  ALKPHOS  --  94  BILITOT 0.4  --   PROT 6.5  --   ALBUMIN  --  4.0   Recent Labs    05/14/22 0700 04/24/23 0000  WBC 4.8 4.4  NEUTROABS 2,789 2,724.00  HGB 12.8 13.1  HCT 40.5 41  MCV 95.3  --   PLT 211 215   Lab Results  Component Value Date   TSH 2.01 05/14/2022   Lab Results  Component Value Date   HGBA1C 5.6 05/17/2020   Lab  Results  Component Value Date   CHOL 163 04/24/2023   HDL 61 04/24/2023   LDLCALC 83 04/24/2023   TRIG 101 04/24/2023   CHOLHDL 2.7 05/14/2022    Significant Diagnostic Results in last 30 days:  No results found.  Assessment/Plan: HTN (hypertension) takes Losartan, ASA, off Furosemide since 04/17/23 2/2 to low Bp and no apparent swelling, resume Furosemide 20mg  every day, f/u BMP, prn Clonidine 0.1mg  daily prn if Bp>180/100  Edema More noticeable, expiratory wheezes too, denied dizziness, change of vision, cough, chest pain, phlegm production, chest pain, palpitation, O2 desaturation, resume Furosemide 20mg  every day, obtain CXR ap/lateral.   Weight loss  saw Dr. Hyacinth Meeker 03/26/23 changed Mirtazapine to 7.5mg /30mg , TSH 1.5 04/24/23, supportive care.   CKD (chronic kidney disease) stage 3, GFR 30-59 ml/min (HCC) Bun/creat 20/0.79 04/24/23  Depression with anxiety  takes Lexapro and Mirtazapine, TSH 1.5 04/24/23, MMSE 30/30 04/22/23, more anxious, ? Moving into [facility] contributory  Hyperlipidemia  Takes Simvastatin. LDL 83 04/24/23  GERD (gastroesophageal reflux disease)  takes Omeprazole, Hgb 13.1 04/24/23    Family/ staff Communication: plan of care reviewed with the patient and charge nurse.   Labs/tests ordered: CXR ap/lateral, BMP

## 2023-04-28 NOTE — Telephone Encounter (Signed)
Patient's daughter Dois Davenport) called about her mothers blood pressure. She reports that patients Ketorolac 0.4% soln was giving wrongf it was only suppose to be twice daily and not four times a day. She reports that eye doctor said that giving the eye drops more than prescribed could cause elevated bp reading. She also stated that it was reported to the nurses at [facility] over the weekend that the drops was administered wrong and they told her that a provider will have to make the changes to the charts MAR before it could be changed. Daughter stated that she was not receiving much help from anyone at [facility] and was advised that our office does have on call providers that she can reach out to about concerns over the weekend. She states that she will try to active her mother's MyChart to contact with [facility].

## 2023-04-28 NOTE — Assessment & Plan Note (Addendum)
takes Losartan, ASA, off Furosemide since 04/17/23 2/2 to low Bp and no apparent swelling, resume Furosemide 20mg  every day, f/u BMP, prn Clonidine 0.1mg  daily prn if Bp>180/100

## 2023-04-28 NOTE — Progress Notes (Unsigned)
Location:  [facility] Room Number: AL/915/A Place of Service:  ALF (13) Provider:  Myangel Summons X, NP  Patient Care Team: Judah Chevere X, NP as PCP - General (Internal Medicine) Moya, Harrietta Guardian, MD as Referring Physician (Ophthalmology) Donzetta Starch, MD as Consulting Physician (Dermatology) Barnie Alderman, MD as Consulting Physician (Otolaryngology) Jemia Fata X, NP as Nurse Practitioner (Internal Medicine)  Extended Emergency Contact Information Primary Emergency Contact: Lagrea,Sandra Address: [address]          [place], [place] [address] [place] of [place] Home Phone: [phone] Mobile Phone: [phone] Relation: Daughter Secondary Emergency Contact: Timmers,Daniel Mobile Phone: [phone] Relation: Son  Code Status:  Full Code Goals of care: Advanced Directive information    04/28/2023   10:47 AM  Advanced Directives  Does Patient Have a Medical Advance Directive? Yes  Type of Advance Directive Living will  Does patient want to make changes to medical advance directive? No - Patient declined     Chief Complaint  Patient presents with   Acute Visit    Elevated Bp.    HPI:  Pt is a 89 y.o. female seen today for an acute visit for    Past Medical History:  Diagnosis Date   Anemia    Anxiety    Apnea 05/2000   sleep study revealed mild upper airway obstruction & apnea during speel, no set recommendations for therapy   Asthma    Diverticulosis    Dyspnea 12/15/09   spirometer normal & function actually worsens with nebk may have more of a "exercise-induced" type of bronchospasm    Fatigue 07/2015   GERD (gastroesophageal reflux disease)    Glaucoma    both eyes   Hyperlipidemia    Hypertension    Laryngeal paresis 09/12/2016   Loss of weight 06/20/2016   OSA (obstructive sleep apnea)    Osteoporosis, senile    Vocal cord dysfunction    per ENT, pt NOT to have elective intubation without disscussing with him   Past  Surgical History:  Procedure Laterality Date   CATARACT EXTRACTION W/ INTRAOCULAR LENS  IMPLANT, BILATERAL  2011   Dr. Emmit Pomfret   CERVICAL CONIZATION W/BX     due to cervical dysplasia   COLONOSCOPY  02/2002   Diverticublosis    EYE SURGERY Right 02/2016   laser surgery for scar tissure   nasolaryngoscopy  2001   reflux laryngitis   SKIN CANCER EXCISION     ?BCC   TONSILLECTOMY     UPPER GI ENDOSCOPY  05/2000   upper airway edema/changes that were consistent with GERD   vocal fold cordotomy Left 2003   Dr. Viann Shove [facility]    Allergies  Allergen Reactions   Brimonidine Other (See Comments)   Dorzolamide Hcl Itching    EYE PAIN    Outpatient Encounter Medications as of 04/28/2023  Medication Sig   ascorbic acid (VITAMIN C) 500 MG tablet Take 500 mg by mouth daily.   aspirin EC 81 MG tablet Take 81 mg by mouth daily.   brinzolamide (AZOPT) 1 % ophthalmic suspension Place 1 drop into both eyes 3 (three) times daily.    escitalopram (LEXAPRO) 10 MG tablet Take 1 tablet (10 mg total) by mouth daily.   ketorolac (ACULAR) 0.4 % SOLN Place 1 drop into the left eye 4 (four) times daily.   losartan (COZAAR) 25 MG tablet TAKE TWO TABLETS (50MG ) BY MOUTH ONCE DAILY FOR BLOOD PRESSURE   loteprednol (LOTEMAX) 0.2 %  SUSP Place 1 drop into the left eye in the morning, at noon, in the evening, and at bedtime.   mirtazapine (REMERON) 7.5 MG tablet Take 1 tablet (7.5 mg total) by mouth at bedtime. For appetite   Netarsudil Dimesylate 0.02 % SOLN Apply 1 drop to eye at bedtime. Both eyes   Omega-3 Fatty Acids (FISH OIL) 1000 MG CAPS Take 1 capsule by mouth daily.   Polyethyl Glycol-Propyl Glycol 0.4-0.3 % SOLN Apply 1 drop to eye 2 (two) times daily. In both eyes   simvastatin (ZOCOR) 10 MG tablet TAKE 1 TABLET (10 MG TOTAL) BY MOUTH DAILY AT 12 NOON. OVERDUE FOR LABS   Timolol Maleate 0.5 % (DAILY) SOLN Place 1 drop into both eyes 2 (two) times daily.    [DISCONTINUED] furosemide (LASIX) 20 MG  tablet TAKE 1/2 TABLET BY MOUTH EVERY DAY   No facility-administered encounter medications on file as of 04/28/2023.    Review of Systems  Immunization History  Administered Date(s) Administered   DTaP 08/08/2015   Fluad Quad(high Dose 65+) 08/20/2021, 08/28/2022   Influenza Whole 08/06/2018   Influenza, High Dose Seasonal PF 08/13/2017, 08/21/2019, 08/16/2020   Influenza-Unspecified 08/02/2015, 08/15/2016   Moderna Covid-19 Vaccine Bivalent Booster 70yrs & up 09/05/2022   Moderna Sars-Covid-2 Vaccination 11/08/2019, 12/06/2019, 09/12/2020   Pfizer Covid-19 Vaccine Bivalent Booster 67yrs & up 10/18/2021, 02/16/2022   Pneumococcal Conjugate-13 08/02/2015   Pneumococcal Polysaccharide-23 09/05/1999   Tdap 08/08/2015   Pertinent  Health Maintenance Due  Topic Date Due   INFLUENZA VACCINE  06/05/2023   DEXA SCAN  Completed      05/30/2022    1:57 PM 09/12/2022    9:23 AM 10/03/2022    9:06 AM 03/26/2023    2:59 PM 04/17/2023    8:37 AM  Fall Risk  Falls in the past year? 0 0 0 0 0  Was there an injury with Fall? 0 0 0 0 0  Fall Risk Category Calculator 0 0 0 0 0  Fall Risk Category (Retired) Low Low Low    (RETIRED) Patient Fall Risk Level Low fall risk Low fall risk Low fall risk    Patient at Risk for Falls Due to No Fall Risks No Fall Risks No Fall Risks No Fall Risks No Fall Risks  Fall risk Follow up Falls evaluation completed Falls evaluation completed Falls evaluation completed Falls evaluation completed Falls evaluation completed   Functional Status Survey:    Vitals:   04/28/23 1050  BP: (!) 192/88  Pulse: 64  Resp: 18  Temp: (!) 96.1 F (35.6 C)  SpO2: (!) 18%  Weight: 113 lb (51.3 kg)  Height: 5\' 3"  (1.6 m)   Body mass index is 20.02 kg/m. Physical Exam  Labs reviewed: Recent Labs    05/14/22 0700 04/24/23 0000  NA 144 142  K 4.1 4.2  CL 103 105  CO2 35* 32*  GLUCOSE 79  --   BUN 20 20  CREATININE 0.99* 0.8  CALCIUM 9.2 9.2   Recent Labs     05/14/22 0700 04/24/23 0000  AST 21 18  ALT 10 8  ALKPHOS  --  94  BILITOT 0.4  --   PROT 6.5  --   ALBUMIN  --  4.0   Recent Labs    05/14/22 0700 04/24/23 0000  WBC 4.8 4.4  NEUTROABS 2,789 2,724.00  HGB 12.8 13.1  HCT 40.5 41  MCV 95.3  --   PLT 211 215   Lab Results  Component Value Date   TSH 2.01 05/14/2022   Lab Results  Component Value Date   HGBA1C 5.6 05/17/2020   Lab Results  Component Value Date   CHOL 163 04/24/2023   HDL 61 04/24/2023   LDLCALC 83 04/24/2023   TRIG 101 04/24/2023   CHOLHDL 2.7 05/14/2022    Significant Diagnostic Results in last 30 days:  No results found.  Assessment/Plan 1. Primary hypertension ***  2. Edema, unspecified type ***  3. Weight loss ***  4. Stage 3a chronic kidney disease (HCC) ***  5. Depression with anxiety ***  6. Other hyperlipidemia ***  7. Gastroesophageal reflux disease, unspecified whether esophagitis present ***    Family/ staff Communication: ***  Labs/tests ordered:  ***

## 2023-04-29 DIAGNOSIS — R1312 Dysphagia, oropharyngeal phase: Secondary | ICD-10-CM | POA: Diagnosis not present

## 2023-04-29 DIAGNOSIS — R634 Abnormal weight loss: Secondary | ICD-10-CM | POA: Diagnosis not present

## 2023-04-30 DIAGNOSIS — H35352 Cystoid macular degeneration, left eye: Secondary | ICD-10-CM | POA: Diagnosis not present

## 2023-04-30 DIAGNOSIS — H401113 Primary open-angle glaucoma, right eye, severe stage: Secondary | ICD-10-CM | POA: Diagnosis not present

## 2023-04-30 DIAGNOSIS — H209 Unspecified iridocyclitis: Secondary | ICD-10-CM | POA: Diagnosis not present

## 2023-04-30 DIAGNOSIS — H401122 Primary open-angle glaucoma, left eye, moderate stage: Secondary | ICD-10-CM | POA: Diagnosis not present

## 2023-04-30 DIAGNOSIS — Z961 Presence of intraocular lens: Secondary | ICD-10-CM | POA: Diagnosis not present

## 2023-05-01 ENCOUNTER — Other Ambulatory Visit: Payer: Self-pay

## 2023-05-01 ENCOUNTER — Non-Acute Institutional Stay: Payer: Medicare Other | Admitting: Family Medicine

## 2023-05-01 DIAGNOSIS — I10 Essential (primary) hypertension: Secondary | ICD-10-CM

## 2023-05-01 DIAGNOSIS — M818 Other osteoporosis without current pathological fracture: Secondary | ICD-10-CM | POA: Diagnosis not present

## 2023-05-01 DIAGNOSIS — E7849 Other hyperlipidemia: Secondary | ICD-10-CM

## 2023-05-01 DIAGNOSIS — F32A Depression, unspecified: Secondary | ICD-10-CM

## 2023-05-01 NOTE — Progress Notes (Signed)
Provider:  Jacalyn Lefevre, MD Location:      Place of Service:     PCP: Mast, Man X, NP Patient Care Team: Mast, Man X, NP as PCP - General (Internal Medicine) Moya, Harrietta Guardian, MD as Referring Physician (Ophthalmology) Donzetta Starch, MD as Consulting Physician (Dermatology) Barnie Alderman, MD as Consulting Physician (Otolaryngology) Mast, Man X, NP as Nurse Practitioner (Internal Medicine)  Extended Emergency Contact Information Primary Emergency Contact: Lagrea,Sandra Address: [address]          [place], [place] [address] Darden Amber of [place] Home Phone: [phone] Mobile Phone: [phone] Relation: Daughter Secondary Emergency Contact: Ramella,Daniel Mobile Phone: [phone] Relation: Son  Code Status:  Goals of Care: Advanced Directive information    04/28/2023   10:47 AM  Advanced Directives  Does Patient Have a Medical Advance Directive? Yes  Type of Advance Directive Living will  Does patient want to make changes to medical advance directive? No - Patient declined      No chief complaint on file.   HPI: Patient is a 89 y.o. female seen today for medical management of chronic problems including: Hypertension, edema, weight loss, depression, osteoporosis, hyperlipidemia, and glaucoma, which is followed by ophthalmology in [place]. Blood pressures have fluctuated recently.  She has clonidine ordered for pressure greater than 180/100.  She is also taking losartan 25 mg 2 tablets once a day. For her edema we have placed her off and on diuretic.  Currently she is taking furosemide and there is no edema today. She takes Lexapro for depression, simvastatin for lipids and recently started mirtazapine for weight loss.  Past Medical History:  Diagnosis Date   Anemia    Anxiety    Apnea 05/2000   sleep study revealed mild upper airway obstruction & apnea during speel, no set recommendations for therapy   Asthma    Diverticulosis    Dyspnea  12/15/09   spirometer normal & function actually worsens with nebk may have more of a "exercise-induced" type of bronchospasm    Fatigue 07/2015   GERD (gastroesophageal reflux disease)    Glaucoma    both eyes   Hyperlipidemia    Hypertension    Laryngeal paresis 09/12/2016   Loss of weight 06/20/2016   OSA (obstructive sleep apnea)    Osteoporosis, senile    Vocal cord dysfunction    per ENT, pt NOT to have elective intubation without disscussing with him   Past Surgical History:  Procedure Laterality Date   CATARACT EXTRACTION W/ INTRAOCULAR LENS  IMPLANT, BILATERAL  2011   Dr. Emmit Pomfret   CERVICAL CONIZATION W/BX     due to cervical dysplasia   COLONOSCOPY  02/2002   Diverticublosis    EYE SURGERY Right 02/2016   laser surgery for scar tissure   nasolaryngoscopy  2001   reflux laryngitis   SKIN CANCER EXCISION     ?BCC   TONSILLECTOMY     UPPER GI ENDOSCOPY  05/2000   upper airway edema/changes that were consistent with GERD   vocal fold cordotomy Left 2003   Dr. Viann Shove [facility]    reports that she has never smoked. Her smokeless tobacco use includes chew and snuff. She reports that she does not drink alcohol and does not use drugs. Social History   Socioeconomic History   Marital status: Widowed    Spouse name: Not on file   Number of children: 3   Years of education: Not on file   Highest education level: Not on  file  Occupational History   Occupation: Retired  Tobacco Use   Smoking status: Never   Smokeless tobacco: Current    Types: Chew, Snuff  Vaping Use   Vaping Use: Never used  Substance and Sexual Activity   Alcohol use: No   Drug use: No   Sexual activity: Never  Other Topics Concern   Not on file  Social History Narrative   Lives at [facility]   Widowed   Previously smoked stopped 1986   Alcohol none   Caffeine occasionally   Exercise walk   Social Determinants of Health   Financial Resource Strain: Medium Risk (10/03/2017)    Overall Financial Resource Strain (CARDIA)    Difficulty of Paying Living Expenses: Somewhat hard  Food Insecurity: No Food Insecurity (10/03/2017)   Hunger Vital Sign    Worried About Running Out of Food in the Last Year: Never true    Ran Out of Food in the Last Year: Never true  Transportation Needs: No Transportation Needs (10/03/2017)   PRAPARE - Administrator, Civil Service (Medical): No    Lack of Transportation (Non-Medical): No  Physical Activity: Insufficiently Active (10/03/2017)   Exercise Vital Sign    Days of Exercise per Week: 3 days    Minutes of Exercise per Session: 30 min  Stress: Stress Concern Present (10/05/2018)   [employer] of Occupational Health - Occupational Stress Questionnaire    Feeling of Stress : Rather much  Social Connections: Somewhat Isolated (10/03/2017)   Social Connection and Isolation Panel [NHANES]    Frequency of Communication with Friends and Family: More than three times a week    Frequency of Social Gatherings with Friends and Family: More than three times a week    Attends Religious Services: 1 to 4 times per year    Active Member of [employer] or Organizations: No    Attends Banker Meetings: Never    Marital Status: Widowed  Intimate Partner Violence: Not At Risk (10/03/2017)   Humiliation, Afraid, Rape, and Kick questionnaire    Fear of Current or Ex-Partner: No    Emotionally Abused: No    Physically Abused: No    Sexually Abused: No    Functional Status Survey:    Family History  Problem Relation Age of Onset   Melanoma Mother    Transient ischemic attack Father    Multiple sclerosis Sister    Cancer Brother    Colon cancer Neg Hx     Health Maintenance  Topic Date Due   COVID-19 Vaccine (7 - 2023-24 season) 06/05/2023 (Originally 10/31/2022)   Zoster Vaccines- Shingrix (1 of 2) 11/04/2094 (Originally 08/03/1979)   INFLUENZA VACCINE  06/05/2023   Medicare Annual Wellness (AWV)  10/04/2023    DTaP/Tdap/Td (3 - Td or Tdap) 08/07/2025   Pneumonia Vaccine 73+ Years old  Completed   DEXA SCAN  Completed   HPV VACCINES  Aged Out    Allergies  Allergen Reactions   Brimonidine Other (See Comments)   Dorzolamide Hcl Itching    EYE PAIN    Outpatient Encounter Medications as of 05/01/2023  Medication Sig   ascorbic acid (VITAMIN C) 500 MG tablet Take 500 mg by mouth daily.   aspirin EC 81 MG tablet Take 81 mg by mouth daily.   brinzolamide (AZOPT) 1 % ophthalmic suspension Place 1 drop into both eyes 3 (three) times daily.    escitalopram (LEXAPRO) 10 MG tablet Take 1 tablet (10 mg total)  by mouth daily.   ketorolac (ACULAR) 0.4 % SOLN Place 1 drop into the left eye in the morning and at bedtime.   losartan (COZAAR) 25 MG tablet TAKE TWO TABLETS (50MG ) BY MOUTH ONCE DAILY FOR BLOOD PRESSURE   loteprednol (LOTEMAX) 0.2 % SUSP Place 1 drop into the left eye 2 (two) times daily.   mirtazapine (REMERON) 7.5 MG tablet Take 1 tablet (7.5 mg total) by mouth at bedtime. For appetite   Netarsudil Dimesylate 0.02 % SOLN Apply 1 drop to eye at bedtime. Both eyes   Omega-3 Fatty Acids (FISH OIL) 1000 MG CAPS Take 1 capsule by mouth daily.   Polyethyl Glycol-Propyl Glycol 0.4-0.3 % SOLN Apply 1 drop to eye 2 (two) times daily. In both eyes   simvastatin (ZOCOR) 10 MG tablet TAKE 1 TABLET (10 MG TOTAL) BY MOUTH DAILY AT 12 NOON. OVERDUE FOR LABS   Timolol Maleate 0.5 % (DAILY) SOLN Place 1 drop into both eyes 2 (two) times daily.    No facility-administered encounter medications on file as of 05/01/2023.    Review of Systems  Constitutional: Negative.   Eyes:  Positive for visual disturbance.  Respiratory: Negative.    Cardiovascular: Negative.   Genitourinary: Negative.   Hematological: Negative.   Psychiatric/Behavioral: Negative.    All other systems reviewed and are negative.   There were no vitals filed for this visit. There is no height or weight on file to calculate  BMI. Physical Exam Vitals and nursing note reviewed.  Constitutional:      Appearance: Normal appearance.  Eyes:     Pupils: Pupils are equal, round, and reactive to light.     Comments: Left lower lid entropion  Cardiovascular:     Rate and Rhythm: Normal rate and regular rhythm.  Pulmonary:     Effort: Pulmonary effort is normal.     Breath sounds: Normal breath sounds.  Abdominal:     General: Bowel sounds are normal.     Palpations: Abdomen is soft.  Musculoskeletal:        General: Normal range of motion.     Right lower leg: No edema.     Left lower leg: No edema.  Neurological:     General: No focal deficit present.     Mental Status: She is alert and oriented to person, place, and time.     Labs reviewed: Basic Metabolic Panel: Recent Labs    05/14/22 0700 04/24/23 0000  NA 144 142  K 4.1 4.2  CL 103 105  CO2 35* 32*  GLUCOSE 79  --   BUN 20 20  CREATININE 0.99* 0.8  CALCIUM 9.2 9.2   Liver Function Tests: Recent Labs    05/14/22 0700 04/24/23 0000  AST 21 18  ALT 10 8  ALKPHOS  --  94  BILITOT 0.4  --   PROT 6.5  --   ALBUMIN  --  4.0   No results for input(s): "LIPASE", "AMYLASE" in the last 8760 hours. No results for input(s): "AMMONIA" in the last 8760 hours. CBC: Recent Labs    05/14/22 0700 04/24/23 0000  WBC 4.8 4.4  NEUTROABS 2,789 2,724.00  HGB 12.8 13.1  HCT 40.5 41  MCV 95.3  --   PLT 211 215   Cardiac Enzymes: No results for input(s): "CKTOTAL", "CKMB", "CKMBINDEX", "TROPONINI" in the last 8760 hours. BNP: Invalid input(s): "POCBNP" Lab Results  Component Value Date   HGBA1C 5.6 05/17/2020   Lab Results  Component Value  Date   TSH 2.01 05/14/2022   Lab Results  Component Value Date   VITAMINB12 307 04/24/2023   No results found for: "FOLATE" No results found for: "IRON", "TIBC", "FERRITIN"  Imaging and Procedures obtained prior to SNF admission: US Venous Img Lower Unilateral Right  Result Date:  06/03/2022 CLINICAL DATA:  Right leg swelling EXAM: RIGHT LOWER EXTREMITY VENOUS DOPPLER ULTRASOUND TECHNIQUE: Gray-scale sonography with compression, as well as color and duplex ultrasound, were performed to evaluate the deep venous system(s) from the level of the common femoral vein through the popliteal and proximal calf veins. COMPARISON:  None available FINDINGS: VENOUS Normal compressibility of the common femoral, superficial femoral, and popliteal veins, as well as the visualized calf veins. Visualized portions of profunda femoral vein and great saphenous vein unremarkable. No filling defects to suggest DVT on grayscale or color Doppler imaging. Doppler waveforms show normal direction of venous flow, normal respiratory plasticity and response to augmentation. Limited views of the contralateral common femoral vein are unremarkable. OTHER 4.1 x 0.6 x 2.1 cm fluid collection in the right posterior knee likely a Baker cyst. Limitations: none IMPRESSION: No right lower extremity DVT. Electronically Signed   By: Acquanetta Belling M.D.   On: 06/03/2022 13:48    Assessment/Plan 1. Age-related osteoporosis without fracture Lloyd Huger as well as calcium and vitamin D supplements  2. Chronic depression Continues with Lexapro.  Also takes mirtazapine but in low-dose primarily for appetite and weight loss  3. Primary hypertension Will change losartan to twice daily for better coverage.  Continue with clonidine as needed  4. Other hyperlipidemia LDL cholesterol was checked 1 week ago and is at 15    Family/ staff Communication:   Labs/tests ordered:  Bertram Millard. Hyacinth Meeker, MD [facility] [address] [place], [place] [address] Office [phone]

## 2023-05-06 DIAGNOSIS — R1312 Dysphagia, oropharyngeal phase: Secondary | ICD-10-CM | POA: Diagnosis not present

## 2023-05-13 DIAGNOSIS — R1312 Dysphagia, oropharyngeal phase: Secondary | ICD-10-CM | POA: Diagnosis not present

## 2023-05-20 DIAGNOSIS — M216X1 Other acquired deformities of right foot: Secondary | ICD-10-CM | POA: Diagnosis not present

## 2023-05-20 DIAGNOSIS — B351 Tinea unguium: Secondary | ICD-10-CM | POA: Diagnosis not present

## 2023-05-20 DIAGNOSIS — L84 Corns and callosities: Secondary | ICD-10-CM | POA: Diagnosis not present

## 2023-05-21 DIAGNOSIS — R1312 Dysphagia, oropharyngeal phase: Secondary | ICD-10-CM | POA: Diagnosis not present

## 2023-05-21 DIAGNOSIS — H401122 Primary open-angle glaucoma, left eye, moderate stage: Secondary | ICD-10-CM | POA: Diagnosis not present

## 2023-05-21 DIAGNOSIS — H401113 Primary open-angle glaucoma, right eye, severe stage: Secondary | ICD-10-CM | POA: Diagnosis not present

## 2023-05-21 DIAGNOSIS — Z961 Presence of intraocular lens: Secondary | ICD-10-CM | POA: Diagnosis not present

## 2023-05-26 ENCOUNTER — Telehealth: Payer: Medicare Other | Admitting: *Deleted

## 2023-05-26 NOTE — Telephone Encounter (Signed)
Mast, Man X, NP  You28 minutes ago (1:36 PM)    Please cancel the current lab orders. Thank you    Lab orders Canceled and Dois Davenport notified and agreed.

## 2023-05-26 NOTE — Telephone Encounter (Signed)
Patient daughter, Angela [name], called and stated that patient is scheduled for [facility] tomorrow in the clinic.   Daughter stated that patient is in AL at [facility] and is requesting the Labwork to be done in her Room.   Stated that this was done a couple weeks ago, someone went to her room and drew labs. Daughter is wondering If patient even needs anymore labwork done and if so she is requesting someone to draw them in the room.   Please Advise.

## 2023-05-27 ENCOUNTER — Other Ambulatory Visit: Payer: Medicare Other | Admitting: Family Medicine

## 2023-05-27 ENCOUNTER — Telehealth: Payer: Self-pay

## 2023-05-27 ENCOUNTER — Ambulatory Visit
Admission: RE | Admit: 2023-05-27 | Discharge: 2023-05-27 | Disposition: A | Discharge status: Home / Self Care | Payer: Medicare Other | Source: Ambulatory Visit | Attending: Family Medicine | Admitting: Family Medicine

## 2023-05-27 ENCOUNTER — Encounter: Payer: Medicare Other | Admitting: Family

## 2023-05-27 ENCOUNTER — Telehealth: Payer: Self-pay | Admitting: Orthopedic Surgery

## 2023-05-27 ENCOUNTER — Other Ambulatory Visit: Payer: Medicare Other

## 2023-05-27 DIAGNOSIS — N131 Hydronephrosis with ureteral stricture, not elsewhere classified: Secondary | ICD-10-CM | POA: Diagnosis not present

## 2023-05-27 DIAGNOSIS — K6389 Other specified diseases of intestine: Secondary | ICD-10-CM | POA: Diagnosis not present

## 2023-05-27 DIAGNOSIS — R1032 Left lower quadrant pain: Secondary | ICD-10-CM | POA: Diagnosis not present

## 2023-05-27 DIAGNOSIS — I10 Essential (primary) hypertension: Secondary | ICD-10-CM | POA: Diagnosis not present

## 2023-05-27 DIAGNOSIS — N132 Hydronephrosis with renal and ureteral calculous obstruction: Secondary | ICD-10-CM | POA: Diagnosis not present

## 2023-05-27 DIAGNOSIS — R1031 Right lower quadrant pain: Secondary | ICD-10-CM | POA: Diagnosis not present

## 2023-05-27 DIAGNOSIS — R1312 Dysphagia, oropharyngeal phase: Secondary | ICD-10-CM | POA: Diagnosis not present

## 2023-05-27 DIAGNOSIS — R109 Unspecified abdominal pain: Secondary | ICD-10-CM | POA: Insufficient documentation

## 2023-05-27 MED ORDER — IOPAMIDOL (ISOVUE-300) INJECTION 61%
100.0000 mL | Freq: Once | INTRAVENOUS | Status: AC | PRN
  Administered 2023-05-27: 100 mL via INTRAVENOUS

## 2023-05-27 NOTE — Progress Notes (Signed)
Provider:  Jacalyn Lefevre, MD Location:      Place of Service:     PCP: Mast, Man X, NP Patient Care Team: Mast, Man X, NP as PCP - General (Internal Medicine) Moya, Harrietta Guardian, MD as Referring Physician (Ophthalmology) Donzetta Starch, MD as Consulting Physician (Dermatology) Barnie Alderman, MD as Consulting Physician (Otolaryngology) Mast, Man X, NP as Nurse Practitioner (Internal Medicine)  Extended Emergency Contact Information Primary Emergency Contact: Lagrea,Sandra Address: [address]          [place], [place] [address] [place] of [place] Home Phone: [phone] Mobile Phone: [phone] Relation: Daughter Secondary Emergency Contact: Fofana,Daniel Mobile Phone: [phone] Relation: Son  Code Status:  Goals of Care: Advanced Directive information    04/28/2023   10:47 AM  Advanced Directives  Does Patient Have a Medical Advance Directive? Yes  Type of Advance Directive Living will  Does patient want to make changes to medical advance directive? No - Patient declined      No chief complaint on file.   HPI: Patient is a 89 y.o. female seen today for abdominal pain associated with nausea and vomiting diarrhea and constipation.  Pain began about 48 hours ago.  Patient has not eaten since pain and nausea began.  Last night she was given milk of magnesia for alleged constipation and since then she has had several diarrheal type stools.  There is also a history of dark stools suggesting possibility of bleeding.  Pain is described as colicky and localizes more to the left lower quadrant.  There is no history of diverticular disease, ulcer disease or other intestinal problems.  Past Medical History:  Diagnosis Date   Anemia    Anxiety    Apnea 05/2000   sleep study revealed mild upper airway obstruction & apnea during speel, no set recommendations for therapy   Asthma    Diverticulosis    Dyspnea 12/15/09   spirometer normal & function actually worsens  with nebk may have more of a "exercise-induced" type of bronchospasm    Fatigue 07/2015   GERD (gastroesophageal reflux disease)    Glaucoma    both eyes   Hyperlipidemia    Hypertension    Laryngeal paresis 09/12/2016   Loss of weight 06/20/2016   OSA (obstructive sleep apnea)    Osteoporosis, senile    Vocal cord dysfunction    per ENT, pt NOT to have elective intubation without disscussing with him   Past Surgical History:  Procedure Laterality Date   CATARACT EXTRACTION W/ INTRAOCULAR LENS  IMPLANT, BILATERAL  2011   Dr. Emmit Pomfret   CERVICAL CONIZATION W/BX     due to cervical dysplasia   COLONOSCOPY  02/2002   Diverticublosis    EYE SURGERY Right 02/2016   laser surgery for scar tissure   nasolaryngoscopy  2001   reflux laryngitis   SKIN CANCER EXCISION     ?BCC   TONSILLECTOMY     UPPER GI ENDOSCOPY  05/2000   upper airway edema/changes that were consistent with GERD   vocal fold cordotomy Left 2003   Dr. Viann Shove [facility]    reports that she has never smoked. Her smokeless tobacco use includes chew and snuff. She reports that she does not drink alcohol and does not use drugs. Social History   Socioeconomic History   Marital status: Widowed    Spouse name: Not on file   Number of children: 3   Years of education: Not on file   Highest education level:  Not on file  Occupational History   Occupation: Retired  Tobacco Use   Smoking status: Never   Smokeless tobacco: Current    Types: Chew, Snuff  Vaping Use   Vaping status: Never Used  Substance and Sexual Activity   Alcohol use: No   Drug use: No   Sexual activity: Never  Other Topics Concern   Not on file  Social History Narrative   Lives at [facility]   Widowed   Previously smoked stopped 1986   Alcohol none   Caffeine occasionally   Exercise walk   Social Determinants of Health   Financial Resource Strain: Medium Risk (10/03/2017)   Overall Financial Resource Strain (CARDIA)     Difficulty of Paying Living Expenses: Somewhat hard  Food Insecurity: No Food Insecurity (10/03/2017)   Hunger Vital Sign    Worried About Running Out of Food in the Last Year: Never true    Ran Out of Food in the Last Year: Never true  Transportation Needs: No Transportation Needs (10/03/2017)   PRAPARE - Administrator, Civil Service (Medical): No    Lack of Transportation (Non-Medical): No  Physical Activity: Insufficiently Active (10/03/2017)   Exercise Vital Sign    Days of Exercise per Week: 3 days    Minutes of Exercise per Session: 30 min  Stress: Stress Concern Present (10/05/2018)   [employer] of Occupational Health - Occupational Stress Questionnaire    Feeling of Stress : Rather much  Social Connections: Somewhat Isolated (10/03/2017)   Social Connection and Isolation Panel [NHANES]    Frequency of Communication with Friends and Family: More than three times a week    Frequency of Social Gatherings with Friends and Family: More than three times a week    Attends Religious Services: 1 to 4 times per year    Active Member of [employer] or Organizations: No    Attends Banker Meetings: Never    Marital Status: Widowed  Intimate Partner Violence: Not At Risk (10/03/2017)   Humiliation, Afraid, Rape, and Kick questionnaire    Fear of Current or Ex-Partner: No    Emotionally Abused: No    Physically Abused: No    Sexually Abused: No    Functional Status Survey:    Family History  Problem Relation Age of Onset   Melanoma Mother    Transient ischemic attack Father    Multiple sclerosis Sister    Cancer Brother    Colon cancer Neg Hx     Health Maintenance  Topic Date Due   COVID-19 Vaccine (7 - 2023-24 season) 06/05/2023 (Originally 10/31/2022)   Zoster Vaccines- Shingrix (1 of 2) 11/04/2094 (Originally 08/03/1979)   INFLUENZA VACCINE  06/05/2023   Medicare Annual Wellness (AWV)  10/04/2023   DTaP/Tdap/Td (3 - Td or Tdap) 08/07/2025    Pneumonia Vaccine 56+ Years old  Completed   DEXA SCAN  Completed   HPV VACCINES  Aged Out    Allergies  Allergen Reactions   Brimonidine Other (See Comments)   Dorzolamide Hcl Itching    EYE PAIN    Outpatient Encounter Medications as of 05/27/2023  Medication Sig   ascorbic acid (VITAMIN C) 500 MG tablet Take 500 mg by mouth daily.   aspirin EC 81 MG tablet Take 81 mg by mouth daily.   brinzolamide (AZOPT) 1 % ophthalmic suspension Place 1 drop into both eyes 3 (three) times daily.    escitalopram (LEXAPRO) 10 MG tablet Take 1 tablet (10  mg total) by mouth daily.   ketorolac (ACULAR) 0.4 % SOLN Place 1 drop into the left eye in the morning and at bedtime.   losartan (COZAAR) 25 MG tablet TAKE TWO TABLETS (50MG ) BY MOUTH ONCE DAILY FOR BLOOD PRESSURE   loteprednol (LOTEMAX) 0.2 % SUSP Place 1 drop into the left eye 2 (two) times daily.   mirtazapine (REMERON) 7.5 MG tablet Take 1 tablet (7.5 mg total) by mouth at bedtime. For appetite   Netarsudil Dimesylate 0.02 % SOLN Apply 1 drop to eye at bedtime. Both eyes   Omega-3 Fatty Acids (FISH OIL) 1000 MG CAPS Take 1 capsule by mouth daily.   Polyethyl Glycol-Propyl Glycol 0.4-0.3 % SOLN Apply 1 drop to eye 2 (two) times daily. In both eyes   simvastatin (ZOCOR) 10 MG tablet TAKE 1 TABLET (10 MG TOTAL) BY MOUTH DAILY AT 12 NOON. OVERDUE FOR LABS   Timolol Maleate 0.5 % (DAILY) SOLN Place 1 drop into both eyes 2 (two) times daily.    No facility-administered encounter medications on file as of 05/27/2023.    Review of Systems  Respiratory: Negative.    Cardiovascular: Negative.   Gastrointestinal:  Positive for abdominal pain, constipation, diarrhea, nausea and vomiting.  Genitourinary: Negative.   Psychiatric/Behavioral: Negative.    All other systems reviewed and are negative.   There were no vitals filed for this visit. There is no height or weight on file to calculate BMI. Physical Exam Vitals and nursing note reviewed.   Constitutional:      General: She is not in acute distress.    Appearance: Normal appearance. She is not ill-appearing.  Cardiovascular:     Rate and Rhythm: Normal rate.  Pulmonary:     Effort: Pulmonary effort is normal.     Breath sounds: Normal breath sounds.  Abdominal:     General: Bowel sounds are normal.     Palpations: Abdomen is soft.     Tenderness: There is no abdominal tenderness. There is no guarding.  Neurological:     General: No focal deficit present.     Mental Status: She is alert.     Labs reviewed: Basic Metabolic Panel: Recent Labs    04/24/23 0000  NA 142  K 4.2  CL 105  CO2 32*  BUN 20  CREATININE 0.8  CALCIUM 9.2   Liver Function Tests: Recent Labs    04/24/23 0000  AST 18  ALT 8  ALKPHOS 94  ALBUMIN 4.0   No results for input(s): "LIPASE", "AMYLASE" in the last 8760 hours. No results for input(s): "AMMONIA" in the last 8760 hours. CBC: Recent Labs    04/24/23 0000  WBC 4.4  NEUTROABS 2,724.00  HGB 13.1  HCT 41  PLT 215   Cardiac Enzymes: No results for input(s): "CKTOTAL", "CKMB", "CKMBINDEX", "TROPONINI" in the last 8760 hours. BNP: Invalid input(s): "POCBNP" Lab Results  Component Value Date   HGBA1C 5.6 05/17/2020   Lab Results  Component Value Date   TSH 2.01 05/14/2022   Lab Results  Component Value Date   VITAMINB12 307 04/24/2023   No results found for: "FOLATE" No results found for: "IRON", "TIBC", "FERRITIN"  Imaging and Procedures obtained prior to SNF admission: US Venous Img Lower Unilateral Right  Result Date: 06/03/2022 CLINICAL DATA:  Right leg swelling EXAM: RIGHT LOWER EXTREMITY VENOUS DOPPLER ULTRASOUND TECHNIQUE: Gray-scale sonography with compression, as well as color and duplex ultrasound, were performed to evaluate the deep venous system(s) from the level of  the common femoral vein through the popliteal and proximal calf veins. COMPARISON:  None available FINDINGS: VENOUS Normal compressibility  of the common femoral, superficial femoral, and popliteal veins, as well as the visualized calf veins. Visualized portions of profunda femoral vein and great saphenous vein unremarkable. No filling defects to suggest DVT on grayscale or color Doppler imaging. Doppler waveforms show normal direction of venous flow, normal respiratory plasticity and response to augmentation. Limited views of the contralateral common femoral vein are unremarkable. OTHER 4.1 x 0.6 x 2.1 cm fluid collection in the right posterior knee likely a Baker cyst. Limitations: none IMPRESSION: No right lower extremity DVT. Electronically Signed   By: Acquanetta Belling M.D.   On: 06/03/2022 13:48    Assessment/Plan 1. Left lower quadrant abdominal pain  - CT ABDOMEN PELVIS W CONTRAST; Future Suspect this might be diverticular disease.  Will treat nausea vomiting with Zofran. Exam is benign and not consistent with acute abdomen Clear liquids and small sips Labs in addition to CT scan also check CBC and BMP Hemoccult   Family/ staff Communication: Discussed findings with daughter who was present during exam  Labs/tests ordered: CBC, BMP, CT abdomen  Bertram Millard. Hyacinth Meeker, MD [facility] [address] [place], [place] [address] Office [phone]

## 2023-05-27 NOTE — Telephone Encounter (Signed)
[facility] nurse calls to report recent lab work. WBC 12.8, BUN/creat 15/1.5. CT abdomen indicated left hydronephrosis (no mass or obstruction) and partial small bowel obstruction. Advised to continue clear liquid diet and promote light walking. Contact provider if vomiting occurs. SBAR written for provider to see tomorrow.

## 2023-05-27 NOTE — Telephone Encounter (Addendum)
Angela [name] called on behalf of her mother. Patient c/o of vomiting, gagging, dark stool and left side pain. Patiens daughter stressed patient is never in pain and needs to be seen today

## 2023-05-28 ENCOUNTER — Other Ambulatory Visit: Payer: Self-pay | Admitting: Nurse Practitioner

## 2023-05-28 ENCOUNTER — Encounter: Payer: Self-pay | Admitting: Nurse Practitioner

## 2023-05-28 ENCOUNTER — Ambulatory Visit
Admission: RE | Admit: 2023-05-28 | Discharge: 2023-05-28 | Disposition: A | Discharge status: Home / Self Care | Payer: Medicare Other | Source: Ambulatory Visit | Attending: Nurse Practitioner | Admitting: Nurse Practitioner

## 2023-05-28 ENCOUNTER — Non-Acute Institutional Stay: Payer: Medicare Other | Admitting: Nurse Practitioner

## 2023-05-28 DIAGNOSIS — R062 Wheezing: Secondary | ICD-10-CM | POA: Diagnosis not present

## 2023-05-28 DIAGNOSIS — D72829 Elevated white blood cell count, unspecified: Secondary | ICD-10-CM | POA: Diagnosis not present

## 2023-05-28 DIAGNOSIS — R634 Abnormal weight loss: Secondary | ICD-10-CM | POA: Diagnosis not present

## 2023-05-28 DIAGNOSIS — K5900 Constipation, unspecified: Secondary | ICD-10-CM

## 2023-05-28 DIAGNOSIS — K5790 Diverticulosis of intestine, part unspecified, without perforation or abscess without bleeding: Secondary | ICD-10-CM | POA: Diagnosis not present

## 2023-05-28 DIAGNOSIS — N1831 Chronic kidney disease, stage 3a: Secondary | ICD-10-CM | POA: Diagnosis not present

## 2023-05-28 DIAGNOSIS — K219 Gastro-esophageal reflux disease without esophagitis: Secondary | ICD-10-CM

## 2023-05-28 DIAGNOSIS — I10 Essential (primary) hypertension: Secondary | ICD-10-CM | POA: Diagnosis not present

## 2023-05-28 DIAGNOSIS — N1330 Unspecified hydronephrosis: Secondary | ICD-10-CM

## 2023-05-28 DIAGNOSIS — F418 Other specified anxiety disorders: Secondary | ICD-10-CM | POA: Diagnosis not present

## 2023-05-28 DIAGNOSIS — M818 Other osteoporosis without current pathological fracture: Secondary | ICD-10-CM

## 2023-05-28 DIAGNOSIS — E7849 Other hyperlipidemia: Secondary | ICD-10-CM

## 2023-05-28 HISTORY — DX: Elevated white blood cell count, unspecified: D72.829

## 2023-05-28 HISTORY — DX: Unspecified hydronephrosis: N13.30

## 2023-05-28 HISTORY — DX: Wheezing: R06.2

## 2023-05-28 NOTE — Assessment & Plan Note (Addendum)
takes Omeprazole, Hgb 13.1 04/24/23, 13.3 05/27/23

## 2023-05-28 NOTE — Assessment & Plan Note (Addendum)
The patient stated her pain was in the left CVA region, still sore but the acute pain is resolved.   CT abd 04/27/23 Moderate to marked left hydronephrosis to the level of the UPJ without an obstructing stone or mass visualized. There is some associated left perinephric edema. This may represent a combination of acute on chronic UPJ obstruction.  [place] abd  UA C/S pending  Urology referral

## 2023-05-28 NOTE — Assessment & Plan Note (Signed)
DEXA T score -4.3, declined Prolia, takes Vit D, Ca,  Vit D level 40 05/17/20,  off Alendronate 2nd to jaw bone necrotic process

## 2023-05-28 NOTE — Assessment & Plan Note (Signed)
takes Simvastatin. LDL 83 04/24/23

## 2023-05-28 NOTE — Assessment & Plan Note (Signed)
05/27/23 wbc 12.7, neutrophils 87.7%, afebrile, pending UA C/S, obtain CXR ap/lateral. Repeat CBC/diff, CMP/eGFR in am.

## 2023-05-28 NOTE — Assessment & Plan Note (Signed)
05/27/23 CT abd  Borderline dilated loop of small bowel in the left abdomen. This could be normal or due to a focal ileus or minimal partial small bowel obstruction.  MiraLax daily, liquid diet

## 2023-05-28 NOTE — Assessment & Plan Note (Signed)
05/27/23 CT abd Sigmoid diverticulosis without evidence of diverticulitis.

## 2023-05-28 NOTE — Assessment & Plan Note (Signed)
Weight loss, saw Dr. Hyacinth Meeker 03/26/23 changed Mirtazapine to 7.5mg /30mg , TSH 1.5 04/24/23, gradual weight loss, about #2Ibs in the past month

## 2023-05-28 NOTE — Assessment & Plan Note (Signed)
Mild chronic, obtain CXR ap/lateral to evaluate further.

## 2023-05-28 NOTE — Assessment & Plan Note (Signed)
takes Lexapro and Mirtazapine, TSH 1.5 04/24/23

## 2023-05-28 NOTE — Assessment & Plan Note (Signed)
takes Losartan, ASA, resumed Furosemide 2/2 recurred swelling.

## 2023-05-28 NOTE — Assessment & Plan Note (Signed)
Bun/creat 20/0.79 04/24/23, 22/1.5 05/27/23, hold Furosemide x 3 days, repeat CMP/eGFR

## 2023-05-28 NOTE — Progress Notes (Signed)
Location:   [facility] Room Number: 915 Place of Service:  ALF (13) Provider: Arna Snipe Letti Towell NP  Maelani Yarbro X, NP  Patient Care Team: Danyla Wattley X, NP as PCP - General (Internal Medicine) Moya, Harrietta Guardian, MD as Referring Physician (Ophthalmology) Donzetta Starch, MD as Consulting Physician (Dermatology) Barnie Alderman, MD as Consulting Physician (Otolaryngology) Don Tiu X, NP as Nurse Practitioner (Internal Medicine)  Extended Emergency Contact Information Primary Emergency Contact: Lagrea,Sandra Address: [address]          [place], [place] [address] D[place] of [place] Home Phone: [phone] Mobile Phone: [phone] Relation: Daughter Secondary Emergency Contact: Pulley,Daniel Mobile Phone: [phone] Relation: Son  Code Status: DNR Goals of care: Advanced Directive information    04/28/2023   10:47 AM  Advanced Directives  Does Patient Have a Medical Advance Directive? Yes  Type of Advance Directive Living will  Does patient want to make changes to medical advance directive? No - Patient declined     Chief Complaint  Patient presents with  . Acute Visit    F/u abd pain    HPI:  Pt is a 89 y.o. female seen today for an acute visit for f/u LLQ pain. The patient stated her pain was in the left CVA region, still sore but the acute pain is resolved.   05/27/23 wbc 12.7, neutrophils 87.7  Saw Dr. Hyacinth Meeker yesterday 05/27/23 for LLQ pain, no further nausea, vomiting, diarrhea. The patient was placed on liquid diet subsequently   CT abd: 1. Moderate to marked left hydronephrosis to the level of the UPJ without an obstructing stone or mass visualized. There is some associated left perinephric edema. This may represent a combination of acute on chronic UPJ obstruction. 2. Borderline dilated loop of small bowel in the left abdomen. This could be normal or due to a focal ileus or minimal partial small bowel obstruction. 3. Small amount of free  peritoneal fluid in the inferior pelvis posterolaterally on the right. This is nonspecific. 4. Sigmoid diverticulosis without evidence of diverticulitis.  Weight loss, saw Dr. Hyacinth Meeker 03/26/23 changed Mirtazapine to 7.5mg /30mg , TSH 1.5 04/24/23, gradual weight loss, about #2Ibs in the past month HTN, takes Losartan, ASA, resumed Furosemide 2/2 recurred swelling.              CKD Bun/creat 20/0.79 04/24/23, 22/1.5 05/27/23             Depression, takes Lexapro and Mirtazapine, TSH 1.5 04/24/23             Hyperlipidemia, takes Simvastatin. LDL 83 04/24/23             OP DEXA T score -4.3, declined Prolia, takes Vit D, Ca,  Vit D level 40 05/17/20,  off Alendronate 2nd to jaw bone necrotic process             Vocal cord paralysis, underwent ENT evaluation in the past.              GERD, takes Omeprazole, Hgb 13.3 05/27/23             Glaucoma R+L follow Ophthalmology Past Medical History:  Diagnosis Date  . Anemia   . Anxiety   . Apnea 05/2000   sleep study revealed mild upper airway obstruction & apnea during speel, no set recommendations for therapy  . Asthma   . Diverticulosis   . Dyspnea 12/15/09   spirometer normal & function actually worsens with nebk may have more of a "exercise-induced" type  of bronchospasm   . Fatigue 07/2015  . GERD (gastroesophageal reflux disease)   . Glaucoma    both eyes  . Hyperlipidemia   . Hypertension   . Laryngeal paresis 09/12/2016  . Loss of weight 06/20/2016  . OSA (obstructive sleep apnea)   . Osteoporosis, senile   . Vocal cord dysfunction    per ENT, pt NOT to have elective intubation without disscussing with him   Past Surgical History:  Procedure Laterality Date  . CATARACT EXTRACTION W/ INTRAOCULAR LENS  IMPLANT, BILATERAL  2011   Dr. Emmit Pomfret  . CERVICAL CONIZATION W/BX     due to cervical dysplasia  . COLONOSCOPY  02/2002   Diverticublosis   . EYE SURGERY Right 02/2016   laser surgery for scar tissure  . nasolaryngoscopy  2001   reflux laryngitis   . SKIN CANCER EXCISION     ?BCC  . TONSILLECTOMY    . UPPER GI ENDOSCOPY  05/2000   upper airway edema/changes that were consistent with GERD  . vocal fold cordotomy Left 2003   Dr. Viann Shove [facility]    Allergies  Allergen Reactions  . Brimonidine Other (See Comments)  . Dorzolamide Hcl Itching    EYE PAIN    Allergies as of 05/28/2023       Reactions   Brimonidine Other (See Comments)   Dorzolamide Hcl Itching   EYE PAIN        Medication List        Accurate as of May 28, 2023 12:42 PM. If you have any questions, ask your nurse or doctor.          ascorbic acid 500 MG tablet Commonly known as: VITAMIN C Take 500 mg by mouth daily.   aspirin EC 81 MG tablet Take 81 mg by mouth daily.   brinzolamide 1 % ophthalmic suspension Commonly known as: AZOPT Place 1 drop into both eyes 3 (three) times daily.   escitalopram 10 MG tablet Commonly known as: Lexapro Take 1 tablet (10 mg total) by mouth daily.   Fish Oil 1000 MG Caps Take 1 capsule by mouth daily.   ketorolac 0.4 % Soln Commonly known as: ACULAR Place 1 drop into the left eye in the morning and at bedtime.   losartan 25 MG tablet Commonly known as: COZAAR TAKE TWO TABLETS (50MG ) BY MOUTH ONCE DAILY FOR BLOOD PRESSURE   loteprednol 0.2 % Susp Commonly known as: LOTEMAX Place 1 drop into the left eye 2 (two) times daily.   mirtazapine 7.5 MG tablet Commonly known as: REMERON Take 1 tablet (7.5 mg total) by mouth at bedtime. For appetite   Netarsudil Dimesylate 0.02 % Soln Apply 1 drop to eye at bedtime. Both eyes   Polyethyl Glycol-Propyl Glycol 0.4-0.3 % Soln Apply 1 drop to eye 2 (two) times daily. In both eyes   simvastatin 10 MG tablet Commonly known as: ZOCOR TAKE 1 TABLET (10 MG TOTAL) BY MOUTH DAILY AT 12 NOON. OVERDUE FOR LABS   Timolol Maleate (Once-Daily) 0.5 % Soln Place 1 drop into both eyes 2 (two) times daily.        Review of Systems  Constitutional:   Positive for appetite change, fatigue and unexpected weight change. Negative for fever.  HENT:  Positive for hearing loss and voice change. Negative for congestion and trouble swallowing.        Vocal cord paralysis.   Eyes:  Positive for visual disturbance.  Respiratory:  Positive for wheezing. Negative for cough.  Cardiovascular:  Negative for leg swelling.  Gastrointestinal:  Negative for abdominal pain, constipation, diarrhea, nausea and vomiting.       Left CVA soreness.   Genitourinary:  Negative for dysuria and urgency.  Musculoskeletal:  Positive for arthralgias and gait problem.  Skin:  Negative for color change.  Neurological:  Negative for speech difficulty, weakness and light-headedness.       Memory lapses.   Psychiatric/Behavioral:  Positive for sleep disturbance. Negative for confusion. The patient is nervous/anxious.     Immunization History  Administered Date(s) Administered  . DTaP 08/08/2015  . Fluad Quad(high Dose 65+) 08/20/2021, 08/28/2022  . Influenza Whole 08/06/2018  . Influenza, High Dose Seasonal PF 08/13/2017, 08/21/2019, 08/16/2020  . Influenza-Unspecified 08/02/2015, 08/15/2016  . Moderna Covid-19 Vaccine Bivalent Booster 88yrs & up 09/05/2022  . Moderna Sars-Covid-2 Vaccination 11/08/2019, 12/06/2019, 09/12/2020  . [employer], [employer] 49yrs & up 10/18/2021, 02/16/2022  . Pneumococcal Conjugate-13 08/02/2015  . Pneumococcal Polysaccharide-23 09/05/1999  . Tdap 08/08/2015   Pertinent  Health Maintenance Due  Topic Date Due  . INFLUENZA VACCINE  06/05/2023  . DEXA SCAN  Completed      05/30/2022    1:57 PM 09/12/2022    9:23 AM 10/03/2022    9:06 AM 03/26/2023    2:59 PM 04/17/2023    8:37 AM  Fall Risk  Falls in the past year? 0 0 0 0 0  Was there an injury with Fall? 0 0 0 0 0  Fall Risk Category Calculator 0 0 0 0 0  Fall Risk Category (Retired) Low Low Low    (RETIRED) Patient Fall Risk Level Low fall risk Low fall risk  Low fall risk    Patient at Risk for Falls Due to No Fall Risks No Fall Risks No Fall Risks No Fall Risks No Fall Risks  Fall risk Follow up Falls evaluation completed Falls evaluation completed Falls evaluation completed Falls evaluation completed Falls evaluation completed   Functional Status Survey:    Vitals:   05/28/23 1000  BP: (!) 168/88  Pulse: 64  Resp: 16  Temp: 98.9 F (37.2 C)  SpO2: 94%  Weight: 111 lb 3.2 oz (50.4 kg)   Body mass index is 19.7 kg/m. Physical Exam Vitals reviewed.  Constitutional:      Appearance: Normal appearance.  HENT:     Head: Normocephalic and atraumatic.     Nose: Nose normal.     Mouth/Throat:     Mouth: Mucous membranes are dry.     Comments: Torus plantinus Eyes:     Extraocular Movements: Extraocular movements intact.     Conjunctiva/sclera: Conjunctivae normal.     Pupils: Pupils are equal, round, and reactive to light.     Comments: Left lower eyelid entropion, Hx of left corneal ulceration, R+L glaucoma, able to read  Cardiovascular:     Rate and Rhythm: Normal rate and regular rhythm.     Heart sounds: No murmur heard. Pulmonary:     Effort: Pulmonary effort is normal.     Breath sounds: Wheezing present. No rales.     Comments: Anterior neck, chronic sternal region wheezes, disappearing when breathing with mouth closed.   Abdominal:     Palpations: Abdomen is soft.     Tenderness: There is no abdominal tenderness. There is left CVA tenderness. There is no right CVA tenderness, guarding or rebound.  Musculoskeletal:     Cervical back: Normal range of motion and neck supple.  Right lower leg: No edema.     Left lower leg: No edema.  Skin:    General: Skin is warm and dry.  Neurological:     General: No focal deficit present.     Mental Status: She is alert. Mental status is at baseline.     Motor: No weakness.     Gait: Gait normal.     Comments: Oriented to person and place. Able to walk  Psychiatric:         Mood and Affect: Mood normal.        Behavior: Behavior normal.        Thought Content: Thought content normal.        Judgment: Judgment normal.    Labs reviewed: Recent Labs    04/24/23 0000  NA 142  K 4.2  CL 105  CO2 32*  BUN 20  CREATININE 0.8  CALCIUM 9.2   Recent Labs    04/24/23 0000  AST 18  ALT 8  ALKPHOS 94  ALBUMIN 4.0   Recent Labs    04/24/23 0000  WBC 4.4  NEUTROABS 2,724.00  HGB 13.1  HCT 41  PLT 215   Lab Results  Component Value Date   TSH 2.01 05/14/2022   Lab Results  Component Value Date   HGBA1C 5.6 05/17/2020   Lab Results  Component Value Date   CHOL 163 04/24/2023   HDL 61 04/24/2023   LDLCALC 83 04/24/2023   TRIG 101 04/24/2023   CHOLHDL 2.7 05/14/2022    Significant Diagnostic Results in last 30 days:  CT ABDOMEN PELVIS W CONTRAST  Result Date: 05/27/2023 CLINICAL DATA:  Acute bilateral lower quadrant abdominal pain with nausea. EXAM: CT ABDOMEN AND PELVIS WITH CONTRAST TECHNIQUE: Multidetector CT imaging of the abdomen and pelvis was performed using the standard protocol following bolus administration of intravenous contrast. RADIATION DOSE REDUCTION: This exam was performed according to the departmental dose-optimization program which includes automated exposure control, adjustment of the mA and/or kV according to patient size and/or use of iterative reconstruction technique. CONTRAST:  ISOVUE-300 IOPAMIDOL (ISOVUE-300) INJECTION 61% COMPARISON:  None Available. FINDINGS: Lower chest: Borderline enlarged heart. Minimal linear atelectasis or scarring at the left lung base. Hepatobiliary: Small cysts in the liver.  Unremarkable gallbladder. Pancreas: Unremarkable. No pancreatic ductal dilatation or surrounding inflammatory changes. Spleen: Normal in size without focal abnormality. Adrenals/Urinary Tract: Normal-appearing adrenal glands. Upper pole right renal cyst containing a thin internal septation or 2 adjacent simple cysts.  This does not need imaging follow-up. Moderate to marked dilatation of the left renal collecting system to the level of the UPJ. No obstructing stone or mass visualized. There is mild confluence upper pole left perirenal edema. Normal, symmetrical concentration and excretion of contrast by the left kidney compared to the right kidney. There are thin internal septations within the dilated collecting system on the left, suggesting a possible component of parapelvic cyst formation. The ureters and urinary bladder unremarkable. No urinary tract calculi seen. Stomach/Bowel: Borderline dilated loop of small bowel in the left abdomen. Otherwise, normal caliber small bowel and colon. Large number of sigmoid colon diverticula without evidence of diverticulitis. Normal-appearing appendix. Unremarkable stomach. Vascular/Lymphatic: Atheromatous arterial calcifications without aneurysm. No enlarged lymph nodes. Reproductive: Uterus and bilateral adnexa are unremarkable. Other: Small amount of free peritoneal fluid in the inferior pelvis posterolaterally on the right. Musculoskeletal: Extensive lumbar and lower thoracic spine degenerative changes with mild-to-moderate scoliosis. IMPRESSION: 1. Moderate to marked left hydronephrosis to the level  of the UPJ without an obstructing stone or mass visualized. There is some associated left perinephric edema. This may represent a combination of acute on chronic UPJ obstruction. 2. Borderline dilated loop of small bowel in the left abdomen. This could be normal or due to a focal ileus or minimal partial small bowel obstruction. 3. Small amount of free peritoneal fluid in the inferior pelvis posterolaterally on the right. This is nonspecific. 4. Sigmoid diverticulosis without evidence of diverticulitis. Electronically Signed   By: Beckie Salts M.D.   On: 05/27/2023 17:03    Assessment/Plan: Hydronephrosis, left   The patient stated her pain was in the left CVA region, still sore but  the acute pain is resolved.   CT abd 04/27/23 Moderate to marked left hydronephrosis to the level of the UPJ without an obstructing stone or mass visualized. There is some associated left perinephric edema. This may represent a combination of acute on chronic UPJ obstruction.  [place] abd  UA C/S pending  Urology referral  Constipation 05/27/23 CT abd  Borderline dilated loop of small bowel in the left abdomen. This could be normal or due to a focal ileus or minimal partial small bowel obstruction.  MiraLax daily, liquid diet  Diverticulosis 05/27/23 CT abd Sigmoid diverticulosis without evidence of diverticulitis.  Weight loss Weight loss, saw Dr. Hyacinth Meeker 03/26/23 changed Mirtazapine to 7.5mg /30mg , TSH 1.5 04/24/23, gradual weight loss, about #2Ibs in the past month  HTN (hypertension)  takes Losartan, ASA, resumed Furosemide 2/2 recurred swelling.   Depression with anxiety takes Lexapro and Mirtazapine, TSH 1.5 04/24/23  Hyperlipidemia takes Simvastatin. LDL 83 04/24/23  Age-related osteoporosis without fracture  DEXA T score -4.3, declined Prolia, takes Vit D, Ca,  Vit D level 40 05/17/20,  off Alendronate 2nd to jaw bone necrotic process  GERD (gastroesophageal reflux disease)  takes Omeprazole, Hgb 13.1 04/24/23, 13.3 05/27/23  Expiratory wheezing Mild chronic, obtain CXR ap/lateral to evaluate further.   Leukocytosis 05/27/23 wbc 12.7, neutrophils 87.7%, afebrile, pending UA C/S, obtain CXR ap/lateral. Repeat CBC/diff, CMP/eGFR in am.   CKD (chronic kidney disease) stage 3, GFR 30-59 ml/min (HCC) Bun/creat 20/0.79 04/24/23, 22/1.5 05/27/23, hold Furosemide x 3 days, repeat CMP/eGFR    Family/ staff Communication: plan of care reviewed with the patient and charge nurse.   Labs/tests ordered:  UA C/S, [place] abd, CXR ap/lateral, repeat CBC/diff, CMP/eGFR  Time spend 40 minutes.

## 2023-05-29 ENCOUNTER — Non-Acute Institutional Stay: Payer: Medicare Other | Admitting: Nurse Practitioner

## 2023-05-29 ENCOUNTER — Encounter: Payer: Medicare Other | Admitting: Nurse Practitioner

## 2023-05-29 ENCOUNTER — Other Ambulatory Visit: Payer: Self-pay | Admitting: Nurse Practitioner

## 2023-05-29 ENCOUNTER — Encounter: Payer: Self-pay | Admitting: Nurse Practitioner

## 2023-05-29 DIAGNOSIS — F418 Other specified anxiety disorders: Secondary | ICD-10-CM

## 2023-05-29 DIAGNOSIS — I10 Essential (primary) hypertension: Secondary | ICD-10-CM

## 2023-05-29 DIAGNOSIS — N1330 Unspecified hydronephrosis: Secondary | ICD-10-CM

## 2023-05-29 DIAGNOSIS — M818 Other osteoporosis without current pathological fracture: Secondary | ICD-10-CM | POA: Diagnosis not present

## 2023-05-29 DIAGNOSIS — E7849 Other hyperlipidemia: Secondary | ICD-10-CM | POA: Diagnosis not present

## 2023-05-29 DIAGNOSIS — K219 Gastro-esophageal reflux disease without esophagitis: Secondary | ICD-10-CM | POA: Diagnosis not present

## 2023-05-29 DIAGNOSIS — R634 Abnormal weight loss: Secondary | ICD-10-CM | POA: Diagnosis not present

## 2023-05-29 DIAGNOSIS — N1831 Chronic kidney disease, stage 3a: Secondary | ICD-10-CM | POA: Diagnosis not present

## 2023-05-29 DIAGNOSIS — J3802 Paralysis of vocal cords and larynx, bilateral: Secondary | ICD-10-CM | POA: Diagnosis not present

## 2023-05-29 DIAGNOSIS — R062 Wheezing: Secondary | ICD-10-CM

## 2023-05-29 NOTE — Assessment & Plan Note (Signed)
Bun/creat 20/0.79 04/24/23, 22/1.5 05/27/23, pending CBC/diff, CMP/eGFR

## 2023-05-29 NOTE — Assessment & Plan Note (Signed)
underwent ENT evaluation in the past.  

## 2023-05-29 NOTE — Assessment & Plan Note (Signed)
takes Simvastatin. LDL 83 04/24/23

## 2023-05-29 NOTE — Assessment & Plan Note (Signed)
Chronic, expiratory, mild, central, will try DuoNeb bid

## 2023-05-29 NOTE — Assessment & Plan Note (Signed)
takes Lexapro and Mirtazapine, TSH 1.5 04/24/23

## 2023-05-29 NOTE — Assessment & Plan Note (Signed)
takes Omeprazole, Hgb 13.3 05/27/23

## 2023-05-29 NOTE — Progress Notes (Signed)
Location:   [facility] Room Number: 915 Place of Service:  ALF (13) Provider: Arna Snipe Kaysha Parsell NP  Ender Rorke X, NP  Patient Care Team: Jasyah Theurer X, NP as PCP - General (Internal Medicine) Moya, Harrietta Guardian, MD as Referring Physician (Ophthalmology) Donzetta Starch, MD as Consulting Physician (Dermatology) Barnie Alderman, MD as Consulting Physician (Otolaryngology) Ebony Yorio X, NP as Nurse Practitioner (Internal Medicine)  Extended Emergency Contact Information Primary Emergency Contact: Lagrea,Sandra Address: [address]          [place], [place] [address] [place] of [place] Home Phone: [phone] Mobile Phone: [phone] Relation: Daughter Secondary Emergency Contact: Lirette,Daniel Mobile Phone: [phone] Relation: Son  Code Status: DNR Goals of care: Advanced Directive information    04/28/2023   10:47 AM  Advanced Directives  Does Patient Have a Medical Advance Directive? Yes  Type of Advance Directive Living will  Does patient want to make changes to medical advance directive? No - Patient declined     Chief Complaint  Patient presents with  . Acute Visit    F/u left CVA area pain.     HPI:  Pt is a 89 y.o. female seen today for an acute visit for f/u left CVA region pain    f/u LLQ pain. The patient stated her pain was in the left CVA region, the acute pain has subsided, said its sore the left flank when she pushes on it.   Pending CXR, [place] abd, UA C/S 05/27/23 no blood or UTI             05/27/23 wbc 12.7, neutrophils 87.7  Pending urology consultation.              Saw Dr. Hyacinth Meeker yesterday 05/27/23 for LLQ pain, no further nausea, vomiting, diarrhea. The patient was placed on liquid diet subsequently, able to advance to diet as tolerated.               CT abd: 1. Moderate to marked left hydronephrosis to the level of the UPJ without an obstructing stone or mass visualized. There is some associated left perinephric edema. This may  represent a combination of acute on chronic UPJ obstruction. 2. Borderline dilated loop of small bowel in the left abdomen. This could be normal or due to a focal ileus or minimal partial small bowel obstruction. 3. Small amount of free peritoneal fluid in the inferior pelvis posterolaterally on the right. This is nonspecific. 4. Sigmoid diverticulosis without evidence of diverticulitis.             Weight loss, saw Dr. Hyacinth Meeker 03/26/23 changed Mirtazapine to 7.5mg /30mg , TSH 1.5 04/24/23, gradual weight loss, about #2Ibs in the past month HTN, takes Losartan, ASA, off  Furosemide x3 days since 05/27/23 2/2 no swelling and increased Bun/creat in setting of N/V/poor appetite.              CKD Bun/creat 20/0.79 04/24/23, 22/1.5 05/27/23             Depression, takes Lexapro and Mirtazapine, TSH 1.5 04/24/23             Hyperlipidemia, takes Simvastatin. LDL 83 04/24/23             OP DEXA T score -4.3, declined Prolia, takes Vit D, Ca,  Vit D level 40 05/17/20,  off Alendronate 2nd to jaw bone necrotic process             Vocal cord paralysis, underwent ENT evaluation in the  past.              GERD, takes Omeprazole, Hgb 13.3 05/27/23             Glaucoma R+L follow Ophthalmology  Chronic wheezes, DuoNeb bid Past Medical History:  Diagnosis Date  . Anemia   . Anxiety   . Apnea 05/2000   sleep study revealed mild upper airway obstruction & apnea during speel, no set recommendations for therapy  . Asthma   . Diverticulosis   . Dyspnea 12/15/09   spirometer normal & function actually worsens with nebk may have more of a "exercise-induced" type of bronchospasm   . Fatigue 07/2015  . GERD (gastroesophageal reflux disease)   . Glaucoma    both eyes  . Hyperlipidemia   . Hypertension   . Laryngeal paresis 09/12/2016  . Loss of weight 06/20/2016  . OSA (obstructive sleep apnea)   . Osteoporosis, senile   . Vocal cord dysfunction    per ENT, pt NOT to have elective intubation without disscussing with him    Past Surgical History:  Procedure Laterality Date  . CATARACT EXTRACTION W/ INTRAOCULAR LENS  IMPLANT, BILATERAL  2011   Dr. Emmit Pomfret  . CERVICAL CONIZATION W/BX     due to cervical dysplasia  . COLONOSCOPY  02/2002   Diverticublosis   . EYE SURGERY Right 02/2016   laser surgery for scar tissure  . nasolaryngoscopy  2001   reflux laryngitis  . SKIN CANCER EXCISION     ?BCC  . TONSILLECTOMY    . UPPER GI ENDOSCOPY  05/2000   upper airway edema/changes that were consistent with GERD  . vocal fold cordotomy Left 2003   Dr. Viann Shove [facility]    Allergies  Allergen Reactions  . Brimonidine Other (See Comments)  . Dorzolamide Hcl Itching    EYE PAIN    Allergies as of 05/29/2023       Reactions   Brimonidine Other (See Comments)   Dorzolamide Hcl Itching   EYE PAIN        Medication List        Accurate as of May 29, 2023  4:41 PM. If you have any questions, ask your nurse or doctor.          ascorbic acid 500 MG tablet Commonly known as: VITAMIN C Take 500 mg by mouth daily.   aspirin EC 81 MG tablet Take 81 mg by mouth daily.   brinzolamide 1 % ophthalmic suspension Commonly known as: AZOPT Place 1 drop into both eyes 3 (three) times daily.   escitalopram 10 MG tablet Commonly known as: Lexapro Take 1 tablet (10 mg total) by mouth daily.   Fish Oil 1000 MG Caps Take 1 capsule by mouth daily.   ketorolac 0.4 % Soln Commonly known as: ACULAR Place 1 drop into the left eye in the morning and at bedtime.   losartan 25 MG tablet Commonly known as: COZAAR TAKE TWO TABLETS (50MG ) BY MOUTH ONCE DAILY FOR BLOOD PRESSURE   loteprednol 0.2 % Susp Commonly known as: LOTEMAX Place 1 drop into the left eye 2 (two) times daily.   mirtazapine 7.5 MG tablet Commonly known as: REMERON Take 1 tablet (7.5 mg total) by mouth at bedtime. For appetite   Netarsudil Dimesylate 0.02 % Soln Apply 1 drop to eye at bedtime. Both eyes   Polyethyl Glycol-Propyl  Glycol 0.4-0.3 % Soln Apply 1 drop to eye 2 (two) times daily. In both eyes   simvastatin 10  MG tablet Commonly known as: ZOCOR TAKE 1 TABLET (10 MG TOTAL) BY MOUTH DAILY AT 12 NOON. OVERDUE FOR LABS   Timolol Maleate (Once-Daily) 0.5 % Soln Place 1 drop into both eyes 2 (two) times daily.        Review of Systems  Constitutional:  Positive for unexpected weight change. Negative for appetite change, fatigue and fever.  HENT:  Positive for hearing loss and voice change. Negative for congestion and trouble swallowing.        Vocal cord paralysis.   Eyes:  Positive for visual disturbance.  Respiratory:  Positive for wheezing. Negative for cough.   Cardiovascular:  Positive for leg swelling.  Gastrointestinal:  Negative for abdominal pain, constipation, diarrhea, nausea and vomiting.       Left CVA soreness resolved, stated its sore left flank when she pushes on it, no bruise, mass, redness, or warmth.   Genitourinary:  Negative for dysuria and urgency.  Musculoskeletal:  Positive for arthralgias and gait problem.  Skin:  Negative for color change.  Neurological:  Negative for speech difficulty, weakness and light-headedness.       Memory lapses.   Psychiatric/Behavioral:  Positive for sleep disturbance. Negative for confusion. The patient is nervous/anxious.     Immunization History  Administered Date(s) Administered  . DTaP 08/08/2015  . Fluad Quad(high Dose 65+) 08/20/2021, 08/28/2022  . Influenza Whole 08/06/2018  . Influenza, High Dose Seasonal PF 08/13/2017, 08/21/2019, 08/16/2020  . Influenza-Unspecified 08/02/2015, 08/15/2016  . Moderna Covid-19 Vaccine Bivalent Booster 28yrs & up 09/05/2022  . Moderna Sars-Covid-2 Vaccination 11/08/2019, 12/06/2019, 09/12/2020  . [employer], [employer] 51yrs & up 10/18/2021, 02/16/2022  . Pneumococcal Conjugate-13 08/02/2015  . Pneumococcal Polysaccharide-23 09/05/1999  . Tdap 08/08/2015   Pertinent  Health  Maintenance Due  Topic Date Due  . INFLUENZA VACCINE  06/05/2023  . DEXA SCAN  Completed      05/30/2022    1:57 PM 09/12/2022    9:23 AM 10/03/2022    9:06 AM 03/26/2023    2:59 PM 04/17/2023    8:37 AM  Fall Risk  Falls in the past year? 0 0 0 0 0  Was there an injury with Fall? 0 0 0 0 0  Fall Risk Category Calculator 0 0 0 0 0  Fall Risk Category (Retired) Low Low Low    (RETIRED) Patient Fall Risk Level Low fall risk Low fall risk Low fall risk    Patient at Risk for Falls Due to No Fall Risks No Fall Risks No Fall Risks No Fall Risks No Fall Risks  Fall risk Follow up Falls evaluation completed Falls evaluation completed Falls evaluation completed Falls evaluation completed Falls evaluation completed   Functional Status Survey:    Vitals:   05/29/23 1049  BP: 138/68  Pulse: 68  Resp: 18  Temp: 97.6 F (36.4 C)  SpO2: 95%  Weight: 111 lb 3.2 oz (50.4 kg)   Body mass index is 19.7 kg/m. Physical Exam Vitals reviewed.  Constitutional:      Appearance: Normal appearance.  HENT:     Head: Normocephalic and atraumatic.     Nose: Nose normal.     Mouth/Throat:     Mouth: Mucous membranes are dry.     Comments: Torus plantinus Eyes:     Extraocular Movements: Extraocular movements intact.     Conjunctiva/sclera: Conjunctivae normal.     Pupils: Pupils are equal, round, and reactive to light.     Comments: Left lower eyelid  entropion, Hx of left corneal ulceration, R+L glaucoma, able to read  Cardiovascular:     Rate and Rhythm: Normal rate and regular rhythm.     Heart sounds: No murmur heard. Pulmonary:     Effort: Pulmonary effort is normal.     Breath sounds: Wheezing present. No rales.     Comments: Anterior neck, chronic sternal region wheezes, disappearing when breathing with mouth closed.   Abdominal:     General: Bowel sounds are normal.     Palpations: Abdomen is soft.     Tenderness: There is no abdominal tenderness. There is no right CVA tenderness,  left CVA tenderness, guarding or rebound.  Musculoskeletal:     Cervical back: Normal range of motion and neck supple.     Right lower leg: Edema present.     Left lower leg: Edema present.     Comments: Trace edema BLE. Left CVA soreness resolved, stated its sore left flank when she pushes on it, no bruise, mass, redness, or warmth.    Skin:    General: Skin is warm and dry.  Neurological:     General: No focal deficit present.     Mental Status: She is alert. Mental status is at baseline.     Motor: No weakness.     Gait: Gait normal.     Comments: Oriented to person and place. Able to walk  Psychiatric:        Mood and Affect: Mood normal.        Behavior: Behavior normal.        Thought Content: Thought content normal.        Judgment: Judgment normal.    Labs reviewed: Recent Labs    04/24/23 0000  NA 142  K 4.2  CL 105  CO2 32*  BUN 20  CREATININE 0.8  CALCIUM 9.2   Recent Labs    04/24/23 0000  AST 18  ALT 8  ALKPHOS 94  ALBUMIN 4.0   Recent Labs    04/24/23 0000  WBC 4.4  NEUTROABS 2,724.00  HGB 13.1  HCT 41  PLT 215   Lab Results  Component Value Date   TSH 2.01 05/14/2022   Lab Results  Component Value Date   HGBA1C 5.6 05/17/2020   Lab Results  Component Value Date   CHOL 163 04/24/2023   HDL 61 04/24/2023   LDLCALC 83 04/24/2023   TRIG 101 04/24/2023   CHOLHDL 2.7 05/14/2022    Significant Diagnostic Results in last 30 days:  CT ABDOMEN PELVIS W CONTRAST  Result Date: 05/27/2023 CLINICAL DATA:  Acute bilateral lower quadrant abdominal pain with nausea. EXAM: CT ABDOMEN AND PELVIS WITH CONTRAST TECHNIQUE: Multidetector CT imaging of the abdomen and pelvis was performed using the standard protocol following bolus administration of intravenous contrast. RADIATION DOSE REDUCTION: This exam was performed according to the departmental dose-optimization program which includes automated exposure control, adjustment of the mA and/or kV  according to patient size and/or use of iterative reconstruction technique. CONTRAST:  ISOVUE-300 IOPAMIDOL (ISOVUE-300) INJECTION 61% COMPARISON:  None Available. FINDINGS: Lower chest: Borderline enlarged heart. Minimal linear atelectasis or scarring at the left lung base. Hepatobiliary: Small cysts in the liver.  Unremarkable gallbladder. Pancreas: Unremarkable. No pancreatic ductal dilatation or surrounding inflammatory changes. Spleen: Normal in size without focal abnormality. Adrenals/Urinary Tract: Normal-appearing adrenal glands. Upper pole right renal cyst containing a thin internal septation or 2 adjacent simple cysts. This does not need imaging follow-up. Moderate to marked  dilatation of the left renal collecting system to the level of the UPJ. No obstructing stone or mass visualized. There is mild confluence upper pole left perirenal edema. Normal, symmetrical concentration and excretion of contrast by the left kidney compared to the right kidney. There are thin internal septations within the dilated collecting system on the left, suggesting a possible component of parapelvic cyst formation. The ureters and urinary bladder unremarkable. No urinary tract calculi seen. Stomach/Bowel: Borderline dilated loop of small bowel in the left abdomen. Otherwise, normal caliber small bowel and colon. Large number of sigmoid colon diverticula without evidence of diverticulitis. Normal-appearing appendix. Unremarkable stomach. Vascular/Lymphatic: Atheromatous arterial calcifications without aneurysm. No enlarged lymph nodes. Reproductive: Uterus and bilateral adnexa are unremarkable. Other: Small amount of free peritoneal fluid in the inferior pelvis posterolaterally on the right. Musculoskeletal: Extensive lumbar and lower thoracic spine degenerative changes with mild-to-moderate scoliosis. IMPRESSION: 1. Moderate to marked left hydronephrosis to the level of the UPJ without an obstructing stone or mass  visualized. There is some associated left perinephric edema. This may represent a combination of acute on chronic UPJ obstruction. 2. Borderline dilated loop of small bowel in the left abdomen. This could be normal or due to a focal ileus or minimal partial small bowel obstruction. 3. Small amount of free peritoneal fluid in the inferior pelvis posterolaterally on the right. This is nonspecific. 4. Sigmoid diverticulosis without evidence of diverticulitis. Electronically Signed   By: Beckie Salts M.D.   On: 05/27/2023 17:03    Assessment/Plan: Hydronephrosis, left   f/u LLQ pain. The patient stated her pain was in the left CVA region, the acute pain has subsided, said its sore the left flank when she pushes on it.   Pending CXR, [place] abd, UA C/S 05/27/23 no blood or UTI             05/27/23 wbc 12.7, neutrophils 87.7             Saw Dr. Hyacinth Meeker yesterday 05/27/23 for LLQ pain, no further nausea, vomiting, diarrhea. The patient was placed on liquid diet subsequently, able to advance to diet as tolerated.               CT abd: 1. Moderate to marked left hydronephrosis to the level of the UPJ without an obstructing stone or mass visualized. There is some associated left perinephric edema. This may represent a combination of acute on chronic UPJ obstruction. 2. Borderline dilated loop of small bowel in the left abdomen. This could be normal or due to a focal ileus or minimal partial small bowel obstruction. 3. Small amount of free peritoneal fluid in the inferior pelvis posterolaterally on the right. This is nonspecific. 4. Sigmoid diverticulosis without evidence of diverticulitis. Pending urology consultation.   HTN (hypertension)  takes Losartan, ASA, off  Furosemide x3 days since 05/27/23 2/2 no swelling and increased Bun/creat in setting of N/V/poor appetite.   CKD (chronic kidney disease) stage 3, GFR 30-59 ml/min (HCC) Bun/creat 20/0.79 04/24/23, 22/1.5 05/27/23, pending CBC/diff,  CMP/eGFR  Depression with anxiety  takes Lexapro and Mirtazapine, TSH 1.5 04/24/23  Hyperlipidemia  takes Simvastatin. LDL 83 04/24/23  Age-related osteoporosis without fracture  DEXA T score -4.3, declined Prolia, takes Vit D, Ca,  Vit D level 40 05/17/20,  off Alendronate 2nd to jaw bone necrotic process  Vocal fold paralysis, bilateral  underwent ENT evaluation in the past.   GERD (gastroesophageal reflux disease)  takes Omeprazole, Hgb 13.3 05/27/23  Wheezes Chronic, expiratory, mild,  central, will try DuoNeb bid  Weight loss saw Dr. Hyacinth Meeker 03/26/23 changed Mirtazapine to 7.5mg /30mg , TSH 1.5 04/24/23, gradual weight loss, about #2Ibs in the past month Ensure bid    Family/ staff Communication: plan of care reviewed with the patient and charge nurse.   Labs/tests ordered:  none  Time spend 40 minutes.

## 2023-05-29 NOTE — Assessment & Plan Note (Signed)
takes Losartan, ASA, off  Furosemide x3 days since 05/27/23 2/2 no swelling and increased Bun/creat in setting of N/V/poor appetite.

## 2023-05-29 NOTE — Assessment & Plan Note (Signed)
DEXA T score -4.3, declined Prolia, takes Vit D, Ca,  Vit D level 40 05/17/20,  off Alendronate 2nd to jaw bone necrotic process

## 2023-05-29 NOTE — Assessment & Plan Note (Signed)
f/u LLQ pain. The patient stated her pain was in the left CVA region, the acute pain has subsided, said its sore the left flank when she pushes on it.   Pending CXR, [place] abd, UA C/S 05/27/23 no blood or UTI             05/27/23 wbc 12.7, neutrophils 87.7             Saw Dr. Hyacinth Meeker yesterday 05/27/23 for LLQ pain, no further nausea, vomiting, diarrhea. The patient was placed on liquid diet subsequently, able to advance to diet as tolerated.               CT abd: 1. Moderate to marked left hydronephrosis to the level of the UPJ without an obstructing stone or mass visualized. There is some associated left perinephric edema. This may represent a combination of acute on chronic UPJ obstruction. 2. Borderline dilated loop of small bowel in the left abdomen. This could be normal or due to a focal ileus or minimal partial small bowel obstruction. 3. Small amount of free peritoneal fluid in the inferior pelvis posterolaterally on the right. This is nonspecific. 4. Sigmoid diverticulosis without evidence of diverticulitis. Pending urology consultation.

## 2023-05-29 NOTE — Assessment & Plan Note (Signed)
saw Dr. Hyacinth Meeker 03/26/23 changed Mirtazapine to 7.5mg /30mg , TSH 1.5 04/24/23, gradual weight loss, about #2Ibs in the past month Ensure bid

## 2023-05-30 NOTE — Progress Notes (Signed)
This encounter was created in error - please disregard.

## 2023-06-01 ENCOUNTER — Other Ambulatory Visit: Payer: Self-pay | Admitting: Nurse Practitioner

## 2023-06-02 DIAGNOSIS — N130 Hydronephrosis with ureteropelvic junction obstruction: Secondary | ICD-10-CM | POA: Diagnosis not present

## 2023-06-02 DIAGNOSIS — R1084 Generalized abdominal pain: Secondary | ICD-10-CM | POA: Diagnosis not present

## 2023-06-02 NOTE — Telephone Encounter (Signed)
Patient is requesting medication refill for medication that's no longer on medication list. Medication pend and sent to PCP Mast, Man X, NP

## 2023-06-03 ENCOUNTER — Other Ambulatory Visit (HOSPITAL_COMMUNITY): Payer: Self-pay | Admitting: Urology

## 2023-06-03 DIAGNOSIS — R109 Unspecified abdominal pain: Secondary | ICD-10-CM

## 2023-06-03 DIAGNOSIS — N1330 Unspecified hydronephrosis: Secondary | ICD-10-CM

## 2023-06-06 ENCOUNTER — Encounter (HOSPITAL_COMMUNITY): Admission: RE | Admit: 2023-06-06 | Payer: Medicare Other | Source: Ambulatory Visit

## 2023-06-06 DIAGNOSIS — N1330 Unspecified hydronephrosis: Secondary | ICD-10-CM | POA: Diagnosis not present

## 2023-06-06 DIAGNOSIS — R109 Unspecified abdominal pain: Secondary | ICD-10-CM | POA: Insufficient documentation

## 2023-06-06 MED ORDER — TECHNETIUM TC 99M MERTIATIDE
5.0000 | Freq: Once | INTRAVENOUS | Status: AC
  Administered 2023-06-06: 5.32 via INTRAVENOUS

## 2023-06-06 MED ORDER — FUROSEMIDE 10 MG/ML IJ SOLN
INTRAMUSCULAR | Status: AC
  Filled 2023-06-06: qty 4

## 2023-06-06 MED ORDER — FUROSEMIDE 10 MG/ML IJ SOLN
25.0000 mg | Freq: Once | INTRAMUSCULAR | Status: AC
  Administered 2023-06-06: 25 mg via INTRAVENOUS

## 2023-06-09 ENCOUNTER — Other Ambulatory Visit: Payer: Self-pay

## 2023-06-10 ENCOUNTER — Ambulatory Visit
Admission: RE | Admit: 2023-06-10 | Discharge: 2023-06-10 | Disposition: A | Discharge status: Home / Self Care | Payer: Medicare Other | Source: Ambulatory Visit | Attending: Nurse Practitioner | Admitting: Nurse Practitioner

## 2023-06-10 DIAGNOSIS — N281 Cyst of kidney, acquired: Secondary | ICD-10-CM | POA: Diagnosis not present

## 2023-06-10 DIAGNOSIS — N1330 Unspecified hydronephrosis: Secondary | ICD-10-CM | POA: Diagnosis not present

## 2023-06-11 ENCOUNTER — Other Ambulatory Visit: Payer: Self-pay | Admitting: Family Medicine

## 2023-06-12 ENCOUNTER — Other Ambulatory Visit: Payer: Self-pay | Admitting: Nurse Practitioner

## 2023-06-12 DIAGNOSIS — F418 Other specified anxiety disorders: Secondary | ICD-10-CM

## 2023-06-12 NOTE — Telephone Encounter (Signed)
Pharmacy requested refill.  Pended Rx and sent to [facility] for approval due to HIGH ALERT Warning.  

## 2023-06-13 NOTE — Progress Notes (Signed)
  This encounter was created in error - please disregard. No show 

## 2023-06-18 DIAGNOSIS — H35372 Puckering of macula, left eye: Secondary | ICD-10-CM | POA: Diagnosis not present

## 2023-06-18 DIAGNOSIS — H04122 Dry eye syndrome of left lacrimal gland: Secondary | ICD-10-CM | POA: Diagnosis not present

## 2023-06-18 DIAGNOSIS — H35352 Cystoid macular degeneration, left eye: Secondary | ICD-10-CM | POA: Diagnosis not present

## 2023-06-20 DIAGNOSIS — N130 Hydronephrosis with ureteropelvic junction obstruction: Secondary | ICD-10-CM | POA: Diagnosis not present

## 2023-06-20 DIAGNOSIS — R1084 Generalized abdominal pain: Secondary | ICD-10-CM | POA: Diagnosis not present

## 2023-06-23 ENCOUNTER — Encounter: Payer: Self-pay | Admitting: Nurse Practitioner

## 2023-06-23 ENCOUNTER — Non-Acute Institutional Stay: Payer: Self-pay | Admitting: Nurse Practitioner

## 2023-06-23 DIAGNOSIS — I10 Essential (primary) hypertension: Secondary | ICD-10-CM | POA: Diagnosis not present

## 2023-06-23 DIAGNOSIS — F32A Depression, unspecified: Secondary | ICD-10-CM | POA: Diagnosis not present

## 2023-06-23 DIAGNOSIS — K219 Gastro-esophageal reflux disease without esophagitis: Secondary | ICD-10-CM | POA: Diagnosis not present

## 2023-06-23 DIAGNOSIS — E7849 Other hyperlipidemia: Secondary | ICD-10-CM

## 2023-06-23 DIAGNOSIS — N1831 Chronic kidney disease, stage 3a: Secondary | ICD-10-CM | POA: Diagnosis not present

## 2023-06-23 DIAGNOSIS — J3802 Paralysis of vocal cords and larynx, bilateral: Secondary | ICD-10-CM

## 2023-06-23 DIAGNOSIS — M818 Other osteoporosis without current pathological fracture: Secondary | ICD-10-CM

## 2023-06-23 DIAGNOSIS — R062 Wheezing: Secondary | ICD-10-CM

## 2023-06-23 NOTE — Assessment & Plan Note (Signed)
 DEXA T score -4.3, declined Prolia, takes Vit D, Ca,  Vit D level 40 05/17/20,  off Alendronate 2nd to jaw bone necrotic process

## 2023-06-23 NOTE — Progress Notes (Unsigned)
Location:   [facility] Room Number: 915-A Place of Service:  [phone]) Provider:  Jarvis Knodel, NP    Patient Care Team: Xylina Rhoads X, NP as PCP - General (Internal Medicine) Moya, Harrietta Guardian, MD as Referring Physician (Ophthalmology) Donzetta Starch, MD as Consulting Physician (Dermatology) Barnie Alderman, MD as Consulting Physician (Otolaryngology) Alijah Hyde X, NP as Nurse Practitioner (Internal Medicine)  Extended Emergency Contact Information Primary Emergency Contact: Gallentine,Daniel Mobile Phone: [phone] Relation: Son Secondary Emergency Contact: Irving Copas Address: [address]          [place], [place] [address] [place] of [place] Home Phone: [phone] Mobile Phone: [phone] Relation: Daughter  Code Status:  FULL CODE  Goals of care: Advanced Directive information    06/23/2023   11:06 AM  Advanced Directives  Does Patient Have a Medical Advance Directive? Yes  Type of Advance Directive Living will;Healthcare Power of Attorney  Does patient want to make changes to medical advance directive? No - Patient declined  Copy of Healthcare Power of Attorney in Chart? Yes - validated most recent copy scanned in chart (See row information)     Chief Complaint  Patient presents with   Acute Visit    Sore spot left side of nose.     HPI:  Pt is a 89 y.o. female seen today for an acute visit for skin lesion left side of nose bridge, the patient stated she scratched, noted raised dark spot underneath of the injury, no active bleeding or s/s of infection, hx of facial skin cancer s/p removal, pending dermatology evaluation.       No further left CVA region pain, CT abd: 1. Moderate to marked left hydronephrosis to the level of the UPJ without an obstructing stone or mass visualized. Underwent urology evaluation, f/u 6 months.              Weight loss, on Mirtazapine 7.5mg  now, weight stable in the past 2 months.  HTN, takes  Losartan, Furosemide, prn Clonidine,  ASA             CKD Bun/creat 20/0.79 04/24/23, 22/1.5 05/27/23, 25/1.35 05/29/23             Depression, takes Lexapro and Mirtazapine, TSH 1.5 04/24/23             Hyperlipidemia, takes Simvastatin. LDL 83 04/24/23             OP DEXA T score -4.3, declined Prolia, takes Vit D, Ca,  Vit D level 40 05/17/20,  off Alendronate 2nd to jaw bone necrotic process             Vocal cord paralysis, underwent ENT evaluation in the past.              GERD, takes Omeprazole, Hgb 13.3 05/27/23             Glaucoma R+L follow Ophthalmology             Chronic wheezes, off DuoNeb 2/2 little efficacy.   Past Medical History:  Diagnosis Date   Anemia    Anxiety    Apnea 05/2000   sleep study revealed mild upper airway obstruction & apnea during speel, no set recommendations for therapy   Asthma    Diverticulosis    Dyspnea 12/15/09   spirometer normal & function actually worsens with nebk may have more of a "exercise-induced" type of bronchospasm    Fatigue 07/2015   GERD (gastroesophageal reflux  disease)    Glaucoma    both eyes   Hyperlipidemia    Hypertension    Laryngeal paresis 09/12/2016   Loss of weight 06/20/2016   OSA (obstructive sleep apnea)    Osteoporosis, senile    Vocal cord dysfunction    per ENT, pt NOT to have elective intubation without disscussing with him   Past Surgical History:  Procedure Laterality Date   CATARACT EXTRACTION W/ INTRAOCULAR LENS  IMPLANT, BILATERAL  2011   Dr. Emmit Pomfret   CERVICAL CONIZATION W/BX     due to cervical dysplasia   COLONOSCOPY  02/2002   Diverticublosis    EYE SURGERY Right 02/2016   laser surgery for scar tissure   nasolaryngoscopy  2001   reflux laryngitis   SKIN CANCER EXCISION     ?BCC   TONSILLECTOMY     UPPER GI ENDOSCOPY  05/2000   upper airway edema/changes that were consistent with GERD   vocal fold cordotomy Left 2003   Dr. [facility]    Allergies  Allergen Reactions   Alphagan  [Brimonidine] Other (See Comments)   Dorzolamide Hcl Itching    EYE PAIN   Trusopt [Dorzolamide]     Allergies as of 06/23/2023       Reactions   Alphagan [brimonidine] Other (See Comments)   Dorzolamide Hcl Itching   EYE PAIN   Trusopt [dorzolamide]         Medication List        Accurate as of June 23, 2023 12:43 PM. If you have any questions, ask your nurse or doctor.          STOP taking these medications    Fish Oil 1000 MG Caps Stopped by: Klyn Kroening X Kieu Quiggle   Polyethyl Glycol-Propyl Glycol 0.4-0.3 % Soln Stopped by: Lloyd Ayo X Syrena Burges       TAKE these medications    ascorbic acid 500 MG tablet Commonly known as: VITAMIN C Take 500 mg by mouth daily.   aspirin EC 81 MG tablet Take 81 mg by mouth daily.   brinzolamide 1 % ophthalmic suspension Commonly known as: AZOPT Place 1 drop into both eyes 3 (three) times daily.   Calcium 600+D 600-10 MG-MCG Tabs Generic drug: Calcium Carb-Cholecalciferol Take 1 tablet by mouth daily.   cloNIDine 0.1 MG tablet Commonly known as: CATAPRES Take 0.1 mg by mouth as needed.   cloNIDine 0.1 MG tablet Commonly known as: CATAPRES Take 0.1 mg by mouth as directed. If BP greater than 180/100   escitalopram 10 MG tablet Commonly known as: LEXAPRO TAKE 1 TABLET BY MOUTH EVERY DAY   furosemide 20 MG tablet Commonly known as: LASIX TAKE 1/2 TABLET BY MOUTH DAILY   ketorolac 0.4 % Soln Commonly known as: ACULAR Place 1 drop into the left eye in the morning and at bedtime.   losartan 25 MG tablet Commonly known as: COZAAR TAKE TWO TABLETS (50MG ) BY MOUTH ONCE DAILY FOR BLOOD PRESSURE   loteprednol 0.2 % Susp Commonly known as: LOTEMAX Place 1 drop into the left eye 2 (two) times daily.   MILK OF MAGNESIA PO Take 30 mLs by mouth as needed.   mirtazapine 7.5 MG tablet Commonly known as: REMERON Take 1 tablet (7.5 mg total) by mouth at bedtime. For appetite   Netarsudil Dimesylate 0.02 % Soln Apply 1 drop to eye at  bedtime. Both eyes   ondansetron 4 MG tablet Commonly known as: ZOFRAN Take 4 mg by mouth every 8 (eight) hours  as needed for nausea or vomiting.   polyethylene glycol 17 g packet Commonly known as: MIRALAX / GLYCOLAX Take 17 g by mouth daily.   simvastatin 10 MG tablet Commonly known as: ZOCOR TAKE 1 TABLET (10 MG TOTAL) BY MOUTH DAILY AT 12 NOON. OVERDUE FOR LABS   SYSTANE COMPLETE OP Place 1 drop into both eyes in the morning, at noon, in the evening, and at bedtime.   Timolol Maleate (Once-Daily) 0.5 % Soln Place 1 drop into both eyes 2 (two) times daily.        Review of Systems  Constitutional:  Positive for unexpected weight change. Negative for fatigue and fever.  HENT:  Positive for hearing loss and voice change. Negative for congestion and trouble swallowing.        Vocal cord paralysis.   Eyes:  Positive for visual disturbance.  Respiratory:  Positive for wheezing. Negative for cough.   Cardiovascular:  Positive for leg swelling.  Gastrointestinal:  Negative for abdominal pain and constipation.       Left CVA soreness resolved  Genitourinary:  Negative for dysuria and urgency.  Musculoskeletal:  Positive for arthralgias and gait problem.  Skin:  Negative for color change.  Neurological:  Negative for speech difficulty, weakness and light-headedness.       Memory lapses.   Psychiatric/Behavioral:  Positive for sleep disturbance. Negative for confusion. The patient is nervous/anxious.     Immunization History  Administered Date(s) Administered   DTaP 08/08/2015   Fluad Quad(high Dose 65+) 08/20/2021, 08/28/2022   Influenza Whole 08/06/2018   Influenza, High Dose Seasonal PF 08/13/2017, 08/21/2019, 08/16/2020   Influenza-Unspecified 08/02/2015, 08/15/2016   Moderna Covid-19 Vaccine Bivalent Booster 5yrs & up 09/05/2022   Moderna Sars-Covid-2 Vaccination 11/08/2019, 12/06/2019, 09/12/2020   Pfizer Covid-19 Vaccine Bivalent Booster 58yrs & up 10/18/2021,  02/16/2022   Pneumococcal Conjugate-13 08/02/2015   Pneumococcal Polysaccharide-23 09/05/1999   Tdap 08/08/2015   Pertinent  Health Maintenance Due  Topic Date Due   INFLUENZA VACCINE  06/05/2023   DEXA SCAN  Completed      05/30/2022    1:57 PM 09/12/2022    9:23 AM 10/03/2022    9:06 AM 03/26/2023    2:59 PM 04/17/2023    8:37 AM  Fall Risk  Falls in the past year? 0 0 0 0 0  Was there an injury with Fall? 0 0 0 0 0  Fall Risk Category Calculator 0 0 0 0 0  Fall Risk Category (Retired) Low Low Low    (RETIRED) Patient Fall Risk Level Low fall risk Low fall risk Low fall risk    Patient at Risk for Falls Due to No Fall Risks No Fall Risks No Fall Risks No Fall Risks No Fall Risks  Fall risk Follow up Falls evaluation completed Falls evaluation completed Falls evaluation completed Falls evaluation completed Falls evaluation completed   Functional Status Survey:    Vitals:   06/23/23 1054  BP: 136/68  Pulse: 72  Resp: 20  Temp: (!) 97.1 F (36.2 C)  SpO2: 90%  Weight: 111 lb 9.6 oz (50.6 kg)  Height: 5\' 3"  (1.6 m)   Body mass index is 19.77 kg/m. Physical Exam Vitals reviewed.  Constitutional:      Appearance: Normal appearance.  HENT:     Head: Normocephalic and atraumatic.     Nose: Nose normal.     Mouth/Throat:     Mouth: Mucous membranes are moist.     Comments: Torus plantinus Eyes:  Extraocular Movements: Extraocular movements intact.     Conjunctiva/sclera: Conjunctivae normal.     Pupils: Pupils are equal, round, and reactive to light.     Comments: Left lower eyelid entropion, Hx of left corneal ulceration, R+L glaucoma, able to read  Cardiovascular:     Rate and Rhythm: Normal rate and regular rhythm.     Heart sounds: No murmur heard. Pulmonary:     Effort: Pulmonary effort is normal.     Breath sounds: Wheezing present. No rales.     Comments: Anterior neck, chronic sternal region wheezes, disappearing when breathing with mouth closed.    Abdominal:     General: Bowel sounds are normal.     Palpations: Abdomen is soft.     Tenderness: There is no abdominal tenderness.  Musculoskeletal:     Cervical back: Normal range of motion and neck supple.     Right lower leg: Edema present.     Left lower leg: Edema present.     Comments: Trace edema BLE.    Skin:    General: Skin is warm and dry.     Findings: Lesion present.     Comments: Scratched skin lesion with dark base under the left eye next to the nasal bridge, no s/s of active bleeding or injection.   Neurological:     General: No focal deficit present.     Mental Status: She is alert. Mental status is at baseline.     Gait: Gait normal.     Comments: Oriented to person and place. Able to walk  Psychiatric:        Mood and Affect: Mood normal.        Behavior: Behavior normal.        Thought Content: Thought content normal.        Judgment: Judgment normal.     Labs reviewed: Recent Labs    04/24/23 0000  NA 142  K 4.2  CL 105  CO2 32*  BUN 20  CREATININE 0.8  CALCIUM 9.2   Recent Labs    04/24/23 0000  AST 18  ALT 8  ALKPHOS 94  ALBUMIN 4.0   Recent Labs    04/24/23 0000  WBC 4.4  NEUTROABS 2,724.00  HGB 13.1  HCT 41  PLT 215   Lab Results  Component Value Date   TSH 2.01 05/14/2022   Lab Results  Component Value Date   HGBA1C 5.6 05/17/2020   Lab Results  Component Value Date   CHOL 163 04/24/2023   HDL 61 04/24/2023   LDLCALC 83 04/24/2023   TRIG 101 04/24/2023   CHOLHDL 2.7 05/14/2022    Significant Diagnostic Results in last 30 days:  US Abdomen Complete  Result Date: 06/15/2023 CLINICAL DATA:  Hydronephrosis. EXAM: ABDOMEN ULTRASOUND COMPLETE COMPARISON:  CT 05/27/2023 FINDINGS: Gallbladder: No gallstones or wall thickening visualized. No sonographic Murphy sign noted by sonographer. Common bile duct: Diameter: 2.1 mm. Liver: No focal lesion identified. Within normal limits in parenchymal echogenicity. Portal vein is  patent on color Doppler imaging with normal direction of blood flow towards the liver. IVC: No abnormality visualized. Pancreas: Visualized portion unremarkable. Spleen: Size and appearance within normal limits. Right Kidney: Length: 11.0 cm. Echogenicity within normal limits. No mass or hydronephrosis visualized. 3 cm cyst with single thin septation. Left Kidney: Length: 10.2 cm. Echogenicity within normal limits. Minimal hydronephrosis. Abdominal aorta: No aneurysm visualized. Mild atherosclerotic disease. Other findings: No free fluid. IMPRESSION: 1. No acute findings. 2. 3  cm right renal cyst. 3. Minimal known left hydronephrosis. Electronically Signed   By: Elberta Fortis M.D.   On: 06/15/2023 12:31   NM Renal Imaging Flow W/Pharm  Result Date: 06/12/2023 CLINICAL DATA:  LEFT hydronephrosis EXAM: NUCLEAR MEDICINE RENAL SCAN WITH DIURETIC ADMINISTRATION TECHNIQUE: Radionuclide angiographic and sequential renal images were obtained after intravenous injection of radiopharmaceutical. Imaging was continued during slow intravenous injection of Lasix approximately 15 minutes after the start of the examination. RADIOPHARMACEUTICALS:  5.3 mCi Technetium-91m MAG3 IV COMPARISON:  CT 05/27/2023 FINDINGS: Flow:  Prompt symmetric arterial flow to the kidneys. Left renogram: Uniform uptake of counts in the renal cortex. Counts are promptly excreted into the collecting system and cleared prior to administration of Lasix. Lasix augment clearance. No postvoid residual. Right renogram: Uniform uptake of counts in the renal cortex. Counts are promptly excreted into the collecting system and cleared prior to administration of Lasix. Lasix augment clearance. No postvoid residual. Differential: Left kidney = 58 % Right kidney = 42 % T1/2 post Lasix : Left kidney = 14.2 min Right kidney = 8.3 min IMPRESSION: 1. Normal renal function. 2. No obstructive hydronephrosis. Electronically Signed   By: Genevive Bi M.D.   On:  06/12/2023 13:59   DG Chest 2 View  Result Date: 06/03/2023 CLINICAL DATA:  Wheezing.  Hydronephrosis. EXAM: CHEST - 2 VIEW COMPARISON:  10/08/2018. FINDINGS: Normal sized heart. Clear lungs with normal vascularity. Moderate thoracolumbar scoliosis and degenerative changes. IMPRESSION: No active disease. Electronically Signed   By: Beckie Salts M.D.   On: 06/03/2023 23:12   CT ABDOMEN PELVIS W CONTRAST  Result Date: 05/27/2023 CLINICAL DATA:  Acute bilateral lower quadrant abdominal pain with nausea. EXAM: CT ABDOMEN AND PELVIS WITH CONTRAST TECHNIQUE: Multidetector CT imaging of the abdomen and pelvis was performed using the standard protocol following bolus administration of intravenous contrast. RADIATION DOSE REDUCTION: This exam was performed according to the departmental dose-optimization program which includes automated exposure control, adjustment of the mA and/or kV according to patient size and/or use of iterative reconstruction technique. CONTRAST:  ISOVUE-300 IOPAMIDOL (ISOVUE-300) INJECTION 61% COMPARISON:  None Available. FINDINGS: Lower chest: Borderline enlarged heart. Minimal linear atelectasis or scarring at the left lung base. Hepatobiliary: Small cysts in the liver.  Unremarkable gallbladder. Pancreas: Unremarkable. No pancreatic ductal dilatation or surrounding inflammatory changes. Spleen: Normal in size without focal abnormality. Adrenals/Urinary Tract: Normal-appearing adrenal glands. Upper pole right renal cyst containing a thin internal septation or 2 adjacent simple cysts. This does not need imaging follow-up. Moderate to marked dilatation of the left renal collecting system to the level of the UPJ. No obstructing stone or mass visualized. There is mild confluence upper pole left perirenal edema. Normal, symmetrical concentration and excretion of contrast by the left kidney compared to the right kidney. There are thin internal septations within the dilated collecting system  on the left, suggesting a possible component of parapelvic cyst formation. The ureters and urinary bladder unremarkable. No urinary tract calculi seen. Stomach/Bowel: Borderline dilated loop of small bowel in the left abdomen. Otherwise, normal caliber small bowel and colon. Large number of sigmoid colon diverticula without evidence of diverticulitis. Normal-appearing appendix. Unremarkable stomach. Vascular/Lymphatic: Atheromatous arterial calcifications without aneurysm. No enlarged lymph nodes. Reproductive: Uterus and bilateral adnexa are unremarkable. Other: Small amount of free peritoneal fluid in the inferior pelvis posterolaterally on the right. Musculoskeletal: Extensive lumbar and lower thoracic spine degenerative changes with mild-to-moderate scoliosis. IMPRESSION: 1. Moderate to marked left hydronephrosis to the level of the  UPJ without an obstructing stone or mass visualized. There is some associated left perinephric edema. This may represent a combination of acute on chronic UPJ obstruction. 2. Borderline dilated loop of small bowel in the left abdomen. This could be normal or due to a focal ileus or minimal partial small bowel obstruction. 3. Small amount of free peritoneal fluid in the inferior pelvis posterolaterally on the right. This is nonspecific. 4. Sigmoid diverticulosis without evidence of diverticulitis. Electronically Signed   By: Beckie Salts M.D.   On: 05/27/2023 17:03    Assessment/Plan HTN (hypertension) Blood pressure is controlled, takes Losartan, ASA, Furosemide, prn Clonidine.   CKD (chronic kidney disease) stage 3, GFR 30-59 ml/min (HCC) Bun/creat 20/0.79 04/24/23, 22/1.5 05/27/23, 25/1.35 05/29/23  Chronic depression Stable, takes Lexapro and Mirtazapine, TSH 1.5 04/24/23  Hyperlipidemia takes Simvastatin. LDL 83 04/24/23  Age-related osteoporosis without fracture DEXA T score -4.3, declined Prolia, takes Vit D, Ca,  Vit D level 40 05/17/20,  off Alendronate 2nd to jaw  bone necrotic process  Vocal fold paralysis, bilateral  underwent ENT evaluation in the past  GERD (gastroesophageal reflux disease) takes Omeprazole, Hgb 13.3 05/27/23     Family/ staff Communication: plan of care reviewed with the patient and charge nurse.   Labs/tests ordered:   none  Time spend 40 minutes

## 2023-06-23 NOTE — Assessment & Plan Note (Signed)
Chronic, no change, off DuoNeb 2/2 little efficacy.

## 2023-06-23 NOTE — Assessment & Plan Note (Signed)
Bun/creat 20/0.79 04/24/23, 22/1.5 05/27/23, 25/1.35 05/29/23

## 2023-06-23 NOTE — Assessment & Plan Note (Addendum)
Blood pressure is controlled, takes Losartan, ASA, Furosemide, prn Clonidine.

## 2023-06-23 NOTE — Assessment & Plan Note (Signed)
 takes Omeprazole, Hgb 13.3 05/27/23

## 2023-06-23 NOTE — Assessment & Plan Note (Signed)
Stable, takes Lexapro and Mirtazapine, TSH 1.5 04/24/23

## 2023-06-23 NOTE — Assessment & Plan Note (Signed)
 takes Simvastatin. LDL 83 04/24/23

## 2023-06-23 NOTE — Assessment & Plan Note (Signed)
underwent ENT evaluation in the past.  

## 2023-06-26 ENCOUNTER — Other Ambulatory Visit: Payer: Self-pay | Admitting: Family Medicine

## 2023-06-26 DIAGNOSIS — R634 Abnormal weight loss: Secondary | ICD-10-CM

## 2023-06-27 NOTE — Telephone Encounter (Signed)
Pharmacy requested refill.  Pended Rx and sent to [facility] for approval due to HIGH ALERT Warning.  

## 2023-07-01 DIAGNOSIS — D225 Melanocytic nevi of trunk: Secondary | ICD-10-CM | POA: Diagnosis not present

## 2023-07-01 DIAGNOSIS — L723 Sebaceous cyst: Secondary | ICD-10-CM | POA: Diagnosis not present

## 2023-07-01 DIAGNOSIS — L821 Other seborrheic keratosis: Secondary | ICD-10-CM | POA: Diagnosis not present

## 2023-07-01 DIAGNOSIS — D2239 Melanocytic nevi of other parts of face: Secondary | ICD-10-CM | POA: Diagnosis not present

## 2023-08-01 ENCOUNTER — Non-Acute Institutional Stay: Payer: Medicare Other | Admitting: Nurse Practitioner

## 2023-08-01 ENCOUNTER — Encounter: Payer: Self-pay | Admitting: Nurse Practitioner

## 2023-08-01 DIAGNOSIS — F418 Other specified anxiety disorders: Secondary | ICD-10-CM

## 2023-08-01 DIAGNOSIS — E7849 Other hyperlipidemia: Secondary | ICD-10-CM

## 2023-08-01 DIAGNOSIS — R062 Wheezing: Secondary | ICD-10-CM

## 2023-08-01 DIAGNOSIS — M818 Other osteoporosis without current pathological fracture: Secondary | ICD-10-CM | POA: Diagnosis not present

## 2023-08-01 DIAGNOSIS — N1831 Chronic kidney disease, stage 3a: Secondary | ICD-10-CM | POA: Diagnosis not present

## 2023-08-01 DIAGNOSIS — I10 Essential (primary) hypertension: Secondary | ICD-10-CM | POA: Diagnosis not present

## 2023-08-01 DIAGNOSIS — Z8709 Personal history of other diseases of the respiratory system: Secondary | ICD-10-CM | POA: Diagnosis not present

## 2023-08-01 DIAGNOSIS — R634 Abnormal weight loss: Secondary | ICD-10-CM

## 2023-08-01 NOTE — Progress Notes (Signed)
Location:   [facility] Room Number: 915 Place of Service:  ALF (13) Provider: Arna Snipe Ranen Doolin NP  Nitza Schmid X, NP  Patient Care Team: Eliud Polo X, NP as PCP - General (Internal Medicine) Moya, Harrietta Guardian, MD as Referring Physician (Ophthalmology) Donzetta Starch, MD as Consulting Physician (Dermatology) Barnie Alderman, MD as Consulting Physician (Otolaryngology) Raife Lizer X, NP as Nurse Practitioner (Internal Medicine)  Extended Emergency Contact Information Primary Emergency Contact: Baum,Daniel Mobile Phone: [phone] Relation: Son Secondary Emergency Contact: Irving Copas Address: [address]          [place], [place] [address] D[place] of [place] Home Phone: [phone] Mobile Phone: [phone] Relation: Daughter  Code Status:  DNR Goals of care: Advanced Directive information    06/23/2023   11:06 AM  Advanced Directives  Does Patient Have a Medical Advance Directive? Yes  Type of Advance Directive Living will;Healthcare Power of Attorney  Does patient want to make changes to medical advance directive? No - Patient declined  Copy of Healthcare Power of Attorney in Chart? Yes - validated most recent copy scanned in chart (See row information)     Chief Complaint  Patient presents with   Medical Management of Chronic Issues    HPI:  Pt is a 89 y.o. female seen today for medical management of chronic diseases.      No further left CVA region pain, CT abd: 1. Moderate to marked left hydronephrosis to the level of the UPJ without an obstructing stone or mass visualized. Underwent urology evaluation, f/u 6 months. 06/06/23 NM renal imaging, 1. Normal renal function. 2. No obstructive hydronephrosis.             Weight loss, on Mirtazapine 7.5mg  now, gained #2Ibs in the past month, no apparent fluid retention/edema BLE HTN, takes Losartan, prn Clonidine,  ASA             CKD Bun/creat 20/0.79 04/24/23, 22/1.5 05/27/23, 25/1.35 05/29/23              Depression, takes Lexapro and Mirtazapine, TSH 1.5 04/24/23             Hyperlipidemia, takes Simvastatin. LDL 83 04/24/23             OP DEXA T score -4.3, declined Prolia, takes Vit D, Ca,  Vit D level 40 05/17/20,  off Alendronate 2nd to jaw bone necrotic process             Vocal cord paralysis, underwent ENT evaluation in the past.              GERD, takes Omeprazole, Hgb 13.3 05/27/23             Glaucoma R+L follow Ophthalmology             Chronic wheezes at end of deep inspiration, off DuoNeb 2/2 little efficacy. 05/28/23 CXR no acute disease.      Past Medical History:  Diagnosis Date   Anemia    Anxiety    Apnea 05/2000   sleep study revealed mild upper airway obstruction & apnea during speel, no set recommendations for therapy   Asthma    Diverticulosis    Dyspnea 12/15/09   spirometer normal & function actually worsens with nebk may have more of a "exercise-induced" type of bronchospasm    Fatigue 07/2015   GERD (gastroesophageal reflux disease)    Glaucoma    both eyes   Hyperlipidemia    Hypertension  Laryngeal paresis 09/12/2016   Loss of weight 06/20/2016   OSA (obstructive sleep apnea)    Osteoporosis, senile    Vocal cord dysfunction    per ENT, pt NOT to have elective intubation without disscussing with him   Past Surgical History:  Procedure Laterality Date   CATARACT EXTRACTION W/ INTRAOCULAR LENS  IMPLANT, BILATERAL  2011   Dr. Emmit Pomfret   CERVICAL CONIZATION W/BX     due to cervical dysplasia   COLONOSCOPY  02/2002   Diverticublosis    EYE SURGERY Right 02/2016   laser surgery for scar tissure   nasolaryngoscopy  2001   reflux laryngitis   SKIN CANCER EXCISION     ?BCC   TONSILLECTOMY     UPPER GI ENDOSCOPY  05/2000   upper airway edema/changes that were consistent with GERD   vocal fold cordotomy Left 2003   Dr. Viann Shove [facility]    Allergies  Allergen Reactions   Alphagan [Brimonidine] Other (See Comments)   Dorzolamide Hcl Itching    EYE PAIN    Trusopt [Dorzolamide]     Allergies as of 08/01/2023       Reactions   Alphagan [brimonidine] Other (See Comments)   Dorzolamide Hcl Itching   EYE PAIN   Trusopt [dorzolamide]         Medication List        Accurate as of August 01, 2023 10:54 AM. If you have any questions, ask your nurse or doctor.          ascorbic acid 500 MG tablet Commonly known as: VITAMIN C Take 500 mg by mouth daily.   aspirin EC 81 MG tablet Take 81 mg by mouth daily.   brinzolamide 1 % ophthalmic suspension Commonly known as: AZOPT Place 1 drop into both eyes 3 (three) times daily.   Calcium 600+D 600-10 MG-MCG Tabs Generic drug: Calcium Carb-Cholecalciferol Take 1 tablet by mouth daily.   cloNIDine 0.1 MG tablet Commonly known as: CATAPRES Take 0.1 mg by mouth as needed.   cloNIDine 0.1 MG tablet Commonly known as: CATAPRES Take 0.1 mg by mouth as directed. If BP greater than 180/100   escitalopram 10 MG tablet Commonly known as: LEXAPRO TAKE 1 TABLET BY MOUTH EVERY DAY   furosemide 20 MG tablet Commonly known as: LASIX TAKE 1/2 TABLET BY MOUTH DAILY   ketorolac 0.4 % Soln Commonly known as: ACULAR Place 1 drop into the left eye in the morning and at bedtime.   losartan 25 MG tablet Commonly known as: COZAAR TAKE TWO TABLETS (50MG ) BY MOUTH ONCE DAILY FOR BLOOD PRESSURE   loteprednol 0.2 % Susp Commonly known as: LOTEMAX Place 1 drop into the left eye 2 (two) times daily.   MILK OF MAGNESIA PO Take 30 mLs by mouth as needed.   mirtazapine 7.5 MG tablet Commonly known as: REMERON TAKE 1 TABLET (7.5 MG TOTAL) BY MOUTH AT BEDTIME. FOR APPETITE   Netarsudil Dimesylate 0.02 % Soln Apply 1 drop to eye at bedtime. Both eyes   ondansetron 4 MG tablet Commonly known as: ZOFRAN Take 4 mg by mouth every 8 (eight) hours as needed for nausea or vomiting.   polyethylene glycol 17 g packet Commonly known as: MIRALAX / GLYCOLAX Take 17 g by mouth daily.    simvastatin 10 MG tablet Commonly known as: ZOCOR TAKE 1 TABLET (10 MG TOTAL) BY MOUTH DAILY AT 12 NOON. OVERDUE FOR LABS   SYSTANE COMPLETE OP Place 1 drop into both eyes in  the morning, at noon, in the evening, and at bedtime.   Timolol Maleate (Once-Daily) 0.5 % Soln Place 1 drop into both eyes 2 (two) times daily.        Review of Systems  Constitutional:  Negative for fatigue, fever and unexpected weight change.  HENT:  Positive for hearing loss and voice change. Negative for congestion and trouble swallowing.        Vocal cord paralysis.   Eyes:  Positive for visual disturbance.  Respiratory:  Positive for wheezing. Negative for cough.   Cardiovascular:  Negative for leg swelling.  Gastrointestinal:  Negative for abdominal pain and constipation.       Left CVA soreness resolved  Genitourinary:  Negative for dysuria and urgency.  Musculoskeletal:  Positive for arthralgias and gait problem.  Skin:  Negative for color change.  Neurological:  Negative for speech difficulty, weakness and light-headedness.       Memory lapses.   Psychiatric/Behavioral:  Negative for confusion and sleep disturbance. The patient is not nervous/anxious.     Immunization History  Administered Date(s) Administered   DTaP 08/08/2015   Fluad Quad(high Dose 65+) 08/20/2021, 08/28/2022   Influenza Whole 08/06/2018   Influenza, High Dose Seasonal PF 08/13/2017, 08/21/2019, 08/16/2020   Influenza-Unspecified 08/02/2015, 08/15/2016   Moderna Covid-19 Vaccine Bivalent Booster 58yrs & up 09/05/2022   Moderna Sars-Covid-2 Vaccination 11/08/2019, 12/06/2019, 09/12/2020   Pfizer Covid-19 Vaccine Bivalent Booster 49yrs & up 10/18/2021, 02/16/2022   Pneumococcal Conjugate-13 08/02/2015   Pneumococcal Polysaccharide-23 09/05/1999   Tdap 08/08/2015   Pertinent  Health Maintenance Due  Topic Date Due   INFLUENZA VACCINE  06/05/2023   DEXA SCAN  Completed      05/30/2022    1:57 PM 09/12/2022    9:23 AM  10/03/2022    9:06 AM 03/26/2023    2:59 PM 04/17/2023    8:37 AM  Fall Risk  Falls in the past year? 0 0 0 0 0  Was there an injury with Fall? 0 0 0 0 0  Fall Risk Category Calculator 0 0 0 0 0  Fall Risk Category (Retired) Low Low Low    (RETIRED) Patient Fall Risk Level Low fall risk Low fall risk Low fall risk    Patient at Risk for Falls Due to No Fall Risks No Fall Risks No Fall Risks No Fall Risks No Fall Risks  Fall risk Follow up Falls evaluation completed Falls evaluation completed Falls evaluation completed Falls evaluation completed Falls evaluation completed   Functional Status Survey:    Vitals:   08/01/23 1043  BP: 136/62  Pulse: 66  Resp: 18  Temp: 97.6 F (36.4 C)  SpO2: 94%  Weight: 113 lb (51.3 kg)   Body mass index is 20.02 kg/m. Physical Exam Vitals reviewed.  Constitutional:      Appearance: Normal appearance.  HENT:     Head: Normocephalic and atraumatic.     Nose: Nose normal.     Mouth/Throat:     Mouth: Mucous membranes are moist.     Comments: Torus plantinus Eyes:     Extraocular Movements: Extraocular movements intact.     Conjunctiva/sclera: Conjunctivae normal.     Pupils: Pupils are equal, round, and reactive to light.     Comments: Left lower eyelid entropion, Hx of left corneal ulceration, R+L glaucoma, able to read  Cardiovascular:     Rate and Rhythm: Normal rate and regular rhythm.     Heart sounds: No murmur heard. Pulmonary:  Effort: Pulmonary effort is normal.     Breath sounds: Wheezing present. No rales.     Comments: Diffused wheezes at end of deep inspiration.  Abdominal:     General: Bowel sounds are normal.     Palpations: Abdomen is soft.     Tenderness: There is no abdominal tenderness.  Musculoskeletal:     Cervical back: Normal range of motion and neck supple.     Right lower leg: No edema.     Left lower leg: No edema.     Comments:    Skin:    General: Skin is warm and dry.     Findings: Lesion present.      Comments: Scratched skin lesion with dark base under the left eye next to the nasal bridge, no s/s of active bleeding or injection.   Neurological:     General: No focal deficit present.     Mental Status: She is alert. Mental status is at baseline.     Gait: Gait normal.     Comments: Oriented to person and place. Able to walk  Psychiatric:        Mood and Affect: Mood normal.        Behavior: Behavior normal.        Thought Content: Thought content normal.        Judgment: Judgment normal.     Labs reviewed: Recent Labs    04/24/23 0000  NA 142  K 4.2  CL 105  CO2 32*  BUN 20  CREATININE 0.8  CALCIUM 9.2   Recent Labs    04/24/23 0000  AST 18  ALT 8  ALKPHOS 94  ALBUMIN 4.0   Recent Labs    04/24/23 0000  WBC 4.4  NEUTROABS 2,724.00  HGB 13.1  HCT 41  PLT 215   Lab Results  Component Value Date   TSH 2.01 05/14/2022   Lab Results  Component Value Date   HGBA1C 5.6 05/17/2020   Lab Results  Component Value Date   CHOL 163 04/24/2023   HDL 61 04/24/2023   LDLCALC 83 04/24/2023   TRIG 101 04/24/2023   CHOLHDL 2.7 05/14/2022    Significant Diagnostic Results in last 30 days:  No results found.  Assessment/Plan  HTN (hypertension) Intermittent elevated Bp, normal today, asymptomatic,  takes Losartan, prn Clonidine,  ASA  CKD (chronic kidney disease) stage 3, GFR 30-59 ml/min (HCC) Bun/creat 20/0.79 04/24/23, 22/1.5 05/27/23, 25/1.35 05/29/23  Depression with anxiety Her mood is stable, takes Lexapro and Mirtazapine, TSH 1.5 04/24/23  Hyperlipidemia takes Simvastatin. LDL 83 04/24/23  Age-related osteoporosis without fracture  DEXA T score -4.3, declined Prolia, takes Vit D, Ca,  Vit D level 40 05/17/20,  off Alendronate 2nd to jaw bone necrotic process  H/O vocal cord paralysis underwent ENT evaluation in the past.   GERD (gastroesophageal reflux disease) Stable,  takes Omeprazole, Hgb 13.3 05/27/23  Wheezes Chronic wheezes at end of  deep inspiration, off DuoNeb 2/2 little efficacy. 05/28/23 CXR no acute disease.   Weight loss  on Mirtazapine 7.5mg  now, gaied #2Ibs in the past month, no apparent fluid retention/edema BLE   Family/ staff Communication: plan of care reviewed with the patient and charge nurse.   Labs/tests ordered:  none  Time spend 40 minutes.

## 2023-08-01 NOTE — Assessment & Plan Note (Signed)
DEXA T score -4.3, declined Prolia, takes Vit D, Ca,  Vit D level 40 05/17/20,  off Alendronate 2nd to jaw bone necrotic process

## 2023-08-01 NOTE — Assessment & Plan Note (Signed)
Intermittent elevated Bp, normal today, asymptomatic,  takes Losartan, prn Clonidine,  ASA

## 2023-08-01 NOTE — Assessment & Plan Note (Signed)
Bun/creat 20/0.79 04/24/23, 22/1.5 05/27/23, 25/1.35 05/29/23

## 2023-08-01 NOTE — Assessment & Plan Note (Addendum)
Chronic wheezes at end of deep inspiration, off DuoNeb 2/2 little efficacy. 05/28/23 CXR no acute disease.

## 2023-08-01 NOTE — Assessment & Plan Note (Signed)
underwent ENT evaluation in the past.  

## 2023-08-01 NOTE — Assessment & Plan Note (Signed)
takes Simvastatin. LDL 83 04/24/23

## 2023-08-01 NOTE — Assessment & Plan Note (Signed)
Her mood is stable, takes Lexapro and Mirtazapine, TSH 1.5 04/24/23

## 2023-08-01 NOTE — Assessment & Plan Note (Signed)
Stable,  takes Omeprazole, Hgb 13.3 05/27/23

## 2023-08-01 NOTE — Assessment & Plan Note (Signed)
on Mirtazapine 7.5mg  now, gaied #2Ibs in the past month, no apparent fluid retention/edema BLE

## 2023-08-11 DIAGNOSIS — H209 Unspecified iridocyclitis: Secondary | ICD-10-CM | POA: Diagnosis not present

## 2023-08-11 DIAGNOSIS — Z961 Presence of intraocular lens: Secondary | ICD-10-CM | POA: Diagnosis not present

## 2023-08-11 DIAGNOSIS — H401122 Primary open-angle glaucoma, left eye, moderate stage: Secondary | ICD-10-CM | POA: Diagnosis not present

## 2023-08-11 DIAGNOSIS — H401113 Primary open-angle glaucoma, right eye, severe stage: Secondary | ICD-10-CM | POA: Diagnosis not present

## 2023-08-20 DIAGNOSIS — Z23 Encounter for immunization: Secondary | ICD-10-CM | POA: Diagnosis not present

## 2023-08-26 DIAGNOSIS — I10 Essential (primary) hypertension: Secondary | ICD-10-CM | POA: Diagnosis not present

## 2023-08-26 LAB — LAB REPORT - SCANNED: EGFR: 58

## 2023-08-26 LAB — BASIC METABOLIC PANEL
BUN: 13 (ref 4–21)
CO2: 34 — AB (ref 13–22)
Chloride: 102 (ref 99–108)
Creatinine: 0.9 (ref 0.5–1.1)
Glucose: 96
Potassium: 4.4 meq/L (ref 3.5–5.1)
Sodium: 141 (ref 137–147)

## 2023-08-26 LAB — COMPREHENSIVE METABOLIC PANEL
Calcium: 9.3 (ref 8.7–10.7)
eGFR: 58

## 2023-08-27 DIAGNOSIS — H35352 Cystoid macular degeneration, left eye: Secondary | ICD-10-CM | POA: Diagnosis not present

## 2023-08-31 ENCOUNTER — Other Ambulatory Visit: Payer: Self-pay | Admitting: Nurse Practitioner

## 2023-08-31 DIAGNOSIS — E7849 Other hyperlipidemia: Secondary | ICD-10-CM

## 2023-09-01 ENCOUNTER — Encounter: Payer: Self-pay | Admitting: Nurse Practitioner

## 2023-09-01 ENCOUNTER — Non-Acute Institutional Stay: Payer: Medicare Other | Admitting: Nurse Practitioner

## 2023-09-01 DIAGNOSIS — K219 Gastro-esophageal reflux disease without esophagitis: Secondary | ICD-10-CM | POA: Diagnosis not present

## 2023-09-01 DIAGNOSIS — N1831 Chronic kidney disease, stage 3a: Secondary | ICD-10-CM

## 2023-09-01 DIAGNOSIS — G3184 Mild cognitive impairment, so stated: Secondary | ICD-10-CM

## 2023-09-01 DIAGNOSIS — F418 Other specified anxiety disorders: Secondary | ICD-10-CM

## 2023-09-01 DIAGNOSIS — I10 Essential (primary) hypertension: Secondary | ICD-10-CM | POA: Diagnosis not present

## 2023-09-01 DIAGNOSIS — R634 Abnormal weight loss: Secondary | ICD-10-CM | POA: Diagnosis not present

## 2023-09-01 HISTORY — DX: Mild cognitive impairment of uncertain or unknown etiology: G31.84

## 2023-09-01 NOTE — Assessment & Plan Note (Signed)
Blood pressure is controlled,  takes Losartan, prn Clonidine,  ASA

## 2023-09-01 NOTE — Assessment & Plan Note (Signed)
Gradually weight gained, on Mirtazapine 7.5mg , stable.

## 2023-09-01 NOTE — Assessment & Plan Note (Signed)
takes Omeprazole, Hgb 13.3 05/27/23

## 2023-09-01 NOTE — Assessment & Plan Note (Addendum)
staff reported the patient needing step by step curing for ADLs. Otherwise the patient is in her usual state of health.  11/30/20 TSH 1.82 04/22/23 MMSE 30/30 Obtain MMSE The patient denied labs.  Observe.

## 2023-09-01 NOTE — Assessment & Plan Note (Signed)
Bun/creat 13/0.91 08/26/23

## 2023-09-01 NOTE — Progress Notes (Unsigned)
Location:   [facility] Room Number: 915 Place of Service:  ALF (13) Provider: Arna Snipe Joie Hipps NP  Esai Stecklein X, NP  Patient Care Team: Mathea Frieling X, NP as PCP - General (Internal Medicine) Moya, Harrietta Guardian, MD as Referring Physician (Ophthalmology) Donzetta Starch, MD as Consulting Physician (Dermatology) Barnie Alderman, MD as Consulting Physician (Otolaryngology) Nalea Salce X, NP as Nurse Practitioner (Internal Medicine)  Extended Emergency Contact Information Primary Emergency Contact: Baum,Daniel Mobile Phone: [phone] Relation: Son Secondary Emergency Contact: Irving Copas Address: [address]          [place], [place] [address] Darden [place] of [place] Home Phone: [phone] Mobile Phone: [phone] Relation: Daughter  Code Status: DNR Goals of care: Advanced Directive information    06/23/2023   11:06 AM  Advanced Directives  Does Patient Have a Medical Advance Directive? Yes  Type of Advance Directive Living will;Healthcare Power of Attorney  Does patient want to make changes to medical advance directive? No - Patient declined  Copy of Healthcare Power of Attorney in Chart? Yes - validated most recent copy scanned in chart (See row information)     Chief Complaint  Patient presents with   Acute Visit    Needs more cuing for ADLs.     HPI:  Pt is a 89 y.o. female seen today for an acute visit for staff reported the patient needing step by step curing for ADLs. Otherwise the patient is in her usual state of health.    No further left CVA region pain, CT abd: 1. Moderate to marked left hydronephrosis to the level of the UPJ without an obstructing stone or mass visualized. Underwent urology evaluation, f/u 6 months. 06/06/23 NM renal imaging, 1. Normal renal function. 2. No obstructive hydronephrosis.             Weight loss, on Mirtazapine 7.5mg , stable.  HTN, takes Losartan, prn Clonidine,  ASA             CKD Bun/creat 13/0.91 08/26/23              Depression, takes Lexapro and Mirtazapine, TSH 1.5 04/24/23             Hyperlipidemia, takes Simvastatin. LDL 83 04/24/23             OP DEXA T score -4.3, declined Prolia, takes Vit D, Ca,  Vit D level 40 05/17/20,  off Alendronate 2nd to jaw bone necrotic process             Vocal cord paralysis, underwent ENT evaluation in the past.              GERD, takes Omeprazole, Hgb 13.3 05/27/23             Glaucoma R+L follow Ophthalmology             Chronic wheezes at end of deep inspiration, off DuoNeb 2/2 little efficacy. 05/28/23 CXR no acute disease.       Past Medical History:  Diagnosis Date   Anemia    Anxiety    Apnea 05/2000   sleep study revealed mild upper airway obstruction & apnea during speel, no set recommendations for therapy   Asthma    Diverticulosis    Dyspnea 12/15/09   spirometer normal & function actually worsens with nebk may have more of a "exercise-induced" type of bronchospasm    Fatigue 07/2015   GERD (gastroesophageal reflux disease)    Glaucoma    both eyes  Hyperlipidemia    Hypertension    Laryngeal paresis 09/12/2016   Loss of weight 06/20/2016   OSA (obstructive sleep apnea)    Osteoporosis, senile    Vocal cord dysfunction    per ENT, pt NOT to have elective intubation without disscussing with him   Past Surgical History:  Procedure Laterality Date   CATARACT EXTRACTION W/ INTRAOCULAR LENS  IMPLANT, BILATERAL  2011   Dr. Emmit Pomfret   CERVICAL CONIZATION W/BX     due to cervical dysplasia   COLONOSCOPY  02/2002   Diverticublosis    EYE SURGERY Right 02/2016   laser surgery for scar tissure   nasolaryngoscopy  2001   reflux laryngitis   SKIN CANCER EXCISION     ?BCC   TONSILLECTOMY     UPPER GI ENDOSCOPY  05/2000   upper airway edema/changes that were consistent with GERD   vocal fold cordotomy Left 2003   Dr. Viann Shove [facility]    Allergies  Allergen Reactions   Alphagan [Brimonidine] Other (See Comments)   Dorzolamide Hcl Itching    EYE  PAIN   Trusopt [Dorzolamide]     Allergies as of 09/01/2023       Reactions   Alphagan [brimonidine] Other (See Comments)   Dorzolamide Hcl Itching   EYE PAIN   Trusopt [dorzolamide]         Medication List        Accurate as of September 01, 2023 11:59 PM. If you have any questions, ask your nurse or doctor.          ascorbic acid 500 MG tablet Commonly known as: VITAMIN C Take 500 mg by mouth daily.   aspirin EC 81 MG tablet Take 81 mg by mouth daily.   brinzolamide 1 % ophthalmic suspension Commonly known as: AZOPT Place 1 drop into both eyes 3 (three) times daily.   Calcium 600+D 600-10 MG-MCG Tabs Generic drug: Calcium Carb-Cholecalciferol Take 1 tablet by mouth daily.   cloNIDine 0.1 MG tablet Commonly known as: CATAPRES Take 0.1 mg by mouth as needed.   cloNIDine 0.1 MG tablet Commonly known as: CATAPRES Take 0.1 mg by mouth as directed. If BP greater than 180/100   escitalopram 10 MG tablet Commonly known as: LEXAPRO TAKE 1 TABLET BY MOUTH EVERY DAY   furosemide 20 MG tablet Commonly known as: LASIX TAKE 1/2 TABLET BY MOUTH DAILY   ketorolac 0.4 % Soln Commonly known as: ACULAR Place 1 drop into the left eye in the morning and at bedtime.   losartan 25 MG tablet Commonly known as: COZAAR TAKE TWO TABLETS (50MG ) BY MOUTH ONCE DAILY FOR BLOOD PRESSURE   loteprednol 0.2 % Susp Commonly known as: LOTEMAX Place 1 drop into the left eye 2 (two) times daily.   MILK OF MAGNESIA PO Take 30 mLs by mouth as needed.   mirtazapine 7.5 MG tablet Commonly known as: REMERON TAKE 1 TABLET (7.5 MG TOTAL) BY MOUTH AT BEDTIME. FOR APPETITE   Netarsudil Dimesylate 0.02 % Soln Apply 1 drop to eye at bedtime. Both eyes   ondansetron 4 MG tablet Commonly known as: ZOFRAN Take 4 mg by mouth every 8 (eight) hours as needed for nausea or vomiting.   polyethylene glycol 17 g packet Commonly known as: MIRALAX / GLYCOLAX Take 17 g by mouth daily.    simvastatin 10 MG tablet Commonly known as: ZOCOR TAKE 1 TABLET (10 MG TOTAL) BY MOUTH DAILY AT 12 NOON. OVERDUE FOR LABS   SYSTANE COMPLETE  OP Place 1 drop into both eyes in the morning, at noon, in the evening, and at bedtime.   Timolol Maleate (Once-Daily) 0.5 % Soln Place 1 drop into both eyes 2 (two) times daily.        Review of Systems  Constitutional:  Negative for fatigue, fever and unexpected weight change.  HENT:  Positive for hearing loss and voice change. Negative for congestion and trouble swallowing.        Vocal cord paralysis.   Eyes:  Positive for visual disturbance.  Respiratory:  Positive for wheezing. Negative for cough.   Cardiovascular:  Negative for leg swelling.  Gastrointestinal:  Negative for abdominal pain and constipation.       Left CVA soreness resolved  Genitourinary:  Negative for dysuria and urgency.  Musculoskeletal:  Positive for arthralgias and gait problem.  Skin:  Negative for color change.  Neurological:  Negative for speech difficulty, weakness and light-headedness.       Memory lapses.   Psychiatric/Behavioral:  Negative for confusion and sleep disturbance. The patient is not nervous/anxious.     Immunization History  Administered Date(s) Administered   DTaP 08/08/2015   Fluad Quad(high Dose 65+) 08/20/2021, 08/28/2022   Influenza Whole 08/06/2018   Influenza, High Dose Seasonal PF 08/13/2017, 08/21/2019, 08/16/2020   Influenza-Unspecified 08/02/2015, 08/15/2016   Moderna Covid-19 Vaccine Bivalent Booster 32yrs & up 09/05/2022   Moderna Sars-Covid-2 Vaccination 11/08/2019, 12/06/2019, 09/12/2020   Pfizer Covid-19 Vaccine Bivalent Booster 76yrs & up 10/18/2021, 02/16/2022   Pneumococcal Conjugate-13 08/02/2015   Pneumococcal Polysaccharide-23 09/05/1999   Tdap 08/08/2015   Pertinent  Health Maintenance Due  Topic Date Due   INFLUENZA VACCINE  06/05/2023   DEXA SCAN  Completed      05/30/2022    1:57 PM 09/12/2022    9:23 AM  10/03/2022    9:06 AM 03/26/2023    2:59 PM 04/17/2023    8:37 AM  Fall Risk  Falls in the past year? 0 0 0 0 0  Was there an injury with Fall? 0 0 0 0 0  Fall Risk Category Calculator 0 0 0 0 0  Fall Risk Category (Retired) Low Low Low    (RETIRED) Patient Fall Risk Level Low fall risk Low fall risk Low fall risk    Patient at Risk for Falls Due to No Fall Risks No Fall Risks No Fall Risks No Fall Risks No Fall Risks  Fall risk Follow up Falls evaluation completed Falls evaluation completed Falls evaluation completed Falls evaluation completed Falls evaluation completed   Functional Status Survey:    Vitals:   09/01/23 1346  BP: (!) 140/72  Pulse: 70  Resp: 18  Temp: 98.4 F (36.9 C)  SpO2: 94%  Weight: 113 lb 12.8 oz (51.6 kg)   Body mass index is 20.16 kg/m. Physical Exam Vitals reviewed.  Constitutional:      Appearance: Normal appearance.  HENT:     Head: Normocephalic and atraumatic.     Nose: Nose normal.     Mouth/Throat:     Mouth: Mucous membranes are moist.     Comments: Torus plantinus Eyes:     Extraocular Movements: Extraocular movements intact.     Conjunctiva/sclera: Conjunctivae normal.     Pupils: Pupils are equal, round, and reactive to light.     Comments: Left lower eyelid entropion, Hx of left corneal ulceration, R+L glaucoma, able to read  Cardiovascular:     Rate and Rhythm: Normal rate and regular rhythm.  Heart sounds: No murmur heard. Pulmonary:     Effort: Pulmonary effort is normal.     Breath sounds: Wheezing present. No rales.     Comments: Diffused wheezes at end of deep inspiration.  Abdominal:     General: Bowel sounds are normal.     Palpations: Abdomen is soft.     Tenderness: There is no abdominal tenderness.  Musculoskeletal:     Cervical back: Normal range of motion and neck supple.     Right lower leg: No edema.     Left lower leg: No edema.     Comments:    Skin:    General: Skin is warm and dry.     Findings:  Lesion present.     Comments: Scratched skin lesion with dark base under the left eye next to the nasal bridge, no s/s of active bleeding or injection.   Neurological:     General: No focal deficit present.     Mental Status: She is alert. Mental status is at baseline.     Gait: Gait normal.     Comments: Oriented to person and place. Able to walk  Psychiatric:        Mood and Affect: Mood normal.        Behavior: Behavior normal.        Thought Content: Thought content normal.        Judgment: Judgment normal.     Labs reviewed: Recent Labs    04/24/23 0000  NA 142  K 4.2  CL 105  CO2 32*  BUN 20  CREATININE 0.8  CALCIUM 9.2   Recent Labs    04/24/23 0000  AST 18  ALT 8  ALKPHOS 94  ALBUMIN 4.0   Recent Labs    04/24/23 0000  WBC 4.4  NEUTROABS 2,724.00  HGB 13.1  HCT 41  PLT 215   Lab Results  Component Value Date   TSH 2.01 05/14/2022   Lab Results  Component Value Date   HGBA1C 5.6 05/17/2020   Lab Results  Component Value Date   CHOL 163 04/24/2023   HDL 61 04/24/2023   LDLCALC 83 04/24/2023   TRIG 101 04/24/2023   CHOLHDL 2.7 05/14/2022    Significant Diagnostic Results in last 30 days:  No results found.  Assessment/Plan: Mild cognitive impairment  staff reported the patient needing step by step curing for ADLs. Otherwise the patient is in her usual state of health.  11/30/20 TSH 1.82 04/22/23 MMSE 30/30 Obtain MMSE 28/30 09/01/23 The patient denied labs.  Observe.   Weight loss Gradually weight gained, on Mirtazapine 7.5mg , stable.   HTN (hypertension) Blood pressure is controlled,  takes Losartan, prn Clonidine,  ASA  CKD (chronic kidney disease) stage 3, GFR 30-59 ml/min (HCC) Bun/creat 13/0.91 08/26/23  Depression with anxiety Her mood is stable, takes Lexapro and Mirtazapine, TSH 1.5 04/24/23  GERD (gastroesophageal reflux disease)  takes Omeprazole, Hgb 13.3 05/27/23    Family/ staff Communication: plan of care  reviewed with the patient and charge nurse.   Labs/tests ordered:  none  Time spend 40 minutes.

## 2023-09-01 NOTE — Assessment & Plan Note (Signed)
Her mood is stable, takes Lexapro and Mirtazapine, TSH 1.5 04/24/23

## 2023-09-02 NOTE — Telephone Encounter (Signed)
Message routed to PCP Mast, Man X, NP

## 2023-10-01 NOTE — Telephone Encounter (Signed)
Manxie, the medical assistant are not allowed to remove medications from the patients chart and there appears to be some confusion about what this patient is and is not taking. I would suggest a visit with her to address mediations and update her medication list.

## 2023-10-06 NOTE — Telephone Encounter (Signed)
Message forwarded to [facility] to Update Medication list.

## 2023-10-14 DIAGNOSIS — H524 Presbyopia: Secondary | ICD-10-CM | POA: Diagnosis not present

## 2023-10-14 DIAGNOSIS — H43813 Vitreous degeneration, bilateral: Secondary | ICD-10-CM | POA: Diagnosis not present

## 2023-10-14 DIAGNOSIS — H5211 Myopia, right eye: Secondary | ICD-10-CM | POA: Diagnosis not present

## 2023-10-14 DIAGNOSIS — H52223 Regular astigmatism, bilateral: Secondary | ICD-10-CM | POA: Diagnosis not present

## 2023-10-14 DIAGNOSIS — H401134 Primary open-angle glaucoma, bilateral, indeterminate stage: Secondary | ICD-10-CM | POA: Diagnosis not present

## 2023-10-15 DIAGNOSIS — H35352 Cystoid macular degeneration, left eye: Secondary | ICD-10-CM | POA: Diagnosis not present

## 2023-10-20 ENCOUNTER — Encounter: Payer: Self-pay | Admitting: Sports Medicine

## 2023-10-20 ENCOUNTER — Non-Acute Institutional Stay (SKILLED_NURSING_FACILITY): Payer: Medicare Other | Admitting: Sports Medicine

## 2023-10-20 DIAGNOSIS — I10 Essential (primary) hypertension: Secondary | ICD-10-CM

## 2023-10-20 DIAGNOSIS — M818 Other osteoporosis without current pathological fracture: Secondary | ICD-10-CM | POA: Diagnosis not present

## 2023-10-20 DIAGNOSIS — F418 Other specified anxiety disorders: Secondary | ICD-10-CM | POA: Diagnosis not present

## 2023-10-20 DIAGNOSIS — E782 Mixed hyperlipidemia: Secondary | ICD-10-CM | POA: Diagnosis not present

## 2023-10-20 DIAGNOSIS — H401130 Primary open-angle glaucoma, bilateral, stage unspecified: Secondary | ICD-10-CM

## 2023-10-20 NOTE — Progress Notes (Signed)
Location:  [facility] Room Number: 915-A Place of Service:  ALF [phone]) Provider:  Laurelin Elson,Prashanti,MD  Mast, Man X, NP  Patient Care Team: Mast, Man X, NP as PCP - General (Internal Medicine) Moya, Harrietta Guardian, MD as Referring Physician (Ophthalmology) Donzetta Starch, MD as Consulting Physician (Dermatology) Barnie Alderman, MD as Consulting Physician (Otolaryngology) Mast, Man X, NP as Nurse Practitioner (Internal Medicine)  Extended Emergency Contact Information Primary Emergency Contact: Bayne,Daniel Mobile Phone: [phone] Relation: Son Secondary Emergency Contact: Irving Copas Address: [address]          [place], [place] [address] [place] of [place] Home Phone: [phone] Mobile Phone: [phone] Relation: Daughter  Code Status:  FULL Goals of care: Advanced Directive information    10/20/2023   10:08 AM  Advanced Directives  Does Patient Have a Medical Advance Directive? Yes  Type of Estate agent of [place];Living will  Does patient want to make changes to medical advance directive? No - Patient declined  Copy of Healthcare Power of Attorney in Chart? Yes - validated most recent copy scanned in chart (See row information)     Chief Complaint  Patient presents with   Medical Management of Chronic Issues    Routine visit and discuss Medicare Annual Wellness Visit.    HPI:  Pt is a 89 y.o. female seen today for medical management of chronic diseases.   Pt seen in her room, sitting comfortable in her recliner chair As per staff pt is requiring more cueing with her ADLS She is able to ambulate with a walker Can transfer herself and feed her self Pt has no concerns  today   Glaucoma - follows with [facility]     On losartan  Pt denies being dizzy or lightheaded    Latest Ref Rng & Units 08/26/2023   12:00 AM 04/24/2023   12:00 AM 05/14/2022    7:00 AM  BMP  Glucose 65 - 99 mg/dL   79    BUN 4 - 21 13     20     20    Creatinine 0.5 - 1.1 0.9     0.8     0.99   BUN/Creat Ratio 6 - 22 (calc)   20   Sodium 137 - 147 141     142     144   Potassium 3.5 - 5.1 mEq/L 4.4     4.2     4.1   Chloride 99 - 108 102     105     103   CO2 13 - 22 34     32     35   Calcium 8.7 - 10.7 9.3     9.2     9.2      This result is from an external source.     Depression/ Anxiety  On lexapro  As per staff pt worries about her family   H/O Vocal cord paralysis Followed with ENT in the past  No chocking as per staff  Weight stable since last few months   H/O Osteoporosis - as per chart review declined prolia, off fosamax due to jaw necrosis  On calcium, vit D supplements   HLD - on simvastatin   Past Medical History:  Diagnosis Date   Anemia    Anxiety    Apnea 05/2000   sleep study revealed mild upper airway obstruction & apnea during speel, no set recommendations for therapy   Asthma  Diverticulosis    Dyspnea 12/15/09   spirometer normal & function actually worsens with nebk may have more of a "exercise-induced" type of bronchospasm    Fatigue 07/2015   GERD (gastroesophageal reflux disease)    Glaucoma    both eyes   Hyperlipidemia    Hypertension    Laryngeal paresis 09/12/2016   Loss of weight 06/20/2016   OSA (obstructive sleep apnea)    Osteoporosis, senile    Vocal cord dysfunction    per ENT, pt NOT to have elective intubation without disscussing with him   Past Surgical History:  Procedure Laterality Date   CATARACT EXTRACTION W/ INTRAOCULAR LENS  IMPLANT, BILATERAL  2011   Dr. Emmit Pomfret   CERVICAL CONIZATION W/BX     due to cervical dysplasia   COLONOSCOPY  02/2002   Diverticublosis    EYE SURGERY Right 02/2016   laser surgery for scar tissure   nasolaryngoscopy  2001   reflux laryngitis   SKIN CANCER EXCISION     ?BCC   TONSILLECTOMY     UPPER GI ENDOSCOPY  05/2000   upper airway edema/changes that were consistent with GERD   vocal fold cordotomy Left  2003   Dr. Viann Shove [facility]    Allergies  Allergen Reactions   Alphagan [Brimonidine] Other (See Comments)   Dorzolamide Hcl Itching    EYE PAIN   Trusopt [Dorzolamide]     Outpatient Encounter Medications as of 10/20/2023  Medication Sig   ascorbic acid (VITAMIN C) 500 MG tablet Take 500 mg by mouth daily.   aspirin EC 81 MG tablet Take 81 mg by mouth daily.   brinzolamide (AZOPT) 1 % ophthalmic suspension Place 1 drop into both eyes 3 (three) times daily.    Calcium Carb-Cholecalciferol (CALCIUM 600+D) 600-10 MG-MCG TABS Take 1 tablet by mouth daily.   cloNIDine (CATAPRES) 0.1 MG tablet Take 0.1 mg by mouth as needed.   cloNIDine (CATAPRES) 0.1 MG tablet Take 0.1 mg by mouth as directed. If BP greater than 180/100   escitalopram (LEXAPRO) 10 MG tablet TAKE 1 TABLET BY MOUTH EVERY DAY   ketorolac (ACULAR) 0.4 % SOLN Place 1 drop into the left eye 3 (three) times daily.   losartan (COZAAR) 25 MG tablet TAKE TWO TABLETS (50MG ) BY MOUTH ONCE DAILY FOR BLOOD PRESSURE   loteprednol (LOTEMAX) 0.2 % SUSP Place 1 drop into the left eye 2 (two) times daily.   Magnesium Hydroxide (MILK OF MAGNESIA PO) Take 30 mLs by mouth as needed.   mirtazapine (REMERON) 7.5 MG tablet TAKE 1 TABLET (7.5 MG TOTAL) BY MOUTH AT BEDTIME. FOR APPETITE   Netarsudil Dimesylate 0.02 % SOLN Apply 1 drop to eye at bedtime. Both eyes   ondansetron (ZOFRAN) 4 MG tablet Take 4 mg by mouth every 8 (eight) hours as needed for nausea or vomiting.   polyethylene glycol (MIRALAX / GLYCOLAX) 17 g packet Take 17 g by mouth daily.   Propylene Glycol (SYSTANE COMPLETE OP) Place 1 drop into both eyes in the morning, at noon, in the evening, and at bedtime.   simvastatin (ZOCOR) 10 MG tablet TAKE 1 TABLET (10 MG TOTAL) BY MOUTH DAILY AT 12 NOON. OVERDUE FOR LABS   Timolol Maleate 0.5 % (DAILY) SOLN Place 1 drop into both eyes 2 (two) times daily.    furosemide (LASIX) 20 MG tablet TAKE 1/2 TABLET BY MOUTH DAILY (Patient not  taking: Reported on 10/20/2023)   No facility-administered encounter medications on file as of 10/20/2023.  Review of Systems  Constitutional:  Negative for fever.  Respiratory:  Negative for cough, shortness of breath and wheezing.   Cardiovascular:  Negative for chest pain, palpitations and leg swelling.  Gastrointestinal:  Negative for abdominal distention, abdominal pain, blood in stool, constipation, diarrhea, nausea and vomiting.  Genitourinary:  Negative for dysuria and hematuria.  Neurological:  Negative for dizziness.    Immunization History  Administered Date(s) Administered   DTaP 08/08/2015   Fluad Quad(high Dose 65+) 08/20/2021, 08/28/2022   Influenza Whole 08/06/2018   Influenza, High Dose Seasonal PF 08/13/2017, 08/21/2019, 08/16/2020, 08/06/2023   Influenza-Unspecified 08/02/2015, 08/15/2016   Moderna Covid-19 Vaccine Bivalent Booster 71yrs & up 09/05/2022, 08/20/2023   Moderna Sars-Covid-2 Vaccination 11/08/2019, 12/06/2019, 09/12/2020   Pfizer Covid-19 Vaccine Bivalent Booster 71yrs & up 10/18/2021, 02/16/2022   Pneumococcal Conjugate-13 08/02/2015   Pneumococcal Polysaccharide-23 09/05/1999   Tdap 08/08/2015   Pertinent  Health Maintenance Due  Topic Date Due   INFLUENZA VACCINE  Completed   DEXA SCAN  Completed      05/30/2022    1:57 PM 09/12/2022    9:23 AM 10/03/2022    9:06 AM 03/26/2023    2:59 PM 04/17/2023    8:37 AM  Fall Risk  Falls in the past year? 0 0 0 0 0  Was there an injury with Fall? 0 0 0 0 0  Fall Risk Category Calculator 0 0 0 0 0  Fall Risk Category (Retired) Low Low Low    (RETIRED) Patient Fall Risk Level Low fall risk Low fall risk Low fall risk    Patient at Risk for Falls Due to No Fall Risks No Fall Risks No Fall Risks No Fall Risks No Fall Risks  Fall risk Follow up Falls evaluation completed Falls evaluation completed Falls evaluation completed Falls evaluation completed Falls evaluation completed   Functional Status  Survey:    Vitals:   10/20/23 1005  BP: 132/62  Pulse: 66  Resp: 18  Temp: (!) 97.5 F (36.4 C)  SpO2: 92%  Weight: 112 lb 6.4 oz (51 kg)  Height: 5\' 3"  (1.6 m)   Body mass index is 19.91 kg/m. Physical Exam Constitutional:      Appearance: Normal appearance.  Cardiovascular:     Rate and Rhythm: Normal rate and regular rhythm.  Pulmonary:     Effort: Pulmonary effort is normal. No respiratory distress.     Breath sounds: Normal breath sounds. No wheezing.  Abdominal:     General: Bowel sounds are normal. There is no distension.     Tenderness: There is no abdominal tenderness. There is no guarding or rebound.     Comments:    Musculoskeletal:        General: No swelling or tenderness.  Neurological:     Mental Status: She is alert. Mental status is at baseline.     Sensory: No sensory deficit.     Motor: No weakness.     Labs reviewed: Recent Labs    04/24/23 0000 08/26/23 0000  NA 142 141  K 4.2 4.4  CL 105 102  CO2 32* 34*  BUN 20 13  CREATININE 0.8 0.9  CALCIUM 9.2 9.3   Recent Labs    04/24/23 0000  AST 18  ALT 8  ALKPHOS 94  ALBUMIN 4.0   Recent Labs    04/24/23 0000  WBC 4.4  NEUTROABS 2,724.00  HGB 13.1  HCT 41  PLT 215   Lab Results  Component Value Date  TSH 2.01 05/14/2022   Lab Results  Component Value Date   HGBA1C 5.6 05/17/2020   Lab Results  Component Value Date   CHOL 163 04/24/2023   HDL 61 04/24/2023   LDLCALC 83 04/24/2023   TRIG 101 04/24/2023   CHOLHDL 2.7 05/14/2022    Significant Diagnostic Results in last 30 days:  No results found.  Assessment/Plan  1. Primary hypertension (Primary) At goal  Cont with the same     Latest Ref Rng & Units 08/26/2023   12:00 AM 04/24/2023   12:00 AM 05/14/2022    7:00 AM  BMP  Glucose 65 - 99 mg/dL   79   BUN 4 - 21 13     20     20    Creatinine 0.5 - 1.1 0.9     0.8     0.99   BUN/Creat Ratio 6 - 22 (calc)   20   Sodium 137 - 147 141     142     144    Potassium 3.5 - 5.1 mEq/L 4.4     4.2     4.1   Chloride 99 - 108 102     105     103   CO2 13 - 22 34     32     35   Calcium 8.7 - 10.7 9.3     9.2     9.2      This result is from an external source.     2. Mixed hyperlipidemia Cont with simvastatin  3. Age-related osteoporosis without fracture Cont with calcium, vit d supplements  4. Depression with anxiety Stable as per staff  Cont with lexapro  5. Primary open angle glaucoma of both eyes, unspecified glaucoma stage As per [facility]   Other orders - Basic metabolic panel - Comprehensive metabolic panel    Family/ staff Communication: care plan discussed with the nursing staff    30 min Total time spent for obtaining history,  performing a medically appropriate examination and evaluation, reviewing the tests, documenting clinical information in the electronic or other health record, independently interpreting results ,care coordination (not separately reported)

## 2023-11-07 DIAGNOSIS — H35352 Cystoid macular degeneration, left eye: Secondary | ICD-10-CM | POA: Diagnosis not present

## 2023-11-07 DIAGNOSIS — H35372 Puckering of macula, left eye: Secondary | ICD-10-CM | POA: Diagnosis not present

## 2023-11-10 ENCOUNTER — Non-Acute Institutional Stay: Payer: Self-pay | Admitting: Nurse Practitioner

## 2023-11-10 ENCOUNTER — Encounter: Payer: Self-pay | Admitting: Nurse Practitioner

## 2023-11-10 DIAGNOSIS — J3802 Paralysis of vocal cords and larynx, bilateral: Secondary | ICD-10-CM

## 2023-11-10 DIAGNOSIS — N1831 Chronic kidney disease, stage 3a: Secondary | ICD-10-CM | POA: Diagnosis not present

## 2023-11-10 DIAGNOSIS — K219 Gastro-esophageal reflux disease without esophagitis: Secondary | ICD-10-CM

## 2023-11-10 DIAGNOSIS — E7849 Other hyperlipidemia: Secondary | ICD-10-CM

## 2023-11-10 DIAGNOSIS — F418 Other specified anxiety disorders: Secondary | ICD-10-CM | POA: Diagnosis not present

## 2023-11-10 DIAGNOSIS — R062 Wheezing: Secondary | ICD-10-CM | POA: Diagnosis not present

## 2023-11-10 DIAGNOSIS — M818 Other osteoporosis without current pathological fracture: Secondary | ICD-10-CM

## 2023-11-10 DIAGNOSIS — I10 Essential (primary) hypertension: Secondary | ICD-10-CM

## 2023-11-10 MED ORDER — ROCKLATAN 0.02-0.005 % OP SOLN: 1.0000 [drp] | Freq: Every evening | OPHTHALMIC | Status: AC

## 2023-11-10 NOTE — Assessment & Plan Note (Signed)
underwent ENT evaluation in the past.  

## 2023-11-10 NOTE — Assessment & Plan Note (Signed)
Chronic wheezes at end of deep inspiration, off DuoNeb 2/2 little efficacy. 05/28/23 CXR no acute disease.

## 2023-11-10 NOTE — Assessment & Plan Note (Signed)
 takes Simvastatin. LDL 83 04/24/23

## 2023-11-10 NOTE — Assessment & Plan Note (Signed)
 Occasionally elevated Sbp in 150s average, asymptomatic, loose Bp control, continue Losartan, prn Clonidine.

## 2023-11-10 NOTE — Assessment & Plan Note (Signed)
 Stable, takes Lexapro and Mirtazapine, TSH 1.5 04/24/23

## 2023-11-10 NOTE — Progress Notes (Signed)
 Location:   [facility] Room Number: 915 Place of Service:  ALF (13) Provider: Larwance Haytham Maher NP  Flint Hakeem X, NP  Patient Care Team: Cailyn Houdek X, NP as PCP - General (Internal Medicine) Moya, Dempsey Pac, MD as Referring Physician (Ophthalmology) Joshua Blamer, MD as Consulting Physician (Dermatology) Brien Garnette Pepper, MD as Consulting Physician (Otolaryngology) Dayami Taitt X, NP as Nurse Practitioner (Internal Medicine)  Extended Emergency Contact Information Primary Emergency Contact: Wherley,Daniel Mobile Phone: [phone] Relation: Son Secondary Emergency Contact: Dwain Pool Address: [address]          [place], [place] [address] [place] Home Phone: [phone] Mobile Phone: [phone] Relation: Daughter  Code Status: DNR Goals of care: Advanced Directive information    10/20/2023   10:08 AM  Advanced Directives  Does Patient Have a Medical Advance Directive? Yes  Type of Estate Agent of [place];Living will  Does patient want to make changes to medical advance directive? No - Patient declined  Copy of Healthcare Power of Attorney in Chart? Yes - validated most recent copy scanned in chart (See row information)     Chief Complaint  Patient presents with   Acute Visit    Managing blood pressure.     HPI:  Pt is a 89 y.o. female seen today for an acute visit for intermittent elevated Sbp 140, 146, 150, 152, 169, 152, single episodes 210 11/06/23, responded to prn Clonidine , otherwise blood pressure is controlled, lowest reading 109/60. The patient is asymptomatic.     No further left CVA region pain, CT abd: 1. Moderate to marked left hydronephrosis to the level of the UPJ without an obstructing stone or mass visualized. Underwent urology evaluation, f/u 6 months. 06/06/23 NM renal imaging, 1. Normal renal function. 2. No obstructive hydronephrosis.             Weight loss, on Mirtazapine  7.5mg , stable.  HTN,  takes Losartan , prn Clonidine ,  ASA             CKD Bun/creat 13/0.91 08/26/23             Depression, takes Lexapro  and Mirtazapine , TSH 1.5 04/24/23             Hyperlipidemia, takes Simvastatin . LDL 83 04/24/23             OP DEXA T score -4.3, declined Prolia, takes Vit D, Ca,  Vit D level 40 05/17/20,  off Alendronate  2nd to jaw bone necrotic process             Vocal cord paralysis, underwent ENT evaluation in the past.              GERD, takes Omeprazole, Hgb 13.3 05/27/23             Glaucoma R+L follow Ophthalmology             Chronic wheezes at end of deep inspiration, off DuoNeb 2/2 little efficacy. 05/28/23 CXR no acute disease.         Past Medical History:  Diagnosis Date   Anemia    Anxiety    Apnea 05/2000   sleep study revealed mild upper airway obstruction & apnea during speel, no set recommendations for therapy   Asthma    Diverticulosis    Dyspnea 12/15/09   spirometer normal & function actually worsens with nebk may have more of a exercise-induced type of bronchospasm    Fatigue 07/2015   GERD (gastroesophageal reflux disease)  Glaucoma    both eyes   Hyperlipidemia    Hypertension    Laryngeal paresis 09/12/2016   Loss of weight 06/20/2016   OSA (obstructive sleep apnea)    Osteoporosis, senile    Vocal cord dysfunction    per ENT, pt NOT to have elective intubation without disscussing with him   Past Surgical History:  Procedure Laterality Date   CATARACT EXTRACTION W/ INTRAOCULAR LENS  IMPLANT, BILATERAL  2011   Dr. Carrie   CERVICAL CONIZATION W/BX     due to cervical dysplasia   COLONOSCOPY  02/2002   Diverticublosis    EYE SURGERY Right 02/2016   laser surgery for scar tissure   nasolaryngoscopy  2001   reflux laryngitis   SKIN CANCER EXCISION     ?BCC   TONSILLECTOMY     UPPER GI ENDOSCOPY  05/2000   upper airway edema/changes that were consistent with GERD   vocal fold cordotomy Left 2003   Dr. Franchot Silvan [facility]    Allergies  Allergen  Reactions   Alphagan [Brimonidine] Other (See Comments)   Dorzolamide Hcl Itching    EYE PAIN   Trusopt [Dorzolamide]     Allergies as of 11/10/2023       Reactions   Alphagan [brimonidine] Other (See Comments)   Dorzolamide Hcl Itching   EYE PAIN   Trusopt [dorzolamide]         Medication List        Accurate as of November 10, 2023 11:59 PM. If you have any questions, ask your nurse or doctor.          STOP taking these medications    Netarsudil Dimesylate 0.02 % Soln Stopped by: Ashni Lonzo X Tacara Hadlock       TAKE these medications    ascorbic acid 500 MG tablet Commonly known as: VITAMIN C Take 500 mg by mouth daily.   aspirin EC 81 MG tablet Take 81 mg by mouth daily.   brinzolamide 1 % ophthalmic suspension Commonly known as: AZOPT Place 1 drop into both eyes 3 (three) times daily.   Calcium 600+D 600-10 MG-MCG Tabs Generic drug: Calcium Carb-Cholecalciferol Take 1 tablet by mouth daily.   cloNIDine  0.1 MG tablet Commonly known as: CATAPRES  Take 0.1 mg by mouth as needed.   cloNIDine  0.1 MG tablet Commonly known as: CATAPRES  Take 0.1 mg by mouth as directed. If BP greater than 180/100   escitalopram  10 MG tablet Commonly known as: LEXAPRO  TAKE 1 TABLET BY MOUTH EVERY DAY   furosemide  20 MG tablet Commonly known as: LASIX  TAKE 1/2 TABLET BY MOUTH DAILY   ketorolac 0.4 % Soln Commonly known as: ACULAR Place 1 drop into the left eye 3 (three) times daily.   losartan  25 MG tablet Commonly known as: COZAAR  TAKE TWO TABLETS (50MG ) BY MOUTH ONCE DAILY FOR BLOOD PRESSURE   loteprednol 0.2 % Susp Commonly known as: LOTEMAX Place 1 drop into the left eye 2 (two) times daily.   MILK OF MAGNESIA PO Take 30 mLs by mouth as needed.   mirtazapine  7.5 MG tablet Commonly known as: REMERON  TAKE 1 TABLET (7.5 MG TOTAL) BY MOUTH AT BEDTIME. FOR APPETITE   ondansetron 4 MG tablet Commonly known as: ZOFRAN Take 4 mg by mouth every 8 (eight) hours as needed for  nausea or vomiting.   polyethylene glycol 17 g packet Commonly known as: MIRALAX / GLYCOLAX Take 17 g by mouth daily.   Rocklatan  0.02-0.005 % Soln Generic drug: Netarsudil-Latanoprost Apply  1 drop to eye every evening. Started by: Evey Mcmahan X Jeanni Allshouse   simvastatin  10 MG tablet Commonly known as: ZOCOR  TAKE 1 TABLET (10 MG TOTAL) BY MOUTH DAILY AT 12 NOON. OVERDUE FOR LABS   SYSTANE COMPLETE OP Place 1 drop into both eyes in the morning, at noon, in the evening, and at bedtime.   Timolol Maleate (Once-Daily) 0.5 % Soln Place 1 drop into both eyes 2 (two) times daily.        Review of Systems  Constitutional:  Negative for fatigue, fever and unexpected weight change.  HENT:  Positive for hearing loss and voice change. Negative for congestion and trouble swallowing.        Vocal cord paralysis.   Eyes:  Positive for visual disturbance.  Respiratory:  Positive for wheezing. Negative for cough.   Cardiovascular:  Negative for leg swelling.  Gastrointestinal:  Negative for abdominal pain and constipation.       Left CVA soreness resolved  Genitourinary:  Negative for dysuria and urgency.  Musculoskeletal:  Positive for arthralgias and gait problem.  Skin:  Negative for color change.  Neurological:  Negative for speech difficulty, weakness and light-headedness.       Memory lapses.   Psychiatric/Behavioral:  Negative for confusion and sleep disturbance. The patient is not nervous/anxious.     Immunization History  Administered Date(s) Administered   DTaP 08/08/2015   Fluad Quad(high Dose 65+) 08/20/2021, 08/28/2022   Influenza Whole 08/06/2018   Influenza, High Dose Seasonal PF 08/13/2017, 08/21/2019, 08/16/2020, 08/06/2023   Influenza-Unspecified 08/02/2015, 08/15/2016   Moderna Covid-19 Vaccine  Bivalent Booster 68yrs & up 09/05/2022, 08/20/2023   Moderna Sars-Covid-2 Vaccination 11/08/2019, 12/06/2019, 09/12/2020   Pfizer Covid-19 Vaccine Bivalent Booster 22yrs & up 10/18/2021,  02/16/2022   Pneumococcal Conjugate-13 08/02/2015   Pneumococcal Polysaccharide-23 09/05/1999   Tdap 08/08/2015   Pertinent  Health Maintenance Due  Topic Date Due   INFLUENZA VACCINE  Completed   DEXA SCAN  Completed      05/30/2022    1:57 PM 09/12/2022    9:23 AM 10/03/2022    9:06 AM 03/26/2023    2:59 PM 04/17/2023    8:37 AM  Fall Risk  Falls in the past year? 0 0 0 0 0  Was there an injury with Fall? 0 0 0 0 0  Fall Risk Category Calculator 0 0 0 0 0  Fall Risk Category (Retired) Low Low Low    (RETIRED) Patient Fall Risk Level Low fall risk Low fall risk Low fall risk    Patient at Risk for Falls Due to No Fall Risks No Fall Risks No Fall Risks No Fall Risks No Fall Risks  Fall risk Follow up Falls evaluation completed Falls evaluation completed Falls evaluation completed Falls evaluation completed Falls evaluation completed   Functional Status Survey:    Vitals:   11/10/23 1308 11/11/23 1129  BP: (!) 150/80 132/68  Pulse: 67   Resp: 18   Temp: 98.2 F (36.8 C)   SpO2: 92%   Weight: 112 lb 9.6 oz (51.1 kg)    Body mass index is 19.95 kg/m. Physical Exam Vitals reviewed.  Constitutional:      Appearance: Normal appearance.  HENT:     Head: Normocephalic and atraumatic.     Nose: Nose normal.     Mouth/Throat:     Mouth: Mucous membranes are moist.     Comments: Torus plantinus Eyes:     Extraocular Movements: Extraocular movements intact.  Conjunctiva/sclera: Conjunctivae normal.     Pupils: Pupils are equal, round, and reactive to light.     Comments: Left lower eyelid entropion, Hx of left corneal ulceration, R+L glaucoma, able to read  Cardiovascular:     Rate and Rhythm: Normal rate and regular rhythm.     Heart sounds: No murmur heard. Pulmonary:     Effort: Pulmonary effort is normal.     Breath sounds: Wheezing present. No rales.     Comments: Diffused wheezes at end of deep inspiration.  Abdominal:     General: Bowel sounds are normal.      Palpations: Abdomen is soft.     Tenderness: There is no abdominal tenderness.  Musculoskeletal:     Cervical back: Normal range of motion and neck supple.     Right lower leg: No edema.     Left lower leg: No edema.     Comments:    Skin:    General: Skin is warm and dry.     Findings: Lesion present.     Comments: Scratched skin lesion with dark base under the left eye next to the nasal bridge, no s/s of active bleeding or injection.   Neurological:     General: No focal deficit present.     Mental Status: She is alert. Mental status is at baseline.     Gait: Gait normal.     Comments: Oriented to person and place. Able to walk  Psychiatric:        Mood and Affect: Mood normal.        Behavior: Behavior normal.        Thought Content: Thought content normal.        Judgment: Judgment normal.     Labs reviewed: Recent Labs    04/24/23 0000 08/26/23 0000  NA 142 141  K 4.2 4.4  CL 105 102  CO2 32* 34*  BUN 20 13  CREATININE 0.8 0.9  CALCIUM 9.2 9.3   Recent Labs    04/24/23 0000  AST 18  ALT 8  ALKPHOS 94  ALBUMIN 4.0   Recent Labs    04/24/23 0000  WBC 4.4  NEUTROABS 2,724.00  HGB 13.1  HCT 41  PLT 215   Lab Results  Component Value Date   TSH 2.01 05/14/2022   Lab Results  Component Value Date   HGBA1C 5.6 05/17/2020   Lab Results  Component Value Date   CHOL 163 04/24/2023   HDL 61 04/24/2023   LDLCALC 83 04/24/2023   TRIG 101 04/24/2023   CHOLHDL 2.7 05/14/2022    Significant Diagnostic Results in last 30 days:  No results found.  Assessment/Plan: HTN (hypertension) Occasionally elevated Sbp in 150s average, asymptomatic, loose Bp control, continue Losartan , prn Clonidine .   CKD (chronic kidney disease) stage 3, GFR 30-59 ml/min (HCC) Bun/creat 13/0.91 08/26/23  Depression with anxiety Stable,  takes Lexapro  and Mirtazapine , TSH 1.5 04/24/23  Hyperlipidemia  takes Simvastatin . LDL 83 04/24/23  Age-related osteoporosis without  fracture DEXA T score -4.3, declined Prolia, takes Vit D, Ca,  Vit D level 40 05/17/20,  off Alendronate  2nd to jaw bone necrotic process  Vocal fold paralysis, bilateral  underwent ENT evaluation in the past.   GERD (gastroesophageal reflux disease)  takes Omeprazole, Hgb 13.3 05/27/23  Wheezes Chronic wheezes at end of deep inspiration, off DuoNeb 2/2 little efficacy. 05/28/23 CXR no acute disease.       Family/ staff Communication: plan of care reviewed with  the patient, the patient's daughter, and charge nurse.   Labs/tests ordered:  none  Time spend 40 minutes.

## 2023-11-10 NOTE — Assessment & Plan Note (Signed)
takes Omeprazole, Hgb 13.3 05/27/23

## 2023-11-10 NOTE — Assessment & Plan Note (Signed)
DEXA T score -4.3, declined Prolia, takes Vit D, Ca,  Vit D level 40 05/17/20,  off Alendronate 2nd to jaw bone necrotic process

## 2023-11-10 NOTE — Assessment & Plan Note (Signed)
 Bun/creat 13/0.91 08/26/23

## 2023-12-03 DIAGNOSIS — J3802 Paralysis of vocal cords and larynx, bilateral: Secondary | ICD-10-CM | POA: Diagnosis not present

## 2023-12-22 ENCOUNTER — Encounter: Payer: Self-pay | Admitting: Sports Medicine

## 2023-12-22 ENCOUNTER — Non-Acute Institutional Stay (SKILLED_NURSING_FACILITY): Payer: Self-pay | Admitting: Sports Medicine

## 2023-12-22 DIAGNOSIS — F411 Generalized anxiety disorder: Secondary | ICD-10-CM | POA: Diagnosis not present

## 2023-12-22 DIAGNOSIS — I10 Essential (primary) hypertension: Secondary | ICD-10-CM

## 2023-12-22 NOTE — Progress Notes (Signed)
Provider:  Dr. Venita Sheffield Location:  [facility] Place of Service:   Assisted living   PCP: Mast, Man X, NP Patient Care Team: Mast, Man X, NP as PCP - General (Internal Medicine) Moya, Harrietta Guardian, MD as Referring Physician (Ophthalmology) Donzetta Starch, MD as Consulting Physician (Dermatology) Barnie Alderman, MD as Consulting Physician (Otolaryngology) Mast, Man X, NP as Nurse Practitioner (Internal Medicine)  Extended Emergency Contact Information Primary Emergency Contact: Nieland,Daniel Mobile Phone: [phone] Relation: Son Secondary Emergency Contact: Irving Copas Address: [address]          [place], [place] [address] Darden [place] of [place] Home Phone: [phone] Mobile Phone: [phone] Relation: Daughter  Goals of Care: Advanced Directive information    10/20/2023   10:08 AM  Advanced Directives  Does Patient Have a Medical Advance Directive? Yes  Type of Estate agent of [place];Living will  Does patient want to make changes to medical advance directive? No - Patient declined  Copy of Healthcare Power of Attorney in Chart? Yes - validated most recent copy scanned in chart (See row information)        History of Present Illness        89 yr old F with h/o  HTN, depression, anxiety, vocal cord paralysis, GERD, glaucoma  is evaluated for acute visit for high blood pressure.  Pt seen and examined in her room  Daughter at bed side Pt sitting in her recliner chair, does not appear to be in distress Pt denies headache, nausea, vomiting, chest pain, sob, dizziness, abdominal pain, hematuria. Pt walks to dining room to eat her lunch and dinner She ate her breakfast with no problems  Her blood pressure been running high intermittently  Pt is currently on losartan 25 mg bid Pt receives clonidine prn for high bp readings    H/O GAD  As per daughter pt at times gets agitated   Past Medical History:   Diagnosis Date   Anemia    Anxiety    Apnea 05/2000   sleep study revealed mild upper airway obstruction & apnea during speel, no set recommendations for therapy   Asthma    Diverticulosis    Dyspnea 12/15/09   spirometer normal & function actually worsens with nebk may have more of a "exercise-induced" type of bronchospasm    Fatigue 07/2015   GERD (gastroesophageal reflux disease)    Glaucoma    both eyes   Hyperlipidemia    Hypertension    Laryngeal paresis 09/12/2016   Loss of weight 06/20/2016   OSA (obstructive sleep apnea)    Osteoporosis, senile    Vocal cord dysfunction    per ENT, pt NOT to have elective intubation without disscussing with him   Past Surgical History:  Procedure Laterality Date   CATARACT EXTRACTION W/ INTRAOCULAR LENS  IMPLANT, BILATERAL  2011   Dr. Emmit Pomfret   CERVICAL CONIZATION W/BX     due to cervical dysplasia   COLONOSCOPY  02/2002   Diverticublosis    EYE SURGERY Right 02/2016   laser surgery for scar tissure   nasolaryngoscopy  2001   reflux laryngitis   SKIN CANCER EXCISION     ?[facility]   TONSILLECTOMY     UPPER GI ENDOSCOPY  05/2000   upper airway edema/changes that were consistent with GERD   vocal fold cordotomy Left 2003   Dr. Viann Shove [facility]    reports that she has never smoked. Her smokeless tobacco use includes chew and snuff. She reports  that she does not drink alcohol and does not use drugs. Social History   Socioeconomic History   Marital status: Widowed    Spouse name: Not on file   Number of children: 3   Years of education: Not on file   Highest education level: Not on file  Occupational History   Occupation: Retired  Tobacco Use   Smoking status: Never   Smokeless tobacco: Current    Types: Chew, Snuff  Vaping Use   Vaping status: Never Used  Substance and Sexual Activity   Alcohol use: No   Drug use: No   Sexual activity: Never  Other Topics Concern   Not on file  Social History Narrative   Lives at [facility]   Widowed   Previously smoked stopped 1986   Alcohol none   Caffeine occasionally   Exercise walk   Social Drivers of Health   Financial Resource Strain: Medium Risk (10/03/2017)   Overall Financial Resource Strain (CARDIA)    Difficulty of Paying Living Expenses: Somewhat hard  Food Insecurity: No Food Insecurity (10/03/2017)   Hunger Vital Sign    Worried About Running Out of Food in the Last Year: Never true    Ran Out of Food in the Last Year: Never true  Transportation Needs: No Transportation Needs (10/03/2017)   PRAPARE - Administrator, Civil Service (Medical): No    Lack of Transportation (Non-Medical): No  Physical Activity: Insufficiently Active (10/03/2017)   Exercise Vital Sign    Days of Exercise per Week: 3 days    Minutes of Exercise per Session: 30 min  Stress: Stress Concern Present (10/05/2018)   [employer] of Occupational Health - Occupational Stress Questionnaire    Feeling of Stress : Rather much  Social Connections: Somewhat Isolated (10/03/2017)   Social Connection and Isolation Panel [NHANES]    Frequency of Communication with Friends and Family: More than three times a week    Frequency of Social Gatherings with Friends and Family: More than three times a week    Attends Religious Services: 1 to 4 times per year    Active Member of [employer] or Organizations: No    Attends Banker Meetings: Never    Marital Status: Widowed  Intimate Partner Violence: Not At Risk (10/03/2017)   Humiliation, Afraid, Rape, and Kick questionnaire    Fear of Current or Ex-Partner: No    Emotionally Abused: No    Physically Abused: No    Sexually Abused: No    Functional Status Survey:    Family History  Problem Relation Age of Onset   Melanoma Mother    Transient ischemic attack Father    Multiple sclerosis Sister    Cancer Brother    Colon cancer Neg Hx     Health Maintenance  Topic Date Due   Medicare Annual  Wellness (AWV)  10/04/2023   COVID-19 Vaccine (8 - 2024-25 season) 10/15/2023   Zoster Vaccines- Shingrix (1 of 2) 11/04/2094 (Originally 08/03/1979)   DTaP/Tdap/Td (3 - Td or Tdap) 08/07/2025   Pneumonia Vaccine 50+ Years old  Completed   INFLUENZA VACCINE  Completed   DEXA SCAN  Completed   HPV VACCINES  Aged Out    Allergies  Allergen Reactions   Alphagan [Brimonidine] Other (See Comments)   Dorzolamide Hcl Itching    EYE PAIN   Trusopt [Dorzolamide]     Outpatient Encounter Medications as of 12/22/2023  Medication Sig   ascorbic acid (VITAMIN  C) 500 MG tablet Take 500 mg by mouth daily.   aspirin EC 81 MG tablet Take 81 mg by mouth daily.   brinzolamide (AZOPT) 1 % ophthalmic suspension Place 1 drop into both eyes 3 (three) times daily.    Calcium Carb-Cholecalciferol (CALCIUM 600+D) 600-10 MG-MCG TABS Take 1 tablet by mouth daily.   cloNIDine (CATAPRES) 0.1 MG tablet Take 0.1 mg by mouth as needed.   cloNIDine (CATAPRES) 0.1 MG tablet Take 0.1 mg by mouth as directed. If BP greater than 180/100   escitalopram (LEXAPRO) 10 MG tablet TAKE 1 TABLET BY MOUTH EVERY DAY   furosemide (LASIX) 20 MG tablet TAKE 1/2 TABLET BY MOUTH DAILY (Patient not taking: Reported on 10/20/2023)   ketorolac (ACULAR) 0.4 % SOLN Place 1 drop into the left eye 3 (three) times daily.   losartan (COZAAR) 25 MG tablet TAKE TWO TABLETS (50MG ) BY MOUTH ONCE DAILY FOR BLOOD PRESSURE   loteprednol (LOTEMAX) 0.2 % SUSP Place 1 drop into the left eye 2 (two) times daily.   Magnesium Hydroxide (MILK OF MAGNESIA PO) Take 30 mLs by mouth as needed.   mirtazapine (REMERON) 7.5 MG tablet TAKE 1 TABLET (7.5 MG TOTAL) BY MOUTH AT BEDTIME. FOR APPETITE   ondansetron (ZOFRAN) 4 MG tablet Take 4 mg by mouth every 8 (eight) hours as needed for nausea or vomiting.   polyethylene glycol (MIRALAX / GLYCOLAX) 17 g packet Take 17 g by mouth daily.   Propylene Glycol (SYSTANE COMPLETE OP) Place 1 drop into both eyes in the  morning, at noon, in the evening, and at bedtime.   simvastatin (ZOCOR) 10 MG tablet TAKE 1 TABLET (10 MG TOTAL) BY MOUTH DAILY AT 12 NOON. OVERDUE FOR LABS   Timolol Maleate 0.5 % (DAILY) SOLN Place 1 drop into both eyes 2 (two) times daily.    No facility-administered encounter medications on file as of 12/22/2023.    Review of Systems  Constitutional:  Negative for fever.  Respiratory:  Negative for cough, shortness of breath and wheezing.   Cardiovascular:  Negative for chest pain, palpitations and leg swelling.  Gastrointestinal:  Negative for abdominal distention, abdominal pain, blood in stool, constipation, diarrhea, nausea and vomiting.  Genitourinary:  Negative for dysuria, frequency and urgency.  Neurological:  Negative for dizziness, weakness and numbness.   Negative unless indicated in HPI.  There were no vitals filed for this visit. There is no height or weight on file to calculate BMI. BP Readings from Last 3 Encounters:  11/11/23 132/68  10/20/23 132/62  09/01/23 (!) 140/72   Wt Readings from Last 3 Encounters:  11/10/23 112 lb 9.6 oz (51.1 kg)  10/20/23 112 lb 6.4 oz (51 kg)  09/01/23 113 lb 12.8 oz (51.6 kg)   Physical Exam Constitutional:      Appearance: Normal appearance.  HENT:     Head: Normocephalic and atraumatic.  Cardiovascular:     Rate and Rhythm: Normal rate and regular rhythm.  Pulmonary:     Effort: Pulmonary effort is normal. No respiratory distress.     Breath sounds: Normal breath sounds. No wheezing.  Abdominal:     General: Bowel sounds are normal. There is no distension.     Tenderness: There is no abdominal tenderness. There is no guarding or rebound.     Comments:    Musculoskeletal:        General: No swelling or tenderness.  Neurological:     Mental Status: She is alert. Mental status is at  baseline.     Motor: No weakness.     Comments: Rombergs negative Finger nose negative eomi     Labs reviewed: Basic Metabolic  Panel: Recent Labs    04/24/23 0000 08/26/23 0000  NA 142 141  K 4.2 4.4  CL 105 102  CO2 32* 34*  BUN 20 13  CREATININE 0.8 0.9  CALCIUM 9.2 9.3   Liver Function Tests: Recent Labs    04/24/23 0000  AST 18  ALT 8  ALKPHOS 94  ALBUMIN 4.0   No results for input(s): "LIPASE", "AMYLASE" in the last 8760 hours. No results for input(s): "AMMONIA" in the last 8760 hours. CBC: Recent Labs    04/24/23 0000  WBC 4.4  NEUTROABS 2,724.00  HGB 13.1  HCT 41  PLT 215   Cardiac Enzymes: No results for input(s): "CKTOTAL", "CKMB", "CKMBINDEX", "TROPONINI" in the last 8760 hours. BNP: Invalid input(s): "POCBNP" Lab Results  Component Value Date   HGBA1C 5.6 05/17/2020   Lab Results  Component Value Date   TSH 2.01 05/14/2022   Lab Results  Component Value Date   VITAMINB12 307 04/24/2023   No results found for: "FOLATE" No results found for: "IRON", "TIBC", "FERRITIN"  Imaging and Procedures obtained prior to SNF admission: US Abdomen Complete Result Date: 06/15/2023 CLINICAL DATA:  Hydronephrosis. EXAM: ABDOMEN ULTRASOUND COMPLETE COMPARISON:  CT 05/27/2023 FINDINGS: Gallbladder: No gallstones or wall thickening visualized. No sonographic Murphy sign noted by sonographer. Common bile duct: Diameter: 2.1 mm. Liver: No focal lesion identified. Within normal limits in parenchymal echogenicity. Portal vein is patent on color Doppler imaging with normal direction of blood flow towards the liver. IVC: No abnormality visualized. Pancreas: Visualized portion unremarkable. Spleen: Size and appearance within normal limits. Right Kidney: Length: 11.0 cm. Echogenicity within normal limits. No mass or hydronephrosis visualized. 3 cm cyst with single thin septation. Left Kidney: Length: 10.2 cm. Echogenicity within normal limits. Minimal hydronephrosis. Abdominal aorta: No aneurysm visualized. Mild atherosclerotic disease. Other findings: No free fluid. IMPRESSION: 1. No acute findings. 2.  3 cm right renal cyst. 3. Minimal known left hydronephrosis. Electronically Signed   By: Elberta Fortis M.D.   On: 06/15/2023 12:31    Assessment and Plan      1. Primary hypertension (Primary) Will increase losartan to 50 mg bid Monitor bp daily  Avoid salty foods Cont with clonidine 0.1 mg prn for high blood pressure readings  Instructed staff to monitor bp daily around the same time  2. GAD (generalized anxiety disorder) Depression  Will increase lexapro to 20 mg daily  Will monitor      40 min  Total time spent for obtaining history,  performing a medically appropriate examination and evaluation, reviewing the tests,  documenting clinical information in the electronic or other health record ,care coordination (not separately reported)  min

## 2023-12-31 ENCOUNTER — Non-Acute Institutional Stay: Payer: Self-pay | Admitting: Nurse Practitioner

## 2023-12-31 ENCOUNTER — Encounter: Payer: Self-pay | Admitting: Nurse Practitioner

## 2023-12-31 DIAGNOSIS — F418 Other specified anxiety disorders: Secondary | ICD-10-CM | POA: Diagnosis not present

## 2023-12-31 DIAGNOSIS — R634 Abnormal weight loss: Secondary | ICD-10-CM | POA: Diagnosis not present

## 2023-12-31 DIAGNOSIS — E7849 Other hyperlipidemia: Secondary | ICD-10-CM | POA: Diagnosis not present

## 2023-12-31 DIAGNOSIS — I10 Essential (primary) hypertension: Secondary | ICD-10-CM

## 2023-12-31 DIAGNOSIS — N1831 Chronic kidney disease, stage 3a: Secondary | ICD-10-CM | POA: Diagnosis not present

## 2023-12-31 DIAGNOSIS — U071 COVID-19: Secondary | ICD-10-CM | POA: Diagnosis not present

## 2023-12-31 DIAGNOSIS — K219 Gastro-esophageal reflux disease without esophagitis: Secondary | ICD-10-CM | POA: Diagnosis not present

## 2023-12-31 DIAGNOSIS — R062 Wheezing: Secondary | ICD-10-CM

## 2023-12-31 NOTE — Assessment & Plan Note (Signed)
 Blood pressure is controlled,  takes Losartan, prn Clonidine,  ASA

## 2023-12-31 NOTE — Assessment & Plan Note (Signed)
 Vocal cord paralysis, underwent ENT evaluation in the past.   Tested positive COVID 12/31/23 in setting of c/o "hot" last night and raspy voice, denied sore throat, aches or SOB.   HPOA/patient prefers monitoring and delay Paxlovid

## 2023-12-31 NOTE — Assessment & Plan Note (Signed)
 Stable,  takes Omeprazole, Hgb 13.3 05/27/23

## 2023-12-31 NOTE — Assessment & Plan Note (Signed)
 chronic

## 2023-12-31 NOTE — Progress Notes (Signed)
 Location:   [facility] Room Number: 915 Place of Service:  ALF (13) Provider: Arna Snipe Layani Foronda NP  Haitham Dolinsky X, NP  Patient Care Team: Braxen Dobek X, NP as PCP - General (Internal Medicine) Moya, Harrietta Guardian, MD as Referring Physician (Ophthalmology) Donzetta Starch, MD as Consulting Physician (Dermatology) Barnie Alderman, MD as Consulting Physician (Otolaryngology) Cyrena Kuchenbecker X, NP as Nurse Practitioner (Internal Medicine)  Extended Emergency Contact Information Primary Emergency Contact: Heckart,Daniel Mobile Phone: [phone] Relation: Son Secondary Emergency Contact: Irving Copas Address: [address]          [place], [place] [address] [place] of [place] Home Phone: [phone] Mobile Phone: [phone] Relation: Daughter  Code Status:  DNR Goals of care: Advanced Directive information    10/20/2023   10:08 AM  Advanced Directives  Does Patient Have a Medical Advance Directive? Yes  Type of Estate agent of [place];Living will  Does patient want to make changes to medical advance directive? No - Patient declined  Copy of Healthcare Power of Attorney in Chart? Yes - validated most recent copy scanned in chart (See row information)     Chief Complaint  Patient presents with   Medical Management of Chronic Issues    HPI:  Pt is a 89 y.o. female seen today for medical management of chronic diseases.    No further left CVA region pain, CT abd: 1. Moderate to marked left hydronephrosis to the level of the UPJ without an obstructing stone or mass visualized. Underwent urology evaluation, f/u 6 months. 06/06/23 NM renal imaging, 1. Normal renal function. 2. No obstructive hydronephrosis.             Weight loss, stable, on Mirtazapine 7.5mg  HTN, takes Losartan, prn Clonidine,  ASA             CKD Bun/creat 13/0.91 08/26/23             Depression, takes Lexapro and Mirtazapine, TSH 1.5 04/24/23             Hyperlipidemia, takes  Simvastatin. LDL 83 04/24/23             OP DEXA T score -4.3, declined Prolia, takes Vit D, Ca,  Vit D level 40 05/17/20,  off Alendronate 2nd to jaw bone necrotic process             Vocal cord paralysis, underwent ENT evaluation in the past.   Tested positive COVID 12/31/23 in setting of c/o "hot" last night and raspy voice, denied sore throat, aches or SOB.              GERD, takes Omeprazole, Hgb 13.3 05/27/23             Glaucoma R+L follow Ophthalmology             Chronic wheezes at end of deep inspiration, off DuoNeb 2/2 little efficacy. 05/28/23 CXR no acute disease.        Past Medical History:  Diagnosis Date   Anemia    Anxiety    Apnea 05/2000   sleep study revealed mild upper airway obstruction & apnea during speel, no set recommendations for therapy   Asthma    Diverticulosis    Dyspnea 12/15/09   spirometer normal & function actually worsens with nebk may have more of a "exercise-induced" type of bronchospasm    Fatigue 07/2015   GERD (gastroesophageal reflux disease)    Glaucoma    both eyes   Hyperlipidemia  Hypertension    Laryngeal paresis 09/12/2016   Loss of weight 06/20/2016   OSA (obstructive sleep apnea)    Osteoporosis, senile    Vocal cord dysfunction    per ENT, pt NOT to have elective intubation without disscussing with him   Past Surgical History:  Procedure Laterality Date   CATARACT EXTRACTION W/ INTRAOCULAR LENS  IMPLANT, BILATERAL  2011   Dr. Emmit Pomfret   CERVICAL CONIZATION W/BX     due to cervical dysplasia   COLONOSCOPY  02/2002   Diverticublosis    EYE SURGERY Right 02/2016   laser surgery for scar tissure   nasolaryngoscopy  2001   reflux laryngitis   SKIN CANCER EXCISION     ?BCC   TONSILLECTOMY     UPPER GI ENDOSCOPY  05/2000   upper airway edema/changes that were consistent with GERD   vocal fold cordotomy Left 2003   Dr. Viann Shove [facility]    Allergies  Allergen Reactions   Alphagan [Brimonidine] Other (See Comments)    Dorzolamide Hcl Itching    EYE PAIN   Trusopt [Dorzolamide]     Allergies as of 12/31/2023       Reactions   Alphagan [brimonidine] Other (See Comments)   Dorzolamide Hcl Itching   EYE PAIN   Trusopt [dorzolamide]         Medication List        Accurate as of December 31, 2023 11:59 PM. If you have any questions, ask your nurse or doctor.          ascorbic acid 500 MG tablet Commonly known as: VITAMIN C Take 500 mg by mouth daily.   aspirin EC 81 MG tablet Take 81 mg by mouth daily.   brinzolamide 1 % ophthalmic suspension Commonly known as: AZOPT Place 1 drop into both eyes 3 (three) times daily.   Calcium 600+D 600-10 MG-MCG Tabs Generic drug: Calcium Carb-Cholecalciferol Take 1 tablet by mouth daily.   cloNIDine 0.1 MG tablet Commonly known as: CATAPRES Take 0.1 mg by mouth as needed.   cloNIDine 0.1 MG tablet Commonly known as: CATAPRES Take 0.1 mg by mouth as directed. If BP greater than 180/100   escitalopram 10 MG tablet Commonly known as: LEXAPRO TAKE 1 TABLET BY MOUTH EVERY DAY   ketorolac 0.4 % Soln Commonly known as: ACULAR Place 1 drop into the left eye 3 (three) times daily.   losartan 25 MG tablet Commonly known as: COZAAR TAKE TWO TABLETS (50MG ) BY MOUTH ONCE DAILY FOR BLOOD PRESSURE   loteprednol 0.2 % Susp Commonly known as: LOTEMAX Place 1 drop into the left eye 2 (two) times daily.   MILK OF MAGNESIA PO Take 30 mLs by mouth as needed.   mirtazapine 7.5 MG tablet Commonly known as: REMERON TAKE 1 TABLET (7.5 MG TOTAL) BY MOUTH AT BEDTIME. FOR APPETITE   ondansetron 4 MG tablet Commonly known as: ZOFRAN Take 4 mg by mouth every 8 (eight) hours as needed for nausea or vomiting.   polyethylene glycol 17 g packet Commonly known as: MIRALAX / GLYCOLAX Take 17 g by mouth daily.   simvastatin 10 MG tablet Commonly known as: ZOCOR TAKE 1 TABLET (10 MG TOTAL) BY MOUTH DAILY AT 12 NOON. OVERDUE FOR LABS   SYSTANE COMPLETE  OP Place 1 drop into both eyes in the morning, at noon, in the evening, and at bedtime.   Timolol Maleate (Once-Daily) 0.5 % Soln Place 1 drop into both eyes 2 (two) times daily.  Review of Systems  Constitutional:  Negative for fatigue, fever and unexpected weight change.  HENT:  Positive for congestion, hearing loss and voice change. Negative for trouble swallowing.        Vocal cord paralysis.  Raspy voice  Eyes:  Positive for visual disturbance.  Respiratory:  Positive for wheezing. Negative for cough.   Cardiovascular:  Negative for leg swelling.  Gastrointestinal:  Negative for abdominal pain and constipation.       Left CVA soreness resolved  Genitourinary:  Negative for dysuria and urgency.  Musculoskeletal:  Positive for arthralgias and gait problem.  Skin:  Negative for color change.  Neurological:  Negative for speech difficulty, weakness and light-headedness.       Memory lapses.   Psychiatric/Behavioral:  Negative for confusion and sleep disturbance. The patient is not nervous/anxious.     Immunization History  Administered Date(s) Administered   DTaP 08/08/2015   Fluad Quad(high Dose 65+) 08/20/2021, 08/28/2022   Influenza Whole 08/06/2018   Influenza, High Dose Seasonal PF 08/13/2017, 08/21/2019, 08/16/2020, 08/06/2023   Influenza-Unspecified 08/02/2015, 08/15/2016   Moderna Covid-19 Vaccine Bivalent Booster 26yrs & up 09/05/2022, 08/20/2023   Moderna Sars-Covid-2 Vaccination 11/08/2019, 12/06/2019, 09/12/2020   Pfizer Covid-19 Vaccine Bivalent Booster 81yrs & up 10/18/2021, 02/16/2022   Pneumococcal Conjugate-13 08/02/2015   Pneumococcal Polysaccharide-23 09/05/1999   Tdap 08/08/2015   Pertinent  Health Maintenance Due  Topic Date Due   INFLUENZA VACCINE  Completed   DEXA SCAN  Completed      05/30/2022    1:57 PM 09/12/2022    9:23 AM 10/03/2022    9:06 AM 03/26/2023    2:59 PM 04/17/2023    8:37 AM  Fall Risk  Falls in the past year? 0 0 0 0  0  Was there an injury with Fall? 0 0 0 0 0  Fall Risk Category Calculator 0 0 0 0 0  Fall Risk Category (Retired) Low Low Low    (RETIRED) Patient Fall Risk Level Low fall risk Low fall risk Low fall risk    Patient at Risk for Falls Due to No Fall Risks No Fall Risks No Fall Risks No Fall Risks No Fall Risks  Fall risk Follow up Falls evaluation completed Falls evaluation completed Falls evaluation completed Falls evaluation completed Falls evaluation completed   Functional Status Survey:    Vitals:   12/31/23 1523  BP: (!) 140/75  Pulse: 68  Resp: 18  Temp: (!) 97.4 F (36.3 C)  SpO2: 94%  Weight: 114 lb 3.2 oz (51.8 kg)   Body mass index is 20.23 kg/m. Physical Exam Vitals reviewed.  Constitutional:      Appearance: Normal appearance.  HENT:     Head: Normocephalic and atraumatic.     Nose: Congestion and rhinorrhea present.     Mouth/Throat:     Mouth: Mucous membranes are moist.     Pharynx: No oropharyngeal exudate or posterior oropharyngeal erythema.     Comments: Torus plantinus Eyes:     Extraocular Movements: Extraocular movements intact.     Conjunctiva/sclera: Conjunctivae normal.     Pupils: Pupils are equal, round, and reactive to light.     Comments: Left lower eyelid entropion, Hx of left corneal ulceration, R+L glaucoma, able to read  Cardiovascular:     Rate and Rhythm: Normal rate and regular rhythm.     Heart sounds: No murmur heard. Pulmonary:     Effort: Pulmonary effort is normal.     Breath sounds: Wheezing  present. No rales.     Comments: Diffused wheezes is chronic Abdominal:     General: Bowel sounds are normal.     Palpations: Abdomen is soft.     Tenderness: There is no abdominal tenderness.  Musculoskeletal:     Cervical back: Normal range of motion and neck supple.     Right lower leg: No edema.     Left lower leg: No edema.     Comments:    Skin:    General: Skin is warm and dry.     Findings: Lesion present.     Comments:  Scratched skin lesion with dark base under the left eye next to the nasal bridge, no s/s of active bleeding or injection.   Neurological:     General: No focal deficit present.     Mental Status: She is alert. Mental status is at baseline.     Gait: Gait normal.     Comments: Oriented to person and place. Able to walk  Psychiatric:        Mood and Affect: Mood normal.        Behavior: Behavior normal.        Thought Content: Thought content normal.        Judgment: Judgment normal.     Labs reviewed: Recent Labs    04/24/23 0000 08/26/23 0000  NA 142 141  K 4.2 4.4  CL 105 102  CO2 32* 34*  BUN 20 13  CREATININE 0.8 0.9  CALCIUM 9.2 9.3   Recent Labs    04/24/23 0000  AST 18  ALT 8  ALKPHOS 94  ALBUMIN 4.0   Recent Labs    04/24/23 0000  WBC 4.4  NEUTROABS 2,724.00  HGB 13.1  HCT 41  PLT 215   Lab Results  Component Value Date   TSH 2.01 05/14/2022   Lab Results  Component Value Date   HGBA1C 5.6 05/17/2020   Lab Results  Component Value Date   CHOL 163 04/24/2023   HDL 61 04/24/2023   LDLCALC 83 04/24/2023   TRIG 101 04/24/2023   CHOLHDL 2.7 05/14/2022    Significant Diagnostic Results in last 30 days:  No results found.  Assessment/Plan  COVID-19 virus RNA test result positive at limit of detection Vocal cord paralysis, underwent ENT evaluation in the past.   Tested positive COVID 12/31/23 in setting of c/o "hot" last night and raspy voice, denied sore throat, aches or SOB.   HPOA/patient prefers monitoring and delay Paxlovid  Weight loss  stable, on Mirtazapine 7.5mg   HTN (hypertension) Blood pressure is controlled, takes Losartan, prn Clonidine,  ASA  CKD (chronic kidney disease) stage 3, GFR 30-59 ml/min (HCC) Bun/creat 13/0.91 08/26/23  Depression with anxiety Stable,  takes Lexapro and Mirtazapine, TSH 1.5 04/24/23  Hyperlipidemia  takes Simvastatin. LDL 83 04/24/23  GERD (gastroesophageal reflux disease) Stable, takes  Omeprazole, Hgb 13.3 05/27/23  Wheezes chronic   Family/ staff Communication: plan of care reviewed with the patient, the patient's daughter, and charge nurse.   Labs/tests ordered:  none

## 2023-12-31 NOTE — Assessment & Plan Note (Signed)
 stable, on Mirtazapine 7.5mg 

## 2023-12-31 NOTE — Assessment & Plan Note (Signed)
 takes Simvastatin. LDL 83 04/24/23

## 2023-12-31 NOTE — Assessment & Plan Note (Signed)
 Stable, takes Lexapro and Mirtazapine, TSH 1.5 04/24/23

## 2023-12-31 NOTE — Assessment & Plan Note (Signed)
 Bun/creat 13/0.91 08/26/23

## 2024-01-14 DIAGNOSIS — H35352 Cystoid macular degeneration, left eye: Secondary | ICD-10-CM | POA: Diagnosis not present

## 2024-02-04 DIAGNOSIS — H401122 Primary open-angle glaucoma, left eye, moderate stage: Secondary | ICD-10-CM | POA: Diagnosis not present

## 2024-02-04 DIAGNOSIS — H209 Unspecified iridocyclitis: Secondary | ICD-10-CM | POA: Diagnosis not present

## 2024-02-04 DIAGNOSIS — H401113 Primary open-angle glaucoma, right eye, severe stage: Secondary | ICD-10-CM | POA: Diagnosis not present

## 2024-02-04 DIAGNOSIS — Z961 Presence of intraocular lens: Secondary | ICD-10-CM | POA: Diagnosis not present

## 2024-02-27 ENCOUNTER — Non-Acute Institutional Stay: Payer: Self-pay | Admitting: Nurse Practitioner

## 2024-02-27 ENCOUNTER — Encounter: Payer: Self-pay | Admitting: Nurse Practitioner

## 2024-02-27 DIAGNOSIS — M818 Other osteoporosis without current pathological fracture: Secondary | ICD-10-CM

## 2024-02-27 DIAGNOSIS — J3802 Paralysis of vocal cords and larynx, bilateral: Secondary | ICD-10-CM | POA: Diagnosis not present

## 2024-02-27 DIAGNOSIS — F418 Other specified anxiety disorders: Secondary | ICD-10-CM | POA: Diagnosis not present

## 2024-02-27 DIAGNOSIS — N1831 Chronic kidney disease, stage 3a: Secondary | ICD-10-CM

## 2024-02-27 DIAGNOSIS — R634 Abnormal weight loss: Secondary | ICD-10-CM | POA: Diagnosis not present

## 2024-02-27 DIAGNOSIS — I10 Essential (primary) hypertension: Secondary | ICD-10-CM | POA: Diagnosis not present

## 2024-02-27 DIAGNOSIS — E7849 Other hyperlipidemia: Secondary | ICD-10-CM | POA: Diagnosis not present

## 2024-02-27 DIAGNOSIS — K219 Gastro-esophageal reflux disease without esophagitis: Secondary | ICD-10-CM | POA: Diagnosis not present

## 2024-02-27 NOTE — Assessment & Plan Note (Signed)
 Her mood is stable, sleeps and eats well, takes Lexapro  and Mirtazapine , TSH 1.5 04/24/23

## 2024-02-27 NOTE — Assessment & Plan Note (Signed)
 takes Simvastatin. LDL 83 04/24/23

## 2024-02-27 NOTE — Progress Notes (Signed)
 Location:   [facility] Room Number: 915 Place of Service:  ALF (13) Provider: Abner Hoffman Erin Obando NP  Dallana Mavity X, NP  Patient Care Team: Hyatt Capobianco X, NP as PCP - General (Internal Medicine) Moya, Karlyn Overman, MD as Referring Physician (Ophthalmology) Harlen Lick, MD as Consulting Physician (Dermatology) Iline Mallory, MD as Consulting Physician (Otolaryngology) Shoshana Johal X, NP as Nurse Practitioner (Internal Medicine)  Extended Emergency Contact Information Primary Emergency Contact: Myricks,Daniel Mobile Phone: [phone] Relation: Son Secondary Emergency Contact: Clabe Crete Address: [address]          [place], [place] [address] [place] Home Phone: [phone] Mobile Phone: [phone] Relation: Daughter  Code Status: DNR Goals of care: Advanced Directive information    10/20/2023   10:08 AM  Advanced Directives  Does Patient Have a Medical Advance Directive? Yes  Type of Estate agent of [place];Living will  Does patient want to make changes to medical advance directive? No - Patient declined  Copy of Healthcare Power of Attorney in Chart? Yes - validated most recent copy scanned in chart (See row information)     Chief Complaint  Patient presents with   Acute Visit    Intermittent elevated blood pressure    HPI:  Pt is a 89 y.o. female seen today for an acute visit for intermittent elevated blood pressure 02/26/24 178/78, 4/22 199/83, 4/17 198/92, 4/14 223/93. Protocol: recheck Bp to confirm Bp is >180/100, then Clonidine 0.1mg  x1, then re-check which is effective. The patient admitted some fatigue following Clonidine but denied headache, dizziness, change of vision, chest pain/pressure, palpitation, SOB, GI symptoms, or focal weakness associated with elevated Bp or after a dose of Clonidine. The patient stated she is in her usual state of health.   No further left CVA region pain, CT abd: 1. Moderate to  marked left hydronephrosis to the level of the UPJ without an obstructing stone or mass visualized. Underwent urology evaluation, f/u 6 months. 06/06/23 NM renal imaging, 1. Normal renal function. 2. No obstructive hydronephrosis.             Weight loss, stable, on Mirtazapine   HTN, takes Losartan , prn Clonidine,  ASA             CKD Bun/creat 13/0.91 08/26/23             Depression, takes Lexapro  and Mirtazapine , TSH 1.5 04/24/23             Hyperlipidemia, takes Simvastatin . LDL 83 04/24/23             OP DEXA T score -4.3, declined Prolia, takes Vit D, Ca,  Vit D level 40 05/17/20,  off Alendronate  2nd to jaw bone necrotic process             Vocal cord paralysis, underwent ENT evaluation in the past.              GERD, takes Omeprazole, Hgb 13.3 05/27/23             Glaucoma R+L follow Ophthalmology             Chronic wheezes at end of deep inspiration, off DuoNeb 2/2 little efficacy. 05/28/23 CXR no acute disease.        Past Medical History:  Diagnosis Date   Anemia    Anxiety    Apnea 05/2000   sleep study revealed mild upper airway obstruction & apnea during speel, no set recommendations for therapy  Asthma    Diverticulosis    Dyspnea 12/15/09   spirometer normal & function actually worsens with nebk may have more of a "exercise-induced" type of bronchospasm    Fatigue 07/2015   GERD (gastroesophageal reflux disease)    Glaucoma    both eyes   Hyperlipidemia    Hypertension    Laryngeal paresis 09/12/2016   Loss of weight 06/20/2016   OSA (obstructive sleep apnea)    Osteoporosis, senile    Vocal cord dysfunction    per ENT, pt NOT to have elective intubation without disscussing with him   Past Surgical History:  Procedure Laterality Date   CATARACT EXTRACTION W/ INTRAOCULAR LENS  IMPLANT, BILATERAL  2011   Dr. Neil Balls   CERVICAL CONIZATION W/BX     due to cervical dysplasia   COLONOSCOPY  02/2002   Diverticublosis    EYE SURGERY Right 02/2016   laser surgery for scar  tissure   nasolaryngoscopy  2001   reflux laryngitis   SKIN CANCER EXCISION     ?BCC   TONSILLECTOMY     UPPER GI ENDOSCOPY  05/2000   upper airway edema/changes that were consistent with GERD   vocal fold cordotomy Left 2003   Dr. Sherril [facility]    Allergies  Allergen Reactions   Alphagan [Brimonidine] Other (See Comments)   Dorzolamide Hcl Itching    EYE PAIN   Trusopt [Dorzolamide]     Allergies as of 02/27/2024       Reactions   Alphagan [brimonidine] Other (See Comments)   Dorzolamide Hcl Itching   EYE PAIN   Trusopt [dorzolamide]         Medication List        Accurate as of February 27, 2024 11:59 AM. If you have any questions, ask your nurse or doctor.          ascorbic acid 500 MG tablet Commonly known as: VITAMIN C Take 500 mg by mouth daily.   aspirin EC 81 MG tablet Take 81 mg by mouth daily.   brinzolamide 1 % ophthalmic suspension Commonly known as: AZOPT Place 1 drop into both eyes 3 (three) times daily.   Calcium 600+D 600-10 MG-MCG Tabs Generic drug: Calcium Carb-Cholecalciferol Take 1 tablet by mouth daily.   cloNIDine 0.1 MG tablet Commonly known as: CATAPRES Take 0.1 mg by mouth as needed.   cloNIDine 0.1 MG tablet Commonly known as: CATAPRES Take 0.1 mg by mouth as directed. If BP greater than 180/100   escitalopram  10 MG tablet Commonly known as: LEXAPRO  TAKE 1 TABLET BY MOUTH EVERY DAY   ketorolac 0.4 % Soln Commonly known as: ACULAR Place 1 drop into the left eye 3 (three) times daily.   losartan  25 MG tablet Commonly known as: COZAAR  TAKE TWO TABLETS (50MG ) BY MOUTH ONCE DAILY FOR BLOOD PRESSURE   loteprednol 0.2 % Susp Commonly known as: LOTEMAX Place 1 drop into the left eye 2 (two) times daily.   MILK OF MAGNESIA PO Take 30 mLs by mouth as needed.   mirtazapine  7.5 MG tablet Commonly known as: REMERON  TAKE 1 TABLET (7.5 MG TOTAL) BY MOUTH AT BEDTIME. FOR APPETITE   ondansetron 4 MG tablet Commonly  known as: ZOFRAN Take 4 mg by mouth every 8 (eight) hours as needed for nausea or vomiting.   polyethylene glycol 17 g packet Commonly known as: MIRALAX / GLYCOLAX Take 17 g by mouth daily.   simvastatin  10 MG tablet Commonly known as: ZOCOR  TAKE 1  TABLET (10 MG TOTAL) BY MOUTH DAILY AT 12 NOON. OVERDUE FOR LABS   SYSTANE COMPLETE OP Place 1 drop into both eyes in the morning, at noon, in the evening, and at bedtime.   Timolol Maleate (Once-Daily) 0.5 % Soln Place 1 drop into both eyes 2 (two) times daily.        Review of Systems  Constitutional:  Negative for appetite change, fatigue and fever.  HENT:  Positive for hearing loss. Negative for congestion and trouble swallowing.        Vocal cord paralysis.  Raspy voice  Eyes:  Positive for visual disturbance.  Respiratory:  Positive for wheezing. Negative for cough.   Cardiovascular:  Negative for leg swelling.  Gastrointestinal:  Negative for abdominal pain and constipation.       Left CVA soreness resolved  Genitourinary:  Negative for dysuria and urgency.  Musculoskeletal:  Positive for arthralgias and gait problem.  Skin:  Negative for color change.  Neurological:  Negative for speech difficulty, weakness and light-headedness.       Memory lapses.   Psychiatric/Behavioral:  Negative for confusion and sleep disturbance. The patient is not nervous/anxious.     Immunization History  Administered Date(s) Administered   DTaP 08/08/2015   Fluad Quad(high Dose 65+) 08/20/2021, 08/28/2022   Influenza Whole 08/06/2018   Influenza, High Dose Seasonal PF 08/13/2017, 08/21/2019, 08/16/2020, 08/06/2023   Influenza-Unspecified 08/02/2015, 08/15/2016   Moderna Covid-19 Vaccine  Bivalent Booster 52yrs & up 09/05/2022, 08/20/2023   Moderna Sars-Covid-2 Vaccination 11/08/2019, 12/06/2019, 09/12/2020   Pfizer Covid-19 Vaccine Bivalent Booster 52yrs & up 10/18/2021, 02/16/2022   Pneumococcal Conjugate-13 08/02/2015   Pneumococcal  Polysaccharide-23 09/05/1999   Tdap 08/08/2015   Pertinent  Health Maintenance Due  Topic Date Due   INFLUENZA VACCINE  06/04/2024   DEXA SCAN  Completed      05/30/2022    1:57 PM 09/12/2022    9:23 AM 10/03/2022    9:06 AM 03/26/2023    2:59 PM 04/17/2023    8:37 AM  Fall Risk  Falls in the past year? 0 0 0 0 0  Was there an injury with Fall? 0 0 0 0 0  Fall Risk Category Calculator 0 0 0 0 0  Fall Risk Category (Retired) Low Low Low    (RETIRED) Patient Fall Risk Level Low fall risk Low fall risk Low fall risk    Patient at Risk for Falls Due to No Fall Risks No Fall Risks No Fall Risks No Fall Risks No Fall Risks  Fall risk Follow up Falls evaluation completed Falls evaluation completed Falls evaluation completed Falls evaluation completed Falls evaluation completed   Functional Status Survey:    Vitals:   02/27/24 1139  BP: 131/69  Pulse: 66  Resp: 20  Temp: 97.6 F (36.4 C)  SpO2: 93%  Weight: 114 lb 12.8 oz (52.1 kg)   Body mass index is 20.34 kg/m. Physical Exam  Labs reviewed: Recent Labs    04/24/23 0000 08/26/23 0000  NA 142 141  K 4.2 4.4  CL 105 102  CO2 32* 34*  BUN 20 13  CREATININE 0.8 0.9  CALCIUM 9.2 9.3   Recent Labs    04/24/23 0000  AST 18  ALT 8  ALKPHOS 94  ALBUMIN 4.0   Recent Labs    04/24/23 0000  WBC 4.4  NEUTROABS 2,724.00  HGB 13.1  HCT 41  PLT 215   Lab Results  Component Value Date   TSH 2.01 05/14/2022  Lab Results  Component Value Date   HGBA1C 5.6 05/17/2020   Lab Results  Component Value Date   CHOL 163 04/24/2023   HDL 61 04/24/2023   LDLCALC 83 04/24/2023   TRIG 101 04/24/2023   CHOLHDL 2.7 05/14/2022    Significant Diagnostic Results in last 30 days:  No results found.  Assessment/Plan: HTN (hypertension) intermittent elevated blood pressure 02/26/24 178/78, 4/22 199/83, 4/17 198/92, 4/14 223/93. Protocol: recheck Bp to confirm Bp is >180/100, then Clonidine 0.1mg  x1, then re-check which is  effective. The patient admitted some fatigue following Clonidine but denied headache, dizziness, change of vision, chest pain/pressure, palpitation, SOB, GI symptoms, or focal weakness associated with elevated Bp or after a dose of Clonidine. The patient stated she is in her usual state of health.  Continue Losartan , prn Clonidine, ASA Cardiology referral  Weight loss stable, on Mirtazapine    CKD (chronic kidney disease) stage 3, GFR 30-59 ml/min (HCC) Bun/creat 13/0.91 08/26/23  Depression with anxiety Her mood is stable, sleeps and eats well, takes Lexapro  and Mirtazapine , TSH 1.5 04/24/23  Hyperlipidemia takes Simvastatin . LDL 83 04/24/23  Age-related osteoporosis without fracture DEXA T score -4.3, declined Prolia, takes Vit D, Ca,  Vit D level 40 05/17/20,  off Alendronate  2nd to jaw bone necrotic process  Vocal fold paralysis, bilateral underwent ENT evaluation in the past.   GERD (gastroesophageal reflux disease)  takes Omeprazole, Hgb 13.3 05/27/23    Family/ staff Communication: plan of care reviewed with the patient, the patient's son and daughter-HPOA, and charge nurse.   Labs/tests ordered:  none

## 2024-02-27 NOTE — Assessment & Plan Note (Signed)
underwent ENT evaluation in the past.  

## 2024-02-27 NOTE — Assessment & Plan Note (Signed)
 intermittent elevated blood pressure 02/26/24 178/78, 4/22 199/83, 4/17 198/92, 4/14 223/93. Protocol: recheck Bp to confirm Bp is >180/100, then Clonidine 0.1mg  x1, then re-check which is effective. The patient admitted some fatigue following Clonidine but denied headache, dizziness, change of vision, chest pain/pressure, palpitation, SOB, GI symptoms, or focal weakness associated with elevated Bp or after a dose of Clonidine. The patient stated she is in her usual state of health.  Continue Losartan , prn Clonidine, ASA Cardiology referral

## 2024-02-27 NOTE — Assessment & Plan Note (Signed)
stable, on Mirtazapine.

## 2024-02-27 NOTE — Assessment & Plan Note (Signed)
DEXA T score -4.3, declined Prolia, takes Vit D, Ca,  Vit D level 40 05/17/20,  off Alendronate 2nd to jaw bone necrotic process

## 2024-02-27 NOTE — Assessment & Plan Note (Signed)
 Bun/creat 13/0.91 08/26/23

## 2024-02-27 NOTE — Assessment & Plan Note (Signed)
takes Omeprazole, Hgb 13.3 05/27/23

## 2024-03-10 DIAGNOSIS — H35372 Puckering of macula, left eye: Secondary | ICD-10-CM | POA: Diagnosis not present

## 2024-03-10 DIAGNOSIS — H35352 Cystoid macular degeneration, left eye: Secondary | ICD-10-CM | POA: Diagnosis not present

## 2024-03-12 DIAGNOSIS — D649 Anemia, unspecified: Secondary | ICD-10-CM | POA: Insufficient documentation

## 2024-03-12 DIAGNOSIS — J383 Other diseases of vocal cords: Secondary | ICD-10-CM | POA: Insufficient documentation

## 2024-03-12 DIAGNOSIS — H409 Unspecified glaucoma: Secondary | ICD-10-CM | POA: Insufficient documentation

## 2024-03-12 DIAGNOSIS — J45909 Unspecified asthma, uncomplicated: Secondary | ICD-10-CM | POA: Insufficient documentation

## 2024-03-16 ENCOUNTER — Ambulatory Visit

## 2024-03-16 VITALS — BP 126/58 | HR 70 | Ht 65.0 in | Wt 118.4 lb

## 2024-03-16 DIAGNOSIS — I10 Essential (primary) hypertension: Secondary | ICD-10-CM | POA: Diagnosis not present

## 2024-03-16 DIAGNOSIS — J3800 Paralysis of vocal cords and larynx, unspecified: Secondary | ICD-10-CM

## 2024-03-16 DIAGNOSIS — R0681 Apnea, not elsewhere classified: Secondary | ICD-10-CM

## 2024-03-16 DIAGNOSIS — J383 Other diseases of vocal cords: Secondary | ICD-10-CM

## 2024-03-16 MED ORDER — LOSARTAN POTASSIUM 50 MG PO TABS: 50.0000 mg | ORAL_TABLET | Freq: Every day | ORAL | Status: AC

## 2024-03-16 NOTE — Assessment & Plan Note (Signed)
 Blood pressure is well-controlled at this time. Continue with losartan  50 mg once daily. Target blood pressure below 140/90 mmHg. Okay to continue with clonidine 0.1 mg to be used as needed for systolic blood pressure greater than 180 mmHg.  Will hold off on any further testing from cardiac standpoint at this time given lack of any significant symptoms.

## 2024-03-16 NOTE — Progress Notes (Signed)
 Cardiology Consultation:    Date:  03/16/2024   ID:  Angela [name], DOB 11-05-1926, MRN [id number]  PCP:  Mast, Man X, NP  Cardiologist:  Angelena Kells, MD   Referring MD: Mast, Man X, NP   No chief complaint on file.    ASSESSMENT AND PLAN:   Angela [name] 89 year old  history of hypertension, CKD stage III, hyperlipidemia, GERD, depression, glaucoma, exercise induced bronco spasm, vocal cord dysfunction No significant prior cardiac history of CAD, MI, CHF, CVA.  Here for further management of blood pressure. Blood pressures uncontrolled at recent assisted living facility vitals measurement. Here today in the office blood pressure well-controlled. Otherwise no other significant cardiac symptoms.  Problem List Items Addressed This Visit     HTN (hypertension) - Primary (Chronic)   Blood pressure is well-controlled at this time. Continue with losartan  50 mg once daily. Target blood pressure below 140/90 mmHg. Okay to continue with clonidine 0.1 mg to be used as needed for systolic blood pressure greater than 180 mmHg.  Will hold off on any further testing from cardiac standpoint at this time given lack of any significant symptoms.        Relevant Medications   losartan  (COZAAR ) 50 MG tablet   Other Relevant Orders   EKG 12-Lead (Completed)    Further follow-up in the clinic with us  on an as-needed basis.  History of Present Illness:    Angela [name] is a 89 y.o. female who is being seen today for the evaluation of hypertension at the request of Mast, Man X, NP.  Pleasant woman, resident of [facility] assisted living facility. Here for the visit today accompanied by her daughter.  Has history of hypertension, CKD stage III, hyperlipidemia, GERD, depression, glaucoma, exercise induced bronco spasm, vocal cord dysfunction No significant prior cardiac history of CAD, MI, CHF, CVA.  Blood pressure recordings from assisted living facility documents  noted uncontrolled blood pressures with systolics ranging from 140s to 200s and diastolic 70s to 80s.  She herself denies any symptoms of chest pain.  She does have some shortness of breath when in humid environment like taking a shower but otherwise seems to be doing okay ambulates using her walker.  Gets her breakfast in the room and walks to the cafeteria for her lunch and dinner. Denies any chest pain, palpitations, lightheadedness.  No syncopal episode. No pedal edema.  With elevated blood pressures she has been getting clonidine 0.1 mg to be used if systolic blood pressure is above 180 mmHg. Current blood pressure medic patient is on losartan  which appears to be 2 tablets of 25 mg that she has been taking.  EKG in the clinic today shows sinus rhythm heart rate 70/min, PR interval 164 ms, QRS duration 86 ms, QTc 434 ms.  Blood work available to review from 08-26-2023 BUN 13, creatinine 0.91, EGFR 58 suggest CKD stage III Sodium 141, potassium 4.4 Liver function panel from April 24, 2023 normal. Last lipid panel from April 24, 2023 total cholesterol 163, HDL 61, LDL 83, triglycerides 101.   Past Medical History:  Diagnosis Date   Age-related osteoporosis without fracture 09/22/2015   06/06/14 BMD T -2.7, off Alendronate  2nd to jaw bone necrotic process, current taking Vit D, Ca, will update Vit D level.  Due 06/2016     Allergic rhinitis 09/22/2015   Anemia    Anxiety    Apnea 05/2000   sleep study revealed mild upper airway obstruction & apnea  during speel, no set recommendations for therapy   Asthma    Baker cyst, right 06/04/2022   CKD (chronic kidney disease) stage 3, GFR 30-59 ml/min (HCC) 05/11/2021   05/10/21 Na 142, K 4.4, Bun 23, creat 1.0, eGFR 44  11/06/21 Na 144, K 4.1, Bun 18, creat 1.03, eGFR 51, wbc 4.6, Hgb 13.2, Plt 188, neutrophils 48.6%  05/29/23 Na 135, K 4.2, Bun 25, creat 1.35, wbc 8.1, Hgb 12.7, plt 214, neutrophils 76.5%     Diverticulosis    Dry eyes 04/21/2018    Dyspnea 12/15/2009   spirometer normal & function actually worsens with nebk may have more of a "exercise-induced" type of bronchospasm    Entropion eyelid, left 03/18/2019   Entropion of lower eyelid 02/21/2014   Entropion, spastic 12/26/2013   Overview:   RLL     Exposure keratitis 12/26/2013   Overview:   Mechanical lashes on K OD     Fatigue 07/2015   GERD (gastroesophageal reflux disease)    Glaucoma    both eyes   H/O nonmelanoma skin cancer 06/03/2012   H/O vocal cord paralysis 06/03/2012   HTN (hypertension) 09/22/2015   11/17/18 creat 1.04, Na 143, K 4.2  03/15/19 Na 144, K 4.1, Bun 24, creat 1.02  07/06/19 Vit D 46, cholesterol 177, HDL 59, Triglycerides 155, LDL 93, TSH 1.29, Na 142, K 4.4, Bun 23, creat 0.97, eGFR 52, wbc 4.1, Hgb 13,0, plt 183  12/17/19 Hgb a1c 5.8, cholesterol 170, LDL 88, TSH 1.58, Na 143, K 4.2, Bun 24, creat 0.95, wbc 5.0, Hgb 13.1,plt 223  11/30/20 TSH 1.82, LDL 87, Na 140, K 4.6, Bun 19, crea   Hydronephrosis, left 05/28/2023   06/06/23 NM renal imaging, 1. Normal renal function.  2. No obstructive hydronephrosis.     Hyperlipidemia    Hypertension    Laryngeal paresis 09/12/2016   Leukocytosis 05/28/2023   Loss of weight 06/20/2016   Mild cognitive impairment 09/01/2023   11/30/20 TSH 1.82  04/22/23 MMSE 30/30  09/01/23 MMSE 28/30     Myogenic ptosis 11/11/2013   Obstructive sleep apnea 09/22/2015   Underwent neurology eval, done sleep study, dx mild upper airway obstruction and apnea. No set recommended.      OSA (obstructive sleep apnea)    Osteoarthritis 09/22/2015   Multiple sites, R+L knees, fingers     Osteoporosis, senile    Primary open angle glaucoma (POAG) of both eyes 04/21/2018   Pseudophakia 06/03/2012   04/23/23 Ophthalmology Systane  06/18/23 macular edema ophtho, OS     Reflux laryngitis 09/22/2015   Underwent naso laryngoscopy, ENT      Vocal cord dysfunction    per ENT, pt NOT to have elective intubation without disscussing with him    Vocal fold paralysis, bilateral 09/22/2015   Needs a size 4.0 ETT should she ever require intubation per Dr. Fanny Homme, ENT, [facility]  12/03/23 bilateral vocal cord paralysis, stable     Weight loss 06/20/2016   Wheezes 05/28/2023   05/28/23 CXR no acute disease.       Past Surgical History:  Procedure Laterality Date   CATARACT EXTRACTION W/ INTRAOCULAR LENS  IMPLANT, BILATERAL  2011   Dr. Neil Balls   CERVICAL CONIZATION W/BX     due to cervical dysplasia   COLONOSCOPY  02/2002   Diverticublosis    EYE SURGERY Right 02/2016   laser surgery for scar tissure   nasolaryngoscopy  2001   reflux laryngitis   SKIN CANCER EXCISION     ?  [facility]   TONSILLECTOMY     UPPER GI ENDOSCOPY  05/2000   upper airway edema/changes that were consistent with GERD   vocal fold cordotomy Left 2003   Dr. Sherril Dole [facility]    Current Medications: Current Meds  Medication Sig   ascorbic acid (VITAMIN C) 500 MG tablet Take 500 mg by mouth daily.   aspirin EC 81 MG tablet Take 81 mg by mouth daily.   brinzolamide (AZOPT) 1 % ophthalmic suspension Place 1 drop into both eyes 3 (three) times daily.    Calcium Carb-Cholecalciferol (CALCIUM 600+D) 600-10 MG-MCG TABS Take 1 tablet by mouth daily.   cloNIDine (CATAPRES) 0.1 MG tablet Take 0.1 mg by mouth as needed.   cloNIDine (CATAPRES) 0.1 MG tablet Take 0.1 mg by mouth as directed. If BP greater than 180/100   escitalopram  (LEXAPRO ) 10 MG tablet TAKE 1 TABLET BY MOUTH EVERY DAY   hypromellose (GENTEAL) 0.3 % GEL ophthalmic ointment Place 1 Application into both eyes at bedtime.   ketorolac (ACULAR) 0.4 % SOLN Place 1 drop into the left eye 3 (three) times daily.   loteprednol (LOTEMAX) 0.2 % SUSP Place 1 drop into the left eye 2 (two) times daily.   Magnesium Hydroxide (MILK OF MAGNESIA PO) Take 30 mLs by mouth as needed.   mirtazapine  (REMERON ) 7.5 MG tablet TAKE 1 TABLET (7.5 MG TOTAL) BY MOUTH AT BEDTIME. FOR APPETITE   ondansetron (ZOFRAN) 4 MG tablet  Take 4 mg by mouth every 8 (eight) hours as needed for nausea or vomiting.   polyethylene glycol (MIRALAX / GLYCOLAX) 17 g packet Take 17 g by mouth daily.   Propylene Glycol (SYSTANE COMPLETE OP) Place 1 drop into both eyes in the morning, at noon, in the evening, and at bedtime.   simvastatin  (ZOCOR ) 10 MG tablet TAKE 1 TABLET (10 MG TOTAL) BY MOUTH DAILY AT 12 NOON. OVERDUE FOR LABS   Timolol Maleate 0.5 % (DAILY) SOLN Place 1 drop into both eyes 2 (two) times daily.    [DISCONTINUED] losartan  (COZAAR ) 25 MG tablet TAKE TWO TABLETS (50MG ) BY MOUTH ONCE DAILY FOR BLOOD PRESSURE     Allergies:   Alphagan [brimonidine], Dorzolamide hcl, and Trusopt [dorzolamide]   Social History   Socioeconomic History   Marital status: Widowed    Spouse name: Not on file   Number of children: 3   Years of education: Not on file   Highest education level: Not on file  Occupational History   Occupation: Retired  Tobacco Use   Smoking status: Never   Smokeless tobacco: Current    Types: Chew, Snuff  Vaping Use   Vaping status: Never Used  Substance and Sexual Activity   Alcohol use: No   Drug use: No   Sexual activity: Never  Other Topics Concern   Not on file  Social History Narrative   L[facility] at [facility]   Widowed   Previously smoked stopped 1986   Alcohol none   Caffeine occasionally   Exercise walk   Social Drivers of Health   Financial Resource Strain: Medium Risk (10/03/2017)   Overall Financial Resource Strain (CARDIA)    Difficulty of Paying Living Expenses: Somewhat hard  Food Insecurity: No Food Insecurity (10/03/2017)   Hunger Vital Sign    Worried About Running Out of Food in the Last Year: Never true    Ran Out of Food in the Last Year: Never true  Transportation Needs: No Transportation Needs (10/03/2017)   PRAPARE -  Administrator, Civil Service (Medical): No    Lack of Transportation (Non-Medical): No  Physical Activity: Insufficiently Active  (10/03/2017)   Exercise Vital Sign    Days of Exercise per Week: 3 days    Minutes of Exercise per Session: 30 min  Stress: Stress Concern Present (10/05/2018)   [employer] of Occupational Health - Occupational Stress Questionnaire    Feeling of Stress : Rather much  Social Connections: Somewhat Isolated (10/03/2017)   Social Connection and Isolation Panel [NHANES]    Frequency of Communication with Friends and Family: More than three times a week    Frequency of Social Gatherings with Friends and Family: More than three times a week    Attends Religious Services: 1 to 4 times per year    Active Member of [employer] or Organizations: No    Attends Banker Meetings: Never    Marital Status: Widowed     Family History: The patient's family history includes Cancer in her brother; Melanoma in her mother; Multiple sclerosis in her sister; Transient ischemic attack in her father. There is no history of Colon cancer. ROS:   Please see the history of present illness.    All 14 point review of systems negative except as described per history of present illness.  EKGs/Labs/Other Studies Reviewed:    The following studies were reviewed today:   EKG:  EKG Interpretation Date/Time:  Tuesday Mar 16 2024 14:32:24 EDT Ventricular Rate:  70 PR Interval:  164 QRS Duration:  86 QT Interval:  402 QTC Calculation: 434 R Axis:   49  Text Interpretation: Normal sinus rhythm Cannot rule out Anterior infarct (cited on or before 08-Oct-2018) When compared with ECG of 08-Oct-2018 19:08, No significant change was found Confirmed by Bertha Broad reddy [phone]) on 03/16/2024 2:43:12 PM    Recent Labs: 04/24/2023: ALT 8; Hemoglobin 13.1; Platelets 215 08/26/2023: BUN 13; Creatinine 0.9; Potassium 4.4; Sodium 141  Recent Lipid Panel    Component Value Date/Time   CHOL 163 04/24/2023 0000   TRIG 101 04/24/2023 0000   HDL 61 04/24/2023 0000   CHOLHDL 2.7 05/14/2022 0700   VLDL 33 (H)  04/02/2017 0801   LDLCALC 83 04/24/2023 0000   LDLCALC 85 05/14/2022 0700    Physical Exam:    VS:  BP (!) 126/58   Pulse 70   Ht 5\' 5"  (1.651 m)   Wt 118 lb 6.4 oz (53.7 kg)   SpO2 93%   BMI 19.70 kg/m     Wt Readings from Last 3 Encounters:  03/16/24 118 lb 6.4 oz (53.7 kg)  02/27/24 114 lb 12.8 oz (52.1 kg)  12/31/23 114 lb 3.2 oz (51.8 kg)     GENERAL:  Well nourished, well developed in no acute distress NECK: No JVD; No carotid bruits CARDIAC: RRR, S1 and S2 present, no murmurs, no rubs, no gallops CHEST:  Clear to auscultation without rales, wheezing or rhonchi  Extremities: No pitting pedal edema. Pulses bilaterally symmetric with radial 2+ and dorsalis pedis 2+ NEUROLOGIC:  Alert and oriented x 3  Medication Adjustments/Labs and Tests Ordered: Current medicines are reviewed at length with the patient today.  Concerns regarding medicines are outlined above.  Orders Placed This Encounter  Procedures   EKG 12-Lead   Meds ordered this encounter  Medications   losartan  (COZAAR ) 50 MG tablet    Sig: Take 1 tablet (50 mg total) by mouth daily.    Signed, Ural Acree reddy Mazell Aylesworth, MD, MPH,  FACC. 03/16/2024 3:16 PM    [facility]

## 2024-03-16 NOTE — Patient Instructions (Addendum)
 Medication Instructions:    Take Losartan  50 mg once a day in the morning.    *If you need a refill on your cardiac medications before your next appointment, please call your pharmacy*   Lab Work: None Ordered If you have labs (blood work) drawn today and your tests are completely normal, you will receive your results only by: MyChart Message (if you have MyChart) OR A paper copy in the mail If you have any lab test that is abnormal or we need to change your treatment, we will call you to review the results.   Testing/Procedures: None Ordered   Follow-Up: At [facility], you and your health needs are our priority.  As part of our continuing mission to provide you with exceptional heart care, we have created designated Provider Care Teams.  These Care Teams include your primary Cardiologist (physician) and Advanced Practice Providers (APPs -  Physician Assistants and Nurse Practitioners) who all work together to provide you with the care you need, when you need it.  We recommend signing up for the patient portal called "MyChart".  Sign up information is provided on this After Visit Summary.  MyChart is used to connect with patients for Virtual Visits (Telemedicine).  Patients are able to view lab/test results, encounter notes, upcoming appointments, etc.  Non-urgent messages can be sent to your provider as well.   To learn more about what you can do with MyChart, go to [email or web address].    Your next appointment:   Follow up as needed

## 2024-03-17 DIAGNOSIS — N130 Hydronephrosis with ureteropelvic junction obstruction: Secondary | ICD-10-CM | POA: Diagnosis not present

## 2024-04-12 ENCOUNTER — Telehealth: Payer: Self-pay

## 2024-04-12 NOTE — Telephone Encounter (Signed)
 Pt c/o medication issue:  1. Name of Medication: losartan  (COZAAR ) 50 MG tablet   2. How are you currently taking this medication (dosage and times per day)? As written   3. Are you having a reaction (difficulty breathing--STAT)? No   4. What is your medication issue? Pts daughter states this medication isn't helping the pt after the last visit nothing has improved

## 2024-04-12 NOTE — Telephone Encounter (Signed)
 Spoke with Angela [name] per DPR and advised to send BP log in MyChart for review. Angela [name] verbalized understanding and had no additional questions.

## 2024-04-14 MED ORDER — CLONIDINE HCL 0.1 MG PO TABS: 0.1000 mg | ORAL_TABLET | Freq: Two times a day (BID) | ORAL | Status: DC

## 2024-05-06 ENCOUNTER — Non-Acute Institutional Stay: Payer: Self-pay | Admitting: Sports Medicine

## 2024-05-06 DIAGNOSIS — F411 Generalized anxiety disorder: Secondary | ICD-10-CM | POA: Diagnosis not present

## 2024-05-06 DIAGNOSIS — F5101 Primary insomnia: Secondary | ICD-10-CM | POA: Diagnosis not present

## 2024-05-06 DIAGNOSIS — I10 Essential (primary) hypertension: Secondary | ICD-10-CM | POA: Diagnosis not present

## 2024-05-06 DIAGNOSIS — E782 Mixed hyperlipidemia: Secondary | ICD-10-CM

## 2024-05-06 DIAGNOSIS — H409 Unspecified glaucoma: Secondary | ICD-10-CM | POA: Diagnosis not present

## 2024-05-06 NOTE — Progress Notes (Signed)
 Provider:  Dr. Jackalyn Blazing Location:  [facility] Place of Service:   Assisted living  PCP: Mast, Man X, NP Patient Care Team: Mast, Man X, NP as PCP - General (Internal Medicine) Moya, Dempsey Pac, MD as Referring Physician (Ophthalmology) Joshua Blamer, MD as Consulting Physician (Dermatology) Brien Garnette Pepper, MD as Consulting Physician (Otolaryngology) Mast, Man X, NP as Nurse Practitioner (Internal Medicine)  Extended Emergency Contact Information Primary Emergency Contact: Ostrosky,Daniel Mobile Phone: [phone] Relation: Son Secondary Emergency Contact: Dwain Pool Address: [address]          [place] [address], [place] [address] [place] Home Phone: [phone] Mobile Phone: [phone] Relation: Daughter  Goals of Care: Advanced Directive information    10/20/2023   10:08 AM  Advanced Directives  Does Patient Have a Medical Advance Directive? Yes  Type of Estate agent of [place];Living will  Does patient want to make changes to medical advance directive? No - Patient declined  Copy of Healthcare Power of Attorney in Chart? Yes - validated most recent copy scanned in chart (See row information)      No chief complaint on file.    History of Present Illness  89 yr old F with h/o HTN, CKD, HLD, GERD, Glaucoma is evaluated for chronic disease management.  Pt seen and examined in her room, son at bed side.  Pt seems pleasant and comfortable and does not appear to be in distress.  Pt denies chest pain, palpitations, SOB, abdominal pain nausea, vomiting, dysuria, hematuria, bloody or dark stools. She ambulates with a walker. No recent falls. Pt has been having high blood pressure readings intermittently. She followed with cardiology and recommended to start hydralazine  25 mg prn in addition to losartan  . Pt c/o occasional dizziness when standing too quickly     Past Medical History:  Diagnosis Date    Age-related osteoporosis without fracture 09/22/2015   06/06/14 BMD T -2.7, off Alendronate  2nd to jaw bone necrotic process, current taking Vit D, Ca, will update Vit D level.  Due 06/2016     Allergic rhinitis 09/22/2015   Anemia    Anxiety    Apnea 05/2000   sleep study revealed mild upper airway obstruction & apnea during speel, no set recommendations for therapy   Asthma    Baker cyst, right 06/04/2022   CKD (chronic kidney disease) stage 3, GFR 30-59 ml/min (HCC) 05/11/2021   05/10/21 Na 142, K 4.4, Bun 23, creat 1.0, eGFR 44  11/06/21 Na 144, K 4.1, Bun 18, creat 1.03, eGFR 51, wbc 4.6, Hgb 13.2, Plt 188, neutrophils 48.6%  05/29/23 Na 135, K 4.2, Bun 25, creat 1.35, wbc 8.1, Hgb 12.7, plt 214, neutrophils 76.5%     Diverticulosis    Dry eyes 04/21/2018   Dyspnea 12/15/2009   spirometer normal & function actually worsens with nebk may have more of a exercise-induced type of bronchospasm    Entropion eyelid, left 03/18/2019   Entropion of lower eyelid 02/21/2014   Entropion, spastic 12/26/2013   Overview:   RLL     Exposure keratitis 12/26/2013   Overview:   Mechanical lashes on K OD     Fatigue 07/2015   GERD (gastroesophageal reflux disease)    Glaucoma    both eyes   H/O nonmelanoma skin cancer 06/03/2012   H/O vocal cord paralysis 06/03/2012   HTN (hypertension) 09/22/2015   11/17/18 creat 1.04, Na 143, K 4.2  03/15/19 Na 144, K 4.1, Bun 24, creat 1.02  07/06/19  Vit D 46, cholesterol 177, HDL 59, Triglycerides 155, LDL 93, TSH 1.29, Na 142, K 4.4, Bun 23, creat 0.97, eGFR 52, wbc 4.1, Hgb 13,0, plt 183  12/17/19 Hgb a1c 5.8, cholesterol 170, LDL 88, TSH 1.58, Na 143, K 4.2, Bun 24, creat 0.95, wbc 5.0, Hgb 13.1,plt 223  11/30/20 TSH 1.82, LDL 87, Na 140, K 4.6, Bun 19, crea   Hydronephrosis, left 05/28/2023   06/06/23 NM renal imaging, 1. Normal renal function.  2. No obstructive hydronephrosis.     Hyperlipidemia    Hypertension    Laryngeal paresis 09/12/2016   Leukocytosis  05/28/2023   Loss of weight 06/20/2016   Mild cognitive impairment 09/01/2023   11/30/20 TSH 1.82  04/22/23 MMSE 30/30  09/01/23 MMSE 28/30     Myogenic ptosis 11/11/2013   Obstructive sleep apnea 09/22/2015   Underwent neurology eval, done sleep study, dx mild upper airway obstruction and apnea. No set recommended.      OSA (obstructive sleep apnea)    Osteoarthritis 09/22/2015   Multiple sites, R+L knees, fingers     Osteoporosis, senile    Primary open angle glaucoma (POAG) of both eyes 04/21/2018   Pseudophakia 06/03/2012   04/23/23 Ophthalmology Systane  06/18/23 macular edema ophtho, OS     Reflux laryngitis 09/22/2015   Underwent naso laryngoscopy, ENT      Vocal cord dysfunction    per ENT, pt NOT to have elective intubation without disscussing with him   Vocal fold paralysis, bilateral 09/22/2015   Needs a size 4.0 ETT should she ever require intubation per Dr. CANDIE Franchot Silvan, ENT, [facility]  12/03/23 bilateral vocal cord paralysis, stable     Weight loss 06/20/2016   Wheezes 05/28/2023   05/28/23 CXR no acute disease.      Past Surgical History:  Procedure Laterality Date   CATARACT EXTRACTION W/ INTRAOCULAR LENS  IMPLANT, BILATERAL  2011   Dr. Carrie   CERVICAL CONIZATION W/BX     due to cervical dysplasia   COLONOSCOPY  02/2002   Diverticublosis    EYE SURGERY Right 02/2016   laser surgery for scar tissure   nasolaryngoscopy  2001   reflux laryngitis   SKIN CANCER EXCISION     ?BCC   TONSILLECTOMY     UPPER GI ENDOSCOPY  05/2000   upper airway edema/changes that were consistent with GERD   vocal fold cordotomy Left 2003   Dr. Franchot Silvan [facility]    reports that she has never smoked. Her smokeless tobacco use includes chew and snuff. She reports that she does not drink alcohol and does not use drugs. Social History   Socioeconomic History   Marital status: Widowed    Spouse name: Not on file   Number of children: 3   Years of education: Not on file   Highest  education level: Not on file  Occupational History   Occupation: Retired  Tobacco Use   Smoking status: Never   Smokeless tobacco: Current    Types: Chew, Snuff  Vaping Use   Vaping status: Never Used  Substance and Sexual Activity   Alcohol use: No   Drug use: No   Sexual activity: Never  Other Topics Concern   Not on file  Social History Narrative   Lives at [facility]   Widowed   Previously smoked stopped 1986   Alcohol none   Caffeine occasionally   Exercise walk   Social Drivers of Health   Financial Resource Strain: Medium  Risk (10/03/2017)   Overall Financial Resource Strain (CARDIA)    Difficulty of Paying Living Expenses: Somewhat hard  Food Insecurity: No Food Insecurity (10/03/2017)   Hunger Vital Sign    Worried About Running Out of Food in the Last Year: Never true    Ran Out of Food in the Last Year: Never true  Transportation Needs: No Transportation Needs (10/03/2017)   PRAPARE - Administrator, Civil Service (Medical): No    Lack of Transportation (Non-Medical): No  Physical Activity: Insufficiently Active (10/03/2017)   Exercise Vital Sign    Days of Exercise per Week: 3 days    Minutes of Exercise per Session: 30 min  Stress: Stress Concern Present (10/05/2018)   [employer] of Occupational Health - Occupational Stress Questionnaire    Feeling of Stress : Rather much  Social Connections: Somewhat Isolated (10/03/2017)   Social Connection and Isolation Panel    Frequency of Communication with Friends and Family: More than three times a week    Frequency of Social Gatherings with Friends and Family: More than three times a week    Attends Religious Services: 1 to 4 times per year    Active Member of [employer] or Organizations: No    Attends Banker Meetings: Never    Marital Status: Widowed  Intimate Partner Violence: Not At Risk (10/03/2017)   Humiliation, Afraid, Rape, and Kick questionnaire    Fear of Current  or Ex-Partner: No    Emotionally Abused: No    Physically Abused: No    Sexually Abused: No    Functional Status Survey:    Family History  Problem Relation Age of Onset   Melanoma Mother    Transient ischemic attack Father    Multiple sclerosis Sister    Cancer Brother    Colon cancer Neg Hx     Health Maintenance  Topic Date Due   Medicare Annual Wellness (AWV)  10/04/2023   COVID-19 Vaccine (8 - 2024-25 season) 10/15/2023   Zoster Vaccines- Shingrix (1 of 2) 11/04/2094 (Originally [birth year]/1979)   INFLUENZA VACCINE  06/04/2024   DTaP/Tdap/Td (3 - Td or Tdap) 08/07/2025   Pneumococcal Vaccine: 50+ Years  Completed   DEXA SCAN  Completed   Hepatitis B Vaccines  Aged Out   HPV VACCINES  Aged Out   Meningococcal B Vaccine  Aged Out    Allergies  Allergen Reactions   Alphagan [Brimonidine] Other (See Comments)   Dorzolamide Hcl Itching    EYE PAIN   Trusopt [Dorzolamide]     Outpatient Encounter Medications as of 05/06/2024  Medication Sig   ascorbic acid (VITAMIN C) 500 MG tablet Take 500 mg by mouth daily.   aspirin EC 81 MG tablet Take 81 mg by mouth daily.   brinzolamide (AZOPT) 1 % ophthalmic suspension Place 1 drop into both eyes 3 (three) times daily.    Calcium Carb-Cholecalciferol (CALCIUM 600+D) 600-10 MG-MCG TABS Take 1 tablet by mouth daily.   cloNIDine  (CATAPRES ) 0.1 MG tablet Take 1 tablet (0.1 mg total) by mouth 2 (two) times daily.   escitalopram  (LEXAPRO ) 10 MG tablet TAKE 1 TABLET BY MOUTH EVERY DAY   hypromellose (GENTEAL) 0.3 % GEL ophthalmic ointment Place 1 Application into both eyes at bedtime.   ketorolac (ACULAR) 0.4 % SOLN Place 1 drop into the left eye 3 (three) times daily.   losartan  (COZAAR ) 50 MG tablet Take 1 tablet (50 mg total) by mouth daily.   loteprednol (LOTEMAX) 0.2 %  SUSP Place 1 drop into the left eye 2 (two) times daily.   Magnesium Hydroxide (MILK OF MAGNESIA PO) Take 30 mLs by mouth as needed.   mirtazapine  (REMERON ) 7.5 MG  tablet TAKE 1 TABLET (7.5 MG TOTAL) BY MOUTH AT BEDTIME. FOR APPETITE   ondansetron (ZOFRAN) 4 MG tablet Take 4 mg by mouth every 8 (eight) hours as needed for nausea or vomiting.   polyethylene glycol (MIRALAX / GLYCOLAX) 17 g packet Take 17 g by mouth daily.   Propylene Glycol (SYSTANE COMPLETE OP) Place 1 drop into both eyes in the morning, at noon, in the evening, and at bedtime.   simvastatin  (ZOCOR ) 10 MG tablet TAKE 1 TABLET (10 MG TOTAL) BY MOUTH DAILY AT 12 NOON. OVERDUE FOR LABS   Timolol Maleate 0.5 % (DAILY) SOLN Place 1 drop into both eyes 2 (two) times daily.    No facility-administered encounter medications on file as of 05/06/2024.    Review of Systems  Constitutional:  Negative for fever.  Respiratory:  Negative for cough, shortness of breath and wheezing.   Cardiovascular:  Negative for chest pain, palpitations and leg swelling.  Gastrointestinal:  Negative for abdominal distention, abdominal pain, blood in stool, constipation, diarrhea and nausea.  Genitourinary:  Negative for dysuria.   Negative unless indicated in HPI.  There were no vitals filed for this visit. There is no height or weight on file to calculate BMI. BP Readings from Last 3 Encounters:  03/16/24 (!) 126/58  02/27/24 131/69  12/31/23 (!) 140/75   Wt Readings from Last 3 Encounters:  03/16/24 118 lb 6.4 oz (53.7 kg)  02/27/24 114 lb 12.8 oz (52.1 kg)  12/31/23 114 lb 3.2 oz (51.8 kg)   Physical Exam Constitutional:      Appearance: Normal appearance.  HENT:     Head: Normocephalic and atraumatic.  Cardiovascular:     Rate and Rhythm: Normal rate and regular rhythm.  Pulmonary:     Effort: Pulmonary effort is normal. No respiratory distress.     Breath sounds: Normal breath sounds. No wheezing.  Abdominal:     General: Bowel sounds are normal. There is no distension.     Tenderness: There is no abdominal tenderness. There is no guarding or rebound.     Comments:    Musculoskeletal:         General: No swelling.  Neurological:     Mental Status: She is alert. Mental status is at baseline.     Motor: No weakness.     Labs reviewed: Basic Metabolic Panel: Recent Labs    08/26/23 0000  NA 141  K 4.4  CL 102  CO2 34*  BUN 13  CREATININE 0.9  CALCIUM 9.3   Liver Function Tests: No results for input(s): AST, ALT, ALKPHOS, BILITOT, PROT, ALBUMIN in the last 8760 hours. No results for input(s): LIPASE, AMYLASE in the last 8760 hours. No results for input(s): AMMONIA in the last 8760 hours. CBC: No results for input(s): WBC, NEUTROABS, HGB, HCT, MCV, PLT in the last 8760 hours. Cardiac Enzymes: No results for input(s): CKTOTAL, CKMB, CKMBINDEX, TROPONINI in the last 8760 hours. BNP: Invalid input(s): POCBNP Lab Results  Component Value Date   HGBA1C 5.6 05/17/2020   Lab Results  Component Value Date   TSH 2.01 05/14/2022   Lab Results  Component Value Date   VITAMINB12 307 04/24/2023   No results found for: FOLATE No results found for: IRON, TIBC, FERRITIN  Imaging and Procedures obtained prior  to SNF admission: US  Abdomen Complete Result Date: 06/15/2023 CLINICAL DATA:  Hydronephrosis. EXAM: ABDOMEN ULTRASOUND COMPLETE COMPARISON:  CT 05/27/2023 FINDINGS: Gallbladder: No gallstones or wall thickening visualized. No sonographic Murphy sign noted by sonographer. Common bile duct: Diameter: 2.1 mm. Liver: No focal lesion identified. Within normal limits in parenchymal echogenicity. Portal vein is patent on color Doppler imaging with normal direction of blood flow towards the liver. IVC: No abnormality visualized. Pancreas: Visualized portion unremarkable. Spleen: Size and appearance within normal limits. Right Kidney: Length: 11.0 cm. Echogenicity within normal limits. No mass or hydronephrosis visualized. 3 cm cyst with single thin septation. Left Kidney: Length: 10.2 cm. Echogenicity within normal limits. Minimal  hydronephrosis. Abdominal aorta: No aneurysm visualized. Mild atherosclerotic disease. Other findings: No free fluid. IMPRESSION: 1. No acute findings. 2. 3 cm right renal cyst. 3. Minimal known left hydronephrosis. Electronically Signed   By: Toribio Agreste M.D.   On: 06/15/2023 12:31    Assessment and Plan Assessment & Plan  HTN  Pt denies headache, nausea, vomiting, blurry or double vision  Neuro exam unremarkable Instructed nursing staff to have patient sit for 10 min before recording bp  Instructed to give hydralazine  prn if bp above 180  Avoid salty foods Monitor bp   Glaucoma Follow up with ophthalmology Eye drops as per ophtho  Insomnia Cont with rameron   HLD  Cont with zocor     GAD  Doing better  Cont with lexapro    30 min Total time spent for obtaining history,  performing a medically appropriate examination and evaluation, reviewing the tests,  documenting clinical information in the electronic or other health record,  ,care coordination (not separately reported)

## 2024-05-10 MED ORDER — HYDRALAZINE HCL 10 MG PO TABS: 10.0000 mg | ORAL_TABLET | Freq: Three times a day (TID) | ORAL | Status: DC

## 2024-05-10 NOTE — Addendum Note (Signed)
 Addended by: ONEITA BERLINER on: 05/10/2024 02:17 PM   Modules accepted: Orders

## 2024-05-12 NOTE — Telephone Encounter (Signed)
 Chanel with [facility] called to confirm that pt is still supposed to be taking Losartan .

## 2024-05-13 DIAGNOSIS — H35352 Cystoid macular degeneration, left eye: Secondary | ICD-10-CM | POA: Diagnosis not present

## 2024-05-14 ENCOUNTER — Encounter: Payer: Self-pay | Admitting: Sports Medicine

## 2024-05-18 ENCOUNTER — Encounter: Payer: Self-pay | Admitting: Sports Medicine

## 2024-05-19 ENCOUNTER — Encounter: Payer: Self-pay | Admitting: Adult Health

## 2024-05-19 ENCOUNTER — Non-Acute Institutional Stay: Payer: Self-pay | Admitting: Adult Health

## 2024-05-19 DIAGNOSIS — F419 Anxiety disorder, unspecified: Secondary | ICD-10-CM | POA: Diagnosis not present

## 2024-05-19 DIAGNOSIS — I130 Hypertensive heart and chronic kidney disease with heart failure and stage 1 through stage 4 chronic kidney disease, or unspecified chronic kidney disease: Secondary | ICD-10-CM

## 2024-05-19 DIAGNOSIS — N183 Chronic kidney disease, stage 3 unspecified: Secondary | ICD-10-CM

## 2024-05-19 NOTE — Progress Notes (Signed)
 Location:  [facility] Room Number: [phone] Place of Service:  ALF [id number]) Provider:  Medina-Vargas, Nicolet Griffy, DNP, FNP-BC  Patient Care Team: Mast, Man X, NP as PCP - General (Internal Medicine) Moya, Dempsey Pac, MD as Referring Physician (Ophthalmology) Joshua Blamer, MD as Consulting Physician (Dermatology) Brien Garnette Pepper, MD as Consulting Physician (Otolaryngology) Mast, Man X, NP as Nurse Practitioner (Internal Medicine)  Extended Emergency Contact Information Primary Emergency Contact: Sides,Daniel Mobile Phone: [phone] Relation: Son Secondary Emergency Contact: Dwain Pool Address: [address]          [place], [place] [address] [place] Home Phone: [phone] Mobile Phone: [phone] Relation: Daughter  Code Status:  DNR  Goals of care: Advanced Directive information    05/19/2024   10:35 AM  Advanced Directives  Does Patient Have a Medical Advance Directive? Yes  Type of Estate agent of [place];Living will;Out of facility DNR (pink MOST or yellow form)  Does patient want to make changes to medical advance directive? No - Patient declined  Copy of Healthcare Power of Attorney in Chart? Yes - validated most recent copy scanned in chart (See row information)     Chief Complaint  Patient presents with   Hypertension     hypertension management     HPI:  Pt is a 89 y.o. female seen today for an acute visit regarding elevated BP. She is a resident of [facility]. She was reported to have elevated BP -  171/79, 189/80, 178/77, 158/69, 156/82, 168/63.  BP today 123/63. Patient was seen in her room. She denies headache, shortness of breath nor chest pains. She is currently taking Hydralazine  25 mg TID and Losartan  50 mg daily for hypertension.  Daughter came to visit and discussed BP medications. BP today 123/63.  She takes Lexapro  20 mg daily for anxietyn. She denies feeling  anxious.   Past Medical History:  Diagnosis Date   Age-related osteoporosis without fracture 09/22/2015   06/06/14 BMD T -2.7, off Alendronate  2nd to jaw bone necrotic process, current taking Vit D, Ca, will update Vit D level.  Due 06/2016     Allergic rhinitis 09/22/2015   Anemia    Anxiety    Apnea 05/2000   sleep study revealed mild upper airway obstruction & apnea during speel, no set recommendations for therapy   Asthma    Baker cyst, right 06/04/2022   CKD (chronic kidney disease) stage 3, GFR 30-59 ml/min (HCC) 05/11/2021   05/10/21 Na 142, K 4.4, Bun 23, creat 1.0, eGFR 44  11/06/21 Na 144, K 4.1, Bun 18, creat 1.03, eGFR 51, wbc 4.6, Hgb 13.2, Plt 188, neutrophils 48.6%  05/29/23 Na 135, K 4.2, Bun 25, creat 1.35, wbc 8.1, Hgb 12.7, plt 214, neutrophils 76.5%     Diverticulosis    Dry eyes 04/21/2018   Dyspnea 12/15/2009   spirometer normal & function actually worsens with nebk may have more of a exercise-induced type of bronchospasm    Entropion eyelid, left 03/18/2019   Entropion of lower eyelid 02/21/2014   Entropion, spastic 12/26/2013   Overview:   RLL     Exposure keratitis 12/26/2013   Overview:   Mechanical lashes on K OD     Fatigue 07/2015   GERD (gastroesophageal reflux disease)    Glaucoma    both eyes   H/O nonmelanoma skin cancer 06/03/2012   H/O vocal cord paralysis 06/03/2012   HTN (hypertension) 09/22/2015   11/17/18 creat 1.04, Na  143, K 4.2  03/15/19 Na 144, K 4.1, Bun 24, creat 1.02  07/06/19 Vit D 46, cholesterol 177, HDL 59, Triglycerides 155, LDL 93, TSH 1.29, Na 142, K 4.4, Bun 23, creat 0.97, eGFR 52, wbc 4.1, Hgb 13,0, plt 183  12/17/19 Hgb a1c 5.8, cholesterol 170, LDL 88, TSH 1.58, Na 143, K 4.2, Bun 24, creat 0.95, wbc 5.0, Hgb 13.1,plt 223  11/30/20 TSH 1.82, LDL 87, Na 140, K 4.6, Bun 19, crea   Hydronephrosis, left 05/28/2023   06/06/23 NM renal imaging, 1. Normal renal function.  2. No obstructive hydronephrosis.     Hyperlipidemia    Hypertension     Laryngeal paresis 09/12/2016   Leukocytosis 05/28/2023   Loss of weight 06/20/2016   Mild cognitive impairment 09/01/2023   11/30/20 TSH 1.82  04/22/23 MMSE 30/30  09/01/23 MMSE 28/30     Myogenic ptosis 11/11/2013   Obstructive sleep apnea 09/22/2015   Underwent neurology eval, done sleep study, dx mild upper airway obstruction and apnea. No set recommended.      OSA (obstructive sleep apnea)    Osteoarthritis 09/22/2015   Multiple sites, R+L knees, fingers     Osteoporosis, senile    Primary open angle glaucoma (POAG) of both eyes 04/21/2018   Pseudophakia 06/03/2012   04/23/23 Ophthalmology Systane  06/18/23 macular edema ophtho, OS     Reflux laryngitis 09/22/2015   Underwent naso laryngoscopy, ENT      Vocal cord dysfunction    per ENT, pt NOT to have elective intubation without disscussing with him   Vocal fold paralysis, bilateral 09/22/2015   Needs a size 4.0 ETT should she ever require intubation per Dr. CANDIE Franchot Silvan, ENT, [facility]  12/03/23 bilateral vocal cord paralysis, stable     Weight loss 06/20/2016   Wheezes 05/28/2023   05/28/23 CXR no acute disease.      Past Surgical History:  Procedure Laterality Date   CATARACT EXTRACTION W/ INTRAOCULAR LENS  IMPLANT, BILATERAL  2011   Dr. Carrie   CERVICAL CONIZATION W/BX     due to cervical dysplasia   COLONOSCOPY  02/2002   Diverticublosis    EYE SURGERY Right 02/2016   laser surgery for scar tissure   nasolaryngoscopy  2001   reflux laryngitis   SKIN CANCER EXCISION     ?BCC   TONSILLECTOMY     UPPER GI ENDOSCOPY  05/2000   upper airway edema/changes that were consistent with GERD   vocal fold cordotomy Left 2003   Dr. Franchot Silvan [facility]    Allergies  Allergen Reactions   Alphagan [Brimonidine] Other (See Comments)   Dorzolamide Hcl Itching    EYE PAIN   Trusopt [Dorzolamide]     Outpatient Encounter Medications as of 05/19/2024  Medication Sig   ascorbic acid (VITAMIN C) 500 MG tablet Take 500 mg by mouth  daily.   aspirin EC 81 MG tablet Take 81 mg by mouth daily.   brinzolamide (AZOPT) 1 % ophthalmic suspension Place 1 drop into both eyes 3 (three) times daily.    Calcium Carb-Cholecalciferol (CALCIUM 600+D) 600-10 MG-MCG TABS Take 1 tablet by mouth daily.   escitalopram  (LEXAPRO ) 10 MG tablet TAKE 1 TABLET BY MOUTH EVERY DAY   hydrALAZINE  (APRESOLINE ) 10 MG tablet Take 1 tablet (10 mg total) by mouth 3 (three) times daily.   ketorolac (ACULAR) 0.4 % SOLN Place 1 drop into the left eye 3 (three) times daily.   losartan  (COZAAR ) 50 MG tablet Take 1 tablet (  50 mg total) by mouth daily.   loteprednol (LOTEMAX) 0.2 % SUSP Place 1 drop into the left eye 2 (two) times daily.   Magnesium Hydroxide (MILK OF MAGNESIA PO) Take 30 mLs by mouth as needed.   mirtazapine  (REMERON ) 7.5 MG tablet TAKE 1 TABLET (7.5 MG TOTAL) BY MOUTH AT BEDTIME. FOR APPETITE   ondansetron (ZOFRAN) 4 MG tablet Take 4 mg by mouth every 8 (eight) hours as needed for nausea or vomiting.   Propylene Glycol (SYSTANE COMPLETE OP) Place 1 drop into both eyes in the morning, at noon, in the evening, and at bedtime.   simvastatin  (ZOCOR ) 10 MG tablet TAKE 1 TABLET (10 MG TOTAL) BY MOUTH DAILY AT 12 NOON. OVERDUE FOR LABS   Timolol Maleate 0.5 % (DAILY) SOLN Place 1 drop into both eyes 2 (two) times daily.    cloNIDine  (CATAPRES ) 0.1 MG tablet Take 1 tablet (0.1 mg total) by mouth 2 (two) times daily. (Patient not taking: Reported on 05/19/2024)   hypromellose (GENTEAL) 0.3 % GEL ophthalmic ointment Place 1 Application into both eyes at bedtime. (Patient not taking: Reported on 05/19/2024)   polyethylene glycol (MIRALAX / GLYCOLAX) 17 g packet Take 17 g by mouth daily. (Patient not taking: Reported on 05/19/2024)   No facility-administered encounter medications on file as of 05/19/2024.    Review of Systems  Constitutional:  Negative for appetite change, chills, fatigue and fever.  HENT:  Negative for congestion, hearing loss, rhinorrhea  and sore throat.   Eyes: Negative.   Respiratory:  Negative for cough, shortness of breath and wheezing.   Cardiovascular:  Negative for chest pain, palpitations and leg swelling.  Gastrointestinal:  Negative for abdominal pain, constipation, diarrhea, nausea and vomiting.  Genitourinary:  Negative for dysuria.  Musculoskeletal:  Negative for arthralgias, back pain and myalgias.  Skin:  Negative for color change, rash and wound.  Neurological:  Negative for dizziness, weakness and headaches.  Psychiatric/Behavioral:  Negative for behavioral problems. The patient is not nervous/anxious.     Immunization History  Administered Date(s) Administered   DTaP 08/08/2015   Fluad Quad(high Dose 65+) 08/20/2021, 08/28/2022   Influenza Whole 08/06/2018   Influenza, High Dose Seasonal PF 08/13/2017, 08/21/2019, 08/16/2020, 08/06/2023   Influenza-Unspecified 08/02/2015, 08/15/2016   Moderna Covid-19 Vaccine  Bivalent Booster 6yrs & up 09/05/2022, 08/20/2023   Moderna Sars-Covid-2 Vaccination 11/08/2019, 12/06/2019, 09/12/2020   Pfizer Covid-19 Vaccine Bivalent Booster 107yrs & up 10/18/2021, 02/16/2022   Pneumococcal Conjugate-13 08/02/2015   Pneumococcal Polysaccharide-23 09/05/1999   Tdap 08/08/2015   Pertinent  Health Maintenance Due  Topic Date Due   INFLUENZA VACCINE  06/04/2024   DEXA SCAN  Completed      05/30/2022    1:57 PM 09/12/2022    9:23 AM 10/03/2022    9:06 AM 03/26/2023    2:59 PM 04/17/2023    8:37 AM  Fall Risk  Falls in the past year? 0 0 0 0 0  Was there an injury with Fall? 0 0 0 0 0  Fall Risk Category Calculator 0 0 0 0 0  Fall Risk Category (Retired) Low  Low  Low     (RETIRED) Patient Fall Risk Level Low fall risk  Low fall risk  Low fall risk     Patient at Risk for Falls Due to No Fall Risks No Fall Risks No Fall Risks No Fall Risks No Fall Risks  Fall risk Follow up Falls evaluation completed  Falls evaluation completed  Falls evaluation completed  Falls  evaluation completed Falls evaluation completed     Data saved with a previous flowsheet row definition     Vitals:   05/19/24 1038  BP: (!) 194/83  Pulse: 65  Resp: 18  Temp: 98.3 F (36.8 C)  SpO2: 96%  Weight: 118 lb 9.6 oz (53.8 kg)  Height: 5' 5 (1.651 m)   Body mass index is 19.74 kg/m.  Physical Exam Constitutional:      General: She is not in acute distress.    Appearance: Normal appearance.  HENT:     Head: Normocephalic and atraumatic.     Nose: Nose normal.     Mouth/Throat:     Mouth: Mucous membranes are moist.  Eyes:     Conjunctiva/sclera: Conjunctivae normal.  Cardiovascular:     Rate and Rhythm: Normal rate and regular rhythm.  Pulmonary:     Effort: Pulmonary effort is normal.     Breath sounds: Normal breath sounds.  Abdominal:     General: Bowel sounds are normal.     Palpations: Abdomen is soft.  Musculoskeletal:     Cervical back: Normal range of motion.  Skin:    General: Skin is warm and dry.  Psychiatric:        Mood and Affect: Mood normal.        Behavior: Behavior normal.        Labs reviewed: Recent Labs    08/26/23 0000  NA 141  K 4.4  CL 102  CO2 34*  BUN 13  CREATININE 0.9  CALCIUM 9.3   No results for input(s): AST, ALT, ALKPHOS, BILITOT, PROT, ALBUMIN in the last 8760 hours. No results for input(s): WBC, NEUTROABS, HGB, HCT, MCV, PLT in the last 8760 hours. Lab Results  Component Value Date   TSH 2.01 05/14/2022   Lab Results  Component Value Date   HGBA1C 5.6 05/17/2020   Lab Results  Component Value Date   CHOL 163 04/24/2023   HDL 61 04/24/2023   LDLCALC 83 04/24/2023   TRIG 101 04/24/2023   CHOLHDL 2.7 05/14/2022    Significant Diagnostic Results in last 30 days:  No results found.  Assessment/Plan  1. Benign hypertensive heart and CKD, stage 3 (GFR 30-59), w CHF (HCC) (Primary) -  BP 123/63, stable -  continue Hydralazine  25 mg TID -  continue Losartan  50 mg  daily -  monitor BP daily  2. Anxiety -  mood is stable -  continue Lexapro  20 mg daily     Family/ staff Communication: Discussed plan of care with resident, daughter and charge nurse.  Labs/tests ordered: None    Felissa Blouch Medina-Vargas, DNP, MSN, FNP-BC [facility] [phone] (Monday-Friday 8:00 a.m. - 5:00 p.m.) [phone] (after hours)

## 2024-05-19 NOTE — Telephone Encounter (Signed)
 BP today was 123/63, talked to daughter at bedside. Discussed with staff to notify provider when BP is high.

## 2024-05-25 DIAGNOSIS — E785 Hyperlipidemia, unspecified: Secondary | ICD-10-CM | POA: Diagnosis not present

## 2024-05-25 DIAGNOSIS — I10 Essential (primary) hypertension: Secondary | ICD-10-CM | POA: Diagnosis not present

## 2024-05-25 DIAGNOSIS — E039 Hypothyroidism, unspecified: Secondary | ICD-10-CM | POA: Diagnosis not present

## 2024-06-22 ENCOUNTER — Other Ambulatory Visit: Payer: Self-pay

## 2024-06-22 MED ORDER — HYDRALAZINE HCL 25 MG PO TABS: 25.0000 mg | ORAL_TABLET | Freq: Three times a day (TID) | ORAL | 3 refills | Status: DC

## 2024-06-29 NOTE — Progress Notes (Signed)
" °  Cardiology Office Note   Date:  06/30/2024  ID:  Angela [name], DOB 01/21/1927, MRN [id number] PCP: Mast, Man X, NP  [facility] Providers Cardiologist:  Alean SAUNDERS Madireddy, MD     History of Present Illness Angela [name] is a 89 y.o. female  with a past medical history of hypertension, CKD stage III, OSA, GERD, dyslipidemia, history of vocal cord paralysis.  She established care with Dr. Liborio in May 2025 for the management of her hypertension.  Losartan  50 mg was continued, as well as clonidine  0.1 mg as needed for systolic blood pressure greater than 180.   She presents today accompanied by her daughter for follow-up of her hypertension.  Her facility has been keeping track of it and her daughter presents a blood pressure log, it appears most of her blood pressures are checked around 11 AM and they are persistently elevated around 180 systolic.  The patient is completely asymptomatic and does not have any formal complaints. She denies chest pain, palpitations, dyspnea, pnd, orthopnea, n, v, dizziness, syncope, edema, weight gain, or early satiety.   ROS: Review of Systems  All other systems reviewed and are negative.    Studies Reviewed          Risk Assessment/Calculations           Physical Exam VS:  BP 134/70   Pulse 64   Ht 5' 5 (1.651 m)   Wt 123 lb 6.4 oz (56 kg)   SpO2 94%   BMI 20.53 kg/m        Wt Readings from Last 3 Encounters:  06/30/24 123 lb 6.4 oz (56 kg)  05/19/24 118 lb 9.6 oz (53.8 kg)  03/16/24 118 lb 6.4 oz (53.7 kg)    GEN: Well nourished, well developed in no acute distress NECK: No JVD; No carotid bruits CARDIAC: RRR, no murmurs, rubs, gallops RESPIRATORY:  Clear to auscultation without rales, wheezing or rhonchi  ABDOMEN: Soft, non-tender, non-distended EXTREMITIES:  No edema; No deformity   ASSESSMENT AND PLAN Hypertension - BP is well-controlled in the office today at 134/70 however blood pressure log from her facility  reveals it is typically very elevated.  She had blood work at her facility a few weeks ago revealing a creatinine 0.7 and normal potassium.  Continue hydralazine  25 mg 3 times daily, continue Cozaar  50 mg daily will add 25 mg in the evening to see if this will help with her elevated blood pressure readings around midmorning.  Continue clonidine  0.1 mg as needed for systolic blood pressure greater than 180.  Repeat BMET at her facility in 2 weeks.  Continue to keep a blood pressure log and send results to our office in a few weeks.  If her blood pressure is not well-controlled we could increase Cozaar  to 50 mg in the evening, or increase her hydralazine  dose.  Dyslipidemia-managed per PCP, currently on Zocor  10 mg daily.       Dispo: Add losartan  25 mg in the evening, repeat BMET in 2 weeks, continue to keep a blood pressure log and send us  a copy of her blood pressure readings in a few weeks.  Follow-up in 6 months.  Signed, Delon JAYSON Hoover, NP  "

## 2024-06-30 ENCOUNTER — Ambulatory Visit: Attending: Cardiology | Admitting: Cardiology

## 2024-06-30 ENCOUNTER — Encounter: Payer: Self-pay | Admitting: Cardiology

## 2024-06-30 VITALS — BP 134/70 | HR 64 | Ht 65.0 in | Wt 123.4 lb

## 2024-06-30 DIAGNOSIS — E782 Mixed hyperlipidemia: Secondary | ICD-10-CM | POA: Diagnosis not present

## 2024-06-30 DIAGNOSIS — I10 Essential (primary) hypertension: Secondary | ICD-10-CM

## 2024-06-30 MED ORDER — LOSARTAN POTASSIUM 25 MG PO TABS: 25.0000 mg | ORAL_TABLET | Freq: Every evening | ORAL | 3 refills | Status: AC

## 2024-06-30 NOTE — Patient Instructions (Addendum)
 Medication Instructions:  Your physician has recommended you make the following change in your medication:  Add Losartan  25 mg once in the evening in addition to the 50 mg in the morning.  *If you need a refill on your cardiac medications before your next appointment, please call your pharmacy*  Lab Work: Corporate treasurer Panel at Facility in 2 weeks If you have labs (blood work) drawn today and your tests are completely normal, you will receive your results only by: MyChart Message (if you have MyChart) OR A paper copy in the mail If you have any lab test that is abnormal or we need to change your treatment, we will call you to review the results.  Testing/Procedures: NONE  Follow-Up: At [facility], you and your health needs are our priority.  As part of our continuing mission to provide you with exceptional heart care, our providers are all part of one team.  This team includes your primary Cardiologist (physician) and Advanced Practice Providers or APPs (Physician Assistants and Nurse Practitioners) who all work together to provide you with the care you need, when you need it.  Your next appointment:   6 month(s)  Provider:   Alean Kobus, MD    We recommend signing up for the patient portal called MyChart.  Sign up information is provided on this After Visit Summary.  MyChart is used to connect with patients for Virtual Visits (Telemedicine).  Patients are able to view lab/test results, encounter notes, upcoming appointments, etc.  Non-urgent messages can be sent to your provider as well.   To learn more about what you can do with MyChart, go to [email or web address].   Other Instructions Please send BP readings through MyChart. Best time to take is 1-2 hours after medication and then again before dinner.

## 2024-07-14 DIAGNOSIS — H35352 Cystoid macular degeneration, left eye: Secondary | ICD-10-CM | POA: Diagnosis not present

## 2024-07-15 ENCOUNTER — Other Ambulatory Visit: Payer: Self-pay | Admitting: Nurse Practitioner

## 2024-07-15 DIAGNOSIS — I10 Essential (primary) hypertension: Secondary | ICD-10-CM | POA: Diagnosis not present

## 2024-07-15 LAB — LAB REPORT - SCANNED: EGFR: 39

## 2024-07-23 ENCOUNTER — Non-Acute Institutional Stay: Payer: Self-pay | Admitting: Nurse Practitioner

## 2024-07-23 ENCOUNTER — Ambulatory Visit: Admitting: Cardiology

## 2024-07-23 ENCOUNTER — Encounter: Payer: Self-pay | Admitting: Nurse Practitioner

## 2024-07-23 DIAGNOSIS — M818 Other osteoporosis without current pathological fracture: Secondary | ICD-10-CM | POA: Diagnosis not present

## 2024-07-23 DIAGNOSIS — J3800 Paralysis of vocal cords and larynx, unspecified: Secondary | ICD-10-CM | POA: Diagnosis not present

## 2024-07-23 DIAGNOSIS — R062 Wheezing: Secondary | ICD-10-CM

## 2024-07-23 DIAGNOSIS — F418 Other specified anxiety disorders: Secondary | ICD-10-CM | POA: Diagnosis not present

## 2024-07-23 NOTE — Assessment & Plan Note (Signed)
 Permissive for blood pressure control, followed by cardiologist,  takes Losartan , Hydralazine ,  ASA

## 2024-07-23 NOTE — Assessment & Plan Note (Signed)
 OP DEXA T score -4.3, declined Prolia, takes Vit D, Ca,  Vit D level 40 05/17/20,  off Alendronate  2nd to jaw bone necrotic process

## 2024-07-23 NOTE — Assessment & Plan Note (Signed)
 Stable, takes Lexapro and Mirtazapine, TSH 1.5 04/24/23

## 2024-07-23 NOTE — Assessment & Plan Note (Signed)
 takes Simvastatin . LDL 76 05/25/24

## 2024-07-23 NOTE — Assessment & Plan Note (Addendum)
 Bun/creat 24/1.27 07/15/24, followed by nephrologist, encourage oral hydration, repeat BMP 2 weeks.

## 2024-07-23 NOTE — Assessment & Plan Note (Signed)
 Stable, takes Omeprazole, Hgb 10.6 07/15/24

## 2024-07-23 NOTE — Progress Notes (Signed)
 Location:   [facility] Room Number: 915 Place of Service:  ALF (13) Provider: Larwance Patric Buckhalter NP  Chosen Geske X, NP  Patient Care Team: Alyne Martinson X, NP as PCP - General (Internal Medicine) Madireddy, Alean SAUNDERS, MD as PCP - Cardiology (Cardiology) Moya, Dempsey Pac, MD as Referring Physician (Ophthalmology) Joshua Blamer, MD as Consulting Physician (Dermatology) Brien Garnette Pepper, MD as Consulting Physician (Otolaryngology) Maranatha Grossi X, NP as Nurse Practitioner (Internal Medicine)  Extended Emergency Contact Information Primary Emergency Contact: Triplett,Daniel Mobile Phone: [phone] Relation: Son Secondary Emergency Contact: Dwain Pool Address: [address]          [place], [place] [address] [place] Home Phone: [phone] Mobile Phone: [phone] Relation: Daughter  Code Status:  DNR Goals of care: Advanced Directive information    05/19/2024   10:35 AM  Advanced Directives  Does Patient Have a Medical Advance Directive? Yes  Type of Estate agent of [place];Living will;Out of facility DNR (pink MOST or yellow form)  Does patient want to make changes to medical advance directive? No - Patient declined  Copy of Healthcare Power of Attorney in Chart? Yes - validated most recent copy scanned in chart (See row information)     Chief Complaint  Patient presents with  . Medical Management of Chronic Issues    HPI:  Pt is a 89 y.o. female seen today for medical management of chronic diseases.     No further left CVA region pain, CT abd: 1. Moderate to marked left hydronephrosis to the level of the UPJ without an obstructing stone or mass visualized. Underwent urology evaluation, f/u 6 months. 06/06/23 NM renal imaging, 1. Normal renal function. 2. No obstructive hydronephrosis.             Weight loss, stable, on Mirtazapine   HTN, takes Losartan , Hydralazine ,  ASA             CKD Bun/creat 24/1.27 07/15/24              Depression, takes Lexapro  and Mirtazapine , TSH 1.5 04/24/23             Hyperlipidemia, takes Simvastatin . LDL 76 05/25/24             OP DEXA T score -4.3, declined Prolia, takes Vit D, Ca,  Vit D level 40 05/17/20,  off Alendronate  2nd to jaw bone necrotic process             Vocal cord paralysis, underwent ENT evaluation in the past.              GERD, takes Omeprazole, Hgb 10.6 07/15/24             Glaucoma R+L follow Ophthalmology             Chronic wheezes at end of deep inspiration, off DuoNeb 2/2 little efficacy. 05/28/23 CXR no acute disease.         Past Medical History:  Diagnosis Date  . Age-related osteoporosis without fracture 09/22/2015   06/06/14 BMD T -2.7, off Alendronate  2nd to jaw bone necrotic process, current taking Vit D, Ca, will update Vit D level.  Due 06/2016    . Allergic rhinitis 09/22/2015  . Anemia   . Anxiety   . Apnea 05/2000   sleep study revealed mild upper airway obstruction & apnea during speel, no set recommendations for therapy  . Asthma   . Baker cyst, right 06/04/2022  . CKD (chronic kidney disease) stage  3, GFR 30-59 ml/min (HCC) 05/11/2021   05/10/21 Na 142, K 4.4, Bun 23, creat 1.0, eGFR 44  11/06/21 Na 144, K 4.1, Bun 18, creat 1.03, eGFR 51, wbc 4.6, Hgb 13.2, Plt 188, neutrophils 48.6%  05/29/23 Na 135, K 4.2, Bun 25, creat 1.35, wbc 8.1, Hgb 12.7, plt 214, neutrophils 76.5%    . Diverticulosis   . Dry eyes 04/21/2018  . Dyspnea 12/15/2009   spirometer normal & function actually worsens with nebk may have more of a exercise-induced type of bronchospasm   . Entropion eyelid, left 03/18/2019  . Entropion of lower eyelid 02/21/2014  . Entropion, spastic 12/26/2013   Overview:   RLL    . Exposure keratitis 12/26/2013   Overview:   Mechanical lashes on K OD    . Fatigue 07/2015  . GERD (gastroesophageal reflux disease)   . Glaucoma    both eyes  . H/O nonmelanoma skin cancer 06/03/2012  . H/O vocal cord paralysis 06/03/2012  . HTN (hypertension)  09/22/2015   11/17/18 creat 1.04, Na 143, K 4.2  03/15/19 Na 144, K 4.1, Bun 24, creat 1.02  07/06/19 Vit D 46, cholesterol 177, HDL 59, Triglycerides 155, LDL 93, TSH 1.29, Na 142, K 4.4, Bun 23, creat 0.97, eGFR 52, wbc 4.1, Hgb 13,0, plt 183  12/17/19 Hgb a1c 5.8, cholesterol 170, LDL 88, TSH 1.58, Na 143, K 4.2, Bun 24, creat 0.95, wbc 5.0, Hgb 13.1,plt 223  11/30/20 TSH 1.82, LDL 87, Na 140, K 4.6, Bun 19, crea  . Hydronephrosis, left 05/28/2023   06/06/23 NM renal imaging, 1. Normal renal function.  2. No obstructive hydronephrosis.    . Hyperlipidemia   . Hypertension   . Laryngeal paresis 09/12/2016  . Leukocytosis 05/28/2023  . Loss of weight 06/20/2016  . Mild cognitive impairment 09/01/2023   11/30/20 TSH 1.82  04/22/23 MMSE 30/30  09/01/23 MMSE 28/30    . 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 Obstructive sleep apnea 09/22/2015   Underwent neurology eval, done sleep study, dx mild upper airway obstruction and apnea. No set recommended.     . OSA (obstructive sleep apnea)   . Osteoarthritis 09/22/2015   Multiple sites, R+L knees, fingers    . Osteoporosis, senile   . Primary open angle glaucoma (POAG) of both eyes 04/21/2018  . Pseudophakia 06/03/2012   04/23/23 Ophthalmology Systane  06/18/23 macular edema ophtho, OS    . Reflux laryngitis 09/22/2015   Underwent naso laryngoscopy, ENT     . Vocal cord dysfunction    per ENT, pt NOT to have elective intubation without disscussing with him  . Vocal fold paralysis, bilateral 09/22/2015   Needs a size 4.0 ETT should she ever require intubation per Dr. CANDIE Franchot Silvan, ENT, [facility]  12/03/23 bilateral vocal cord paralysis, stable    . Weight loss 06/20/2016  . Wheezes 05/28/2023   05/28/23 CXR no acute disease.      Past Surgical History:  Procedure Laterality Date  . CATARACT EXTRACTION W/ INTRAOCULAR LENS  IMPLANT, BILATERAL  2011   Dr. Carrie  . CERVICAL CONIZATION W/BX     due to cervical dysplasia  . COLONOSCOPY  02/2002   Diverticublosis    . EYE SURGERY Right 02/2016   laser surgery for scar tissure  . nasolaryngoscopy  2001   reflux laryngitis  . SKIN CANCER EXCISION     ?BCC  . TONSILLECTOMY    . UPPER GI ENDOSCOPY  05/2000   upper airway edema/changes that were consistent  with GERD  . vocal fold cordotomy Left 2003   Dr. Franchot Silvan [facility]    Allergies  Allergen Reactions  . Alphagan [Brimonidine] Other (See Comments)  . Dorzolamide Hcl Itching    EYE PAIN  . Trusopt [Dorzolamide]     Allergies as of 07/23/2024       Reactions   Alphagan [brimonidine] Other (See Comments)   Dorzolamide Hcl Itching   EYE PAIN   Trusopt [dorzolamide]         Medication List        Accurate as of July 23, 2024 11:59 PM. If you have any questions, ask your nurse or doctor.          ascorbic acid 500 MG tablet Commonly known as: VITAMIN C Take 500 mg by mouth daily.   aspirin EC 81 MG tablet Take 81 mg by mouth daily.   brinzolamide 1 % ophthalmic suspension Commonly known as: AZOPT Place 1 drop into both eyes 3 (three) times daily.   Calcium 600+D 600-10 MG-MCG Tabs Generic drug: Calcium Carb-Cholecalciferol Take 1 tablet by mouth daily.   escitalopram  20 MG tablet Commonly known as: LEXAPRO  Take 20 mg by mouth daily.   hydrALAZINE  25 MG tablet Commonly known as: APRESOLINE  Take 1 tablet (25 mg total) by mouth 3 (three) times daily.   hypromellose 0.3 % Gel ophthalmic ointment Commonly known as: GENTEAL Place 1 Application into both eyes at bedtime.   ketorolac 0.4 % Soln Commonly known as: ACULAR Place 1 drop into the left eye daily.   losartan  50 MG tablet Commonly known as: COZAAR  Take 1 tablet (50 mg total) by mouth daily.   losartan  25 MG tablet Commonly known as: COZAAR  Take 1 tablet (25 mg total) by mouth every evening. In addition to the 50 mg taken during the day   loteprednol 0.2 % Susp Commonly known as: LOTEMAX Place 1 drop into the left eye 2 (two) times daily.    MILK OF MAGNESIA PO Take 30 mLs by mouth as needed.   mirtazapine  7.5 MG tablet Commonly known as: REMERON  TAKE 1 TABLET (7.5 MG TOTAL) BY MOUTH AT BEDTIME. FOR APPETITE   ondansetron 4 MG tablet Commonly known as: ZOFRAN Take 4 mg by mouth every 8 (eight) hours as needed for nausea or vomiting.   polyethylene glycol 17 g packet Commonly known as: MIRALAX / GLYCOLAX Take 17 g by mouth daily.   Rocklatan  0.02-0.005 % Soln Generic drug: Netarsudil-Latanoprost   simvastatin  10 MG tablet Commonly known as: ZOCOR  TAKE 1 TABLET (10 MG TOTAL) BY MOUTH DAILY AT 12 NOON. OVERDUE FOR LABS   SYSTANE COMPLETE OP Place 1 drop into both eyes in the morning, at noon, in the evening, and at bedtime.   Timolol Maleate (Once-Daily) 0.5 % Soln Place 1 drop into both eyes 2 (two) times daily.        Review of Systems  Constitutional:  Negative for appetite change, fatigue and fever.  HENT:  Positive for hearing loss. Negative for congestion and trouble swallowing.        Vocal cord paralysis.  Raspy voice  Eyes:  Positive for visual disturbance.  Respiratory:  Positive for wheezing. Negative for cough.   Cardiovascular:  Negative for leg swelling.  Gastrointestinal:  Negative for abdominal pain and constipation.  Genitourinary:  Negative for dysuria and urgency.  Musculoskeletal:  Positive for arthralgias and gait problem.  Skin:  Negative for color change.  Neurological:  Negative for speech difficulty, weakness  and light-headedness.       Memory lapses.   Psychiatric/Behavioral:  Negative for confusion and sleep disturbance. The patient is not nervous/anxious.     Immunization History  Administered Date(s) Administered  . DTaP 08/08/2015  . Fluad Quad(high Dose 65+) 08/20/2021, 08/28/2022  . INFLUENZA, HIGH DOSE SEASONAL PF 08/13/2017, 08/21/2019, 08/16/2020, 08/06/2023  . Influenza Whole 08/06/2018  . Influenza-Unspecified 08/02/2015, 08/15/2016  . Moderna Covid-19 Vaccine   Bivalent Booster 53yrs & up 09/05/2022, 08/20/2023  . Moderna Sars-Covid-2 Vaccination 11/08/2019, 12/06/2019, 09/12/2020  . Pfizer Covid-19 Vaccine Bivalent Booster 44yrs & up 10/18/2021, 02/16/2022  . Pneumococcal Conjugate-13 08/02/2015  . Pneumococcal Polysaccharide-23 09/05/1999  . Tdap 08/08/2015   Pertinent  Health Maintenance Due  Topic Date Due  . Influenza Vaccine  06/04/2024  . DEXA SCAN  Completed      05/30/2022    1:57 PM 09/12/2022    9:23 AM 10/03/2022    9:06 AM 03/26/2023    2:59 PM 04/17/2023    8:37 AM  Fall Risk  Falls in the past year? 0 0 0 0 0  Was there an injury with Fall? 0 0 0 0 0  Fall Risk Category Calculator 0 0 0 0 0  Fall Risk Category (Retired) Low  Low  Low     (RETIRED) Patient Fall Risk Level Low fall risk  Low fall risk  Low fall risk     Patient at Risk for Falls Due to No Fall Risks No Fall Risks No Fall Risks No Fall Risks No Fall Risks  Fall risk Follow up Falls evaluation completed  Falls evaluation completed  Falls evaluation completed  Falls evaluation completed Falls evaluation completed     Data saved with a previous flowsheet row definition   Functional Status Survey:    Vitals:   07/23/24 1114  BP: (!) 157/65  Pulse: 64  Resp: 18  Temp: (!) 96.9 F (36.1 C)  SpO2: 94%  Weight: 126 lb (57.2 kg)   Body mass index is 20.97 kg/m. Physical Exam Vitals reviewed.  Constitutional:      Appearance: Normal appearance.  HENT:     Head: Normocephalic and atraumatic.     Nose: Nose normal.     Mouth/Throat:     Mouth: Mucous membranes are moist.     Comments: Torus plantinus Eyes:     Extraocular Movements: Extraocular movements intact.     Conjunctiva/sclera: Conjunctivae normal.     Pupils: Pupils are equal, round, and reactive to light.     Comments: Left lower eyelid entropion, Hx of left corneal ulceration, R+L glaucoma, able to read  Cardiovascular:     Rate and Rhythm: Normal rate and regular rhythm.     Heart  sounds: No murmur heard. Pulmonary:     Effort: Pulmonary effort is normal.     Breath sounds: Wheezing present. No rales.     Comments: Diffused wheezes is chronic Abdominal:     General: Bowel sounds are normal.     Palpations: Abdomen is soft.     Tenderness: There is no abdominal tenderness.  Musculoskeletal:     Cervical back: Normal range of motion and neck supple.     Right lower leg: No edema.     Left lower leg: No edema.     Comments:    Skin:    General: Skin is warm and dry.     Findings: Lesion present.     Comments: Scratched skin lesion with dark base under the  left eye next to the nasal bridge, no s/s of active bleeding or injection.   Neurological:     General: No focal deficit present.     Mental Status: She is alert. Mental status is at baseline.     Gait: Gait normal.     Comments: Oriented to person and place. Able to walk  Psychiatric:        Mood and Affect: Mood normal.        Behavior: Behavior normal.        Thought Content: Thought content normal.        Judgment: Judgment normal.     Labs reviewed: Recent Labs    08/26/23 0000  NA 141  K 4.4  CL 102  CO2 34*  BUN 13  CREATININE 0.9  CALCIUM 9.3   No results for input(s): AST, ALT, ALKPHOS, BILITOT, PROT, ALBUMIN in the last 8760 hours. No results for input(s): WBC, NEUTROABS, HGB, HCT, MCV, PLT in the last 8760 hours. Lab Results  Component Value Date   TSH 2.01 05/14/2022   Lab Results  Component Value Date   HGBA1C 5.6 05/17/2020   Lab Results  Component Value Date   CHOL 163 04/24/2023   HDL 61 04/24/2023   LDLCALC 83 04/24/2023   TRIG 101 04/24/2023   CHOLHDL 2.7 05/14/2022    Significant Diagnostic Results in last 30 days:  No results found.  Assessment/Plan  HTN (hypertension) Permissive for blood pressure control, followed by cardiologist,  takes Losartan , Hydralazine ,  ASA  CKD (chronic kidney disease) stage 3, GFR 30-59 ml/min  (HCC) Bun/creat 24/1.27 07/15/24, followed by nephrologist, encourage oral hydration, repeat BMP 2 weeks.   Depression with anxiety Stable, takes Lexapro  and Mirtazapine , TSH 1.5 04/24/23  Hyperlipidemia takes Simvastatin . LDL 76 05/25/24  Age-related osteoporosis without fracture OP DEXA T score -4.3, declined Prolia, takes Vit D, Ca,  Vit D level 40 05/17/20,  off Alendronate  2nd to jaw bone necrotic process  Laryngeal paresis underwent ENT evaluation in the past.   GERD (gastroesophageal reflux disease) Stable, takes Omeprazole, Hgb 10.6 07/15/24  Glaucoma R+L follow Ophthalmology  Wheezes Chronic wheezes at end of deep inspiration, off DuoNeb 2/2 little efficacy. 05/28/23 CXR no acute disease.    Family/ staff Communication: Plan of care reviewed with the patient, the patient's daughter and son-H POA, Child psychotherapist, and Press photographer  Labs/tests ordered: BMP 2 weeks.

## 2024-07-23 NOTE — Assessment & Plan Note (Signed)
underwent ENT evaluation in the past.  

## 2024-07-23 NOTE — Assessment & Plan Note (Signed)
 R+L follow Ophthalmology

## 2024-07-23 NOTE — Assessment & Plan Note (Signed)
Chronic wheezes at end of deep inspiration, off DuoNeb 2/2 little efficacy. 05/28/23 CXR no acute disease.

## 2024-08-05 DIAGNOSIS — I10 Essential (primary) hypertension: Secondary | ICD-10-CM | POA: Diagnosis not present

## 2024-08-08 ENCOUNTER — Encounter: Payer: Self-pay | Admitting: Sports Medicine

## 2024-08-09 NOTE — Telephone Encounter (Signed)
 Assisted living resident. Please reply directly to patients/family member

## 2024-08-10 ENCOUNTER — Telehealth: Payer: Self-pay | Admitting: Cardiology

## 2024-08-10 DIAGNOSIS — N183 Chronic kidney disease, stage 3 unspecified: Secondary | ICD-10-CM | POA: Diagnosis not present

## 2024-08-10 DIAGNOSIS — I10 Essential (primary) hypertension: Secondary | ICD-10-CM | POA: Diagnosis not present

## 2024-08-10 DIAGNOSIS — M6281 Muscle weakness (generalized): Secondary | ICD-10-CM | POA: Diagnosis not present

## 2024-08-10 MED ORDER — HYDRALAZINE HCL 50 MG PO TABS: 50.0000 mg | ORAL_TABLET | Freq: Three times a day (TID) | ORAL | 3 refills | Status: AC

## 2024-08-10 NOTE — Telephone Encounter (Signed)
 BP log received and reviewed.  Increase Hydralazine  to 50 mg TID.

## 2024-08-10 NOTE — Addendum Note (Signed)
 Addended by: ARLOA PLANAS D on: 08/10/2024 08:48 AM   Modules accepted: Orders

## 2024-08-10 NOTE — Telephone Encounter (Addendum)
 Left message on VM for Angela [name] per DPR to increase Hydralazine  to 50mg  TID. Encouraged to call with any questions.

## 2024-08-11 ENCOUNTER — Telehealth: Payer: Self-pay

## 2024-08-11 NOTE — Telephone Encounter (Signed)
 Order has been sent to [facility] as requested.

## 2024-08-11 NOTE — Telephone Encounter (Signed)
 Renea is following up. She says orders have been received but they need a signature from MD or NP.  Fax#: [phone]

## 2024-08-11 NOTE — Telephone Encounter (Signed)
 Angela [name] with [facility] requesting callback to clarify orders that were sent. Please advise.

## 2024-08-11 NOTE — Telephone Encounter (Signed)
  Nena is calling to follow up. She said the care facility where the patient is staying, they haven't receive the fax about the patient hydralazine . She requested to fax it to [phone] ATTN: Pamila Shove

## 2024-08-11 NOTE — Telephone Encounter (Signed)
 Confirmed that orders had been received by Tomeka.

## 2024-08-16 DIAGNOSIS — H401122 Primary open-angle glaucoma, left eye, moderate stage: Secondary | ICD-10-CM | POA: Diagnosis not present

## 2024-08-16 DIAGNOSIS — H401113 Primary open-angle glaucoma, right eye, severe stage: Secondary | ICD-10-CM | POA: Diagnosis not present

## 2024-08-16 DIAGNOSIS — Z961 Presence of intraocular lens: Secondary | ICD-10-CM | POA: Diagnosis not present

## 2024-08-16 DIAGNOSIS — H209 Unspecified iridocyclitis: Secondary | ICD-10-CM | POA: Diagnosis not present

## 2024-08-17 DIAGNOSIS — I10 Essential (primary) hypertension: Secondary | ICD-10-CM | POA: Diagnosis not present

## 2024-09-08 DIAGNOSIS — H3581 Retinal edema: Secondary | ICD-10-CM | POA: Diagnosis not present

## 2024-09-08 DIAGNOSIS — H35352 Cystoid macular degeneration, left eye: Secondary | ICD-10-CM | POA: Diagnosis not present

## 2024-09-08 DIAGNOSIS — H35373 Puckering of macula, bilateral: Secondary | ICD-10-CM | POA: Diagnosis not present

## 2024-09-20 DIAGNOSIS — N1330 Unspecified hydronephrosis: Secondary | ICD-10-CM | POA: Diagnosis not present

## 2024-09-21 DIAGNOSIS — D485 Neoplasm of uncertain behavior of skin: Secondary | ICD-10-CM | POA: Diagnosis not present

## 2024-09-21 DIAGNOSIS — D225 Melanocytic nevi of trunk: Secondary | ICD-10-CM | POA: Diagnosis not present

## 2024-09-21 DIAGNOSIS — L820 Inflamed seborrheic keratosis: Secondary | ICD-10-CM | POA: Diagnosis not present

## 2024-09-21 DIAGNOSIS — D2271 Melanocytic nevi of right lower limb, including hip: Secondary | ICD-10-CM | POA: Diagnosis not present

## 2024-09-21 DIAGNOSIS — L723 Sebaceous cyst: Secondary | ICD-10-CM | POA: Diagnosis not present

## 2024-09-21 DIAGNOSIS — L821 Other seborrheic keratosis: Secondary | ICD-10-CM | POA: Diagnosis not present

## 2024-09-21 DIAGNOSIS — B078 Other viral warts: Secondary | ICD-10-CM | POA: Diagnosis not present

## 2024-10-15 DIAGNOSIS — Z23 Encounter for immunization: Secondary | ICD-10-CM | POA: Diagnosis not present

## 2024-10-19 DIAGNOSIS — H0102A Squamous blepharitis right eye, upper and lower eyelids: Secondary | ICD-10-CM | POA: Diagnosis not present

## 2024-10-19 DIAGNOSIS — H52223 Regular astigmatism, bilateral: Secondary | ICD-10-CM | POA: Diagnosis not present

## 2024-10-19 DIAGNOSIS — H5211 Myopia, right eye: Secondary | ICD-10-CM | POA: Diagnosis not present

## 2024-10-19 DIAGNOSIS — H43813 Vitreous degeneration, bilateral: Secondary | ICD-10-CM | POA: Diagnosis not present

## 2024-10-19 DIAGNOSIS — H524 Presbyopia: Secondary | ICD-10-CM | POA: Diagnosis not present

## 2024-10-19 DIAGNOSIS — H401134 Primary open-angle glaucoma, bilateral, indeterminate stage: Secondary | ICD-10-CM | POA: Diagnosis not present

## 2024-10-19 DIAGNOSIS — H0102B Squamous blepharitis left eye, upper and lower eyelids: Secondary | ICD-10-CM | POA: Diagnosis not present

## 2024-12-02 ENCOUNTER — Telehealth: Payer: Self-pay

## 2024-12-02 NOTE — Telephone Encounter (Signed)
 The patient's daughter Angela [name] returned our call and she was informed of the recommendation of the patient's blood pressure log results below:  Blood pressure log from 10/21/2024 through 11/14/2024, reasonably well-controlled blood pressures ranging from systolic 110s to 839d and diastolic 60s to 70s with few outliers.  Overall reasonably well-controlled.  Please inform patient or her daughter that the blood pressure log has been reviewed.  Thank you  Angela [name] verbalized understanding and had no further questions at this time.

## 2024-12-02 NOTE — Telephone Encounter (Signed)
 The patient's daughter was informed of the patient's blood pressure log results. The patient's daughter verbalized understanding and had no further questions at this time.

## 2024-12-02 NOTE — Telephone Encounter (Signed)
 Daughter was returning call. Please advise  ?

## 2024-12-02 NOTE — Telephone Encounter (Signed)
 Received the following message from Dr. Liborio below:  Blood pressure log from 10/21/2024 through 11/14/2024, reasonably well-controlled blood pressures ranging from systolic 110s to 839d and diastolic 60s to 70s with few outliers.  Overall reasonably well-controlled.  Please inform patient or her daughter that the blood pressure log has been reviewed.  Thank you  Called the patient to inform her of the interpretation of her blood pressure log. Patient did not answer and a message was left for the patient to call back.

## 2024-12-09 NOTE — Telephone Encounter (Signed)
Left message for Sandra to call back.

## 2024-12-09 NOTE — Telephone Encounter (Signed)
 Blood pressure log sent for review from dates 11/10/2024 through 11/29/2024 notes relatively well-controlled blood pressures with a single outlier on 11/23/2024.  Blood pressures consistently below 160s systolic. Continue current plan no changes.

## 2024-12-10 NOTE — Telephone Encounter (Signed)
 Left message for the patient to call back.
